# Patient Record
Sex: Female | Born: 1935 | Race: White | Hispanic: No | Smoking: Never smoker
Health system: Southern US, Community
[De-identification: ages and names within clinical notes are randomized; demographics above are authoritative.]

## PROBLEM LIST (undated history)

## (undated) DIAGNOSIS — S22000A Wedge compression fracture of unspecified thoracic vertebra, initial encounter for closed fracture: Secondary | ICD-10-CM

## (undated) DIAGNOSIS — F0390 Unspecified dementia without behavioral disturbance: Secondary | ICD-10-CM

## (undated) DIAGNOSIS — M546 Pain in thoracic spine: Secondary | ICD-10-CM

## (undated) DIAGNOSIS — N390 Urinary tract infection, site not specified: Secondary | ICD-10-CM

## (undated) DIAGNOSIS — S8010XA Contusion of unspecified lower leg, initial encounter: Secondary | ICD-10-CM

## (undated) DIAGNOSIS — E785 Hyperlipidemia, unspecified: Secondary | ICD-10-CM

## (undated) DIAGNOSIS — J189 Pneumonia, unspecified organism: Secondary | ICD-10-CM

## (undated) DIAGNOSIS — T148XXA Other injury of unspecified body region, initial encounter: Secondary | ICD-10-CM

## (undated) DIAGNOSIS — J449 Chronic obstructive pulmonary disease, unspecified: Secondary | ICD-10-CM

## (undated) DIAGNOSIS — I10 Essential (primary) hypertension: Secondary | ICD-10-CM

## (undated) DIAGNOSIS — R0789 Other chest pain: Secondary | ICD-10-CM

## (undated) DIAGNOSIS — J90 Pleural effusion, not elsewhere classified: Secondary | ICD-10-CM

## (undated) DIAGNOSIS — R569 Unspecified convulsions: Secondary | ICD-10-CM

## (undated) DIAGNOSIS — C4490 Unspecified malignant neoplasm of skin, unspecified: Secondary | ICD-10-CM

## (undated) DIAGNOSIS — M1990 Unspecified osteoarthritis, unspecified site: Secondary | ICD-10-CM

## (undated) DIAGNOSIS — D649 Anemia, unspecified: Secondary | ICD-10-CM

## (undated) DIAGNOSIS — M4316 Spondylolisthesis, lumbar region: Secondary | ICD-10-CM

## (undated) DIAGNOSIS — G40909 Epilepsy, unspecified, not intractable, without status epilepticus: Secondary | ICD-10-CM

## (undated) HISTORY — DX: Contusion of unspecified lower leg, initial encounter: S80.10XA

## (undated) HISTORY — DX: Other injury of unspecified body region, initial encounter: T14.8XXA

## (undated) HISTORY — PX: GLAUCOMA SURGERY: SHX656

## (undated) HISTORY — DX: Urinary tract infection, site not specified: N39.0

## (undated) HISTORY — DX: Spondylolisthesis, lumbar region: M43.16

## (undated) HISTORY — DX: Epilepsy, unspecified, not intractable, without status epilepticus: G40.909

## (undated) HISTORY — DX: Pneumonia, unspecified organism: J18.9

## (undated) HISTORY — DX: Essential (primary) hypertension: I10

## (undated) HISTORY — PX: LUMBAR DISC SURGERY: SHX700

## (undated) HISTORY — PX: ABDOMINAL HYSTERECTOMY: SHX81

## (undated) HISTORY — PX: EYE SURGERY: SHX253

## (undated) HISTORY — PX: BREAST LUMPECTOMY: SHX2

## (undated) HISTORY — DX: Anemia, unspecified: D64.9

## (undated) HISTORY — DX: Hyperlipidemia, unspecified: E78.5

## (undated) HISTORY — PX: CATARACT EXTRACTION W/ INTRAOCULAR LENS IMPLANT: SHX1309

## (undated) HISTORY — DX: Unspecified malignant neoplasm of skin, unspecified: C44.90

---

## 2004-12-26 ENCOUNTER — Encounter: Admission: RE | Admit: 2004-12-26 | Discharge: 2004-12-26 | Payer: Self-pay | Admitting: Specialist

## 2004-12-27 ENCOUNTER — Encounter: Admission: RE | Admit: 2004-12-27 | Discharge: 2004-12-27 | Payer: Self-pay | Admitting: Urology

## 2004-12-30 ENCOUNTER — Ambulatory Visit (HOSPITAL_BASED_OUTPATIENT_CLINIC_OR_DEPARTMENT_OTHER): Admission: RE | Admit: 2004-12-30 | Discharge: 2004-12-30 | Payer: Self-pay | Admitting: Urology

## 2004-12-30 ENCOUNTER — Encounter (INDEPENDENT_AMBULATORY_CARE_PROVIDER_SITE_OTHER): Payer: Self-pay | Admitting: Specialist

## 2004-12-30 ENCOUNTER — Ambulatory Visit (HOSPITAL_COMMUNITY): Admission: RE | Admit: 2004-12-30 | Discharge: 2004-12-30 | Payer: Self-pay | Admitting: Urology

## 2006-10-04 ENCOUNTER — Encounter: Admission: RE | Admit: 2006-10-04 | Discharge: 2006-10-04 | Payer: Self-pay | Admitting: Orthopaedic Surgery

## 2007-05-05 ENCOUNTER — Encounter: Admission: RE | Admit: 2007-05-05 | Discharge: 2007-05-05 | Payer: Self-pay | Admitting: Orthopedic Surgery

## 2007-06-09 ENCOUNTER — Emergency Department (HOSPITAL_COMMUNITY): Admission: EM | Admit: 2007-06-09 | Discharge: 2007-06-09 | Payer: Self-pay | Admitting: Family Medicine

## 2008-07-16 ENCOUNTER — Inpatient Hospital Stay (HOSPITAL_COMMUNITY): Admission: EM | Admit: 2008-07-16 | Discharge: 2008-07-21 | Payer: Self-pay | Admitting: Emergency Medicine

## 2008-07-16 ENCOUNTER — Encounter: Payer: Self-pay | Admitting: Internal Medicine

## 2008-07-16 ENCOUNTER — Ambulatory Visit: Payer: Self-pay | Admitting: Internal Medicine

## 2008-07-16 ENCOUNTER — Ambulatory Visit: Payer: Self-pay | Admitting: Cardiovascular Disease

## 2008-07-17 ENCOUNTER — Encounter: Payer: Self-pay | Admitting: Internal Medicine

## 2008-07-17 ENCOUNTER — Ambulatory Visit: Payer: Self-pay | Admitting: Vascular Surgery

## 2008-07-17 ENCOUNTER — Encounter (INDEPENDENT_AMBULATORY_CARE_PROVIDER_SITE_OTHER): Payer: Self-pay | Admitting: Internal Medicine

## 2008-07-20 ENCOUNTER — Encounter: Payer: Self-pay | Admitting: Internal Medicine

## 2008-07-20 ENCOUNTER — Telehealth: Payer: Self-pay | Admitting: Internal Medicine

## 2008-07-21 ENCOUNTER — Encounter: Payer: Self-pay | Admitting: Internal Medicine

## 2008-07-23 ENCOUNTER — Ambulatory Visit: Payer: Self-pay | Admitting: Internal Medicine

## 2008-07-23 DIAGNOSIS — Z862 Personal history of diseases of the blood and blood-forming organs and certain disorders involving the immune mechanism: Secondary | ICD-10-CM

## 2008-07-23 DIAGNOSIS — E785 Hyperlipidemia, unspecified: Secondary | ICD-10-CM

## 2008-07-23 DIAGNOSIS — J13 Pneumonia due to Streptococcus pneumoniae: Secondary | ICD-10-CM | POA: Insufficient documentation

## 2008-07-23 DIAGNOSIS — I10 Essential (primary) hypertension: Secondary | ICD-10-CM | POA: Insufficient documentation

## 2008-07-23 DIAGNOSIS — Z8709 Personal history of other diseases of the respiratory system: Secondary | ICD-10-CM | POA: Insufficient documentation

## 2008-07-23 DIAGNOSIS — R32 Unspecified urinary incontinence: Secondary | ICD-10-CM

## 2008-07-23 DIAGNOSIS — M129 Arthropathy, unspecified: Secondary | ICD-10-CM | POA: Insufficient documentation

## 2008-07-23 DIAGNOSIS — N39498 Other specified urinary incontinence: Secondary | ICD-10-CM | POA: Insufficient documentation

## 2008-07-23 DIAGNOSIS — R569 Unspecified convulsions: Secondary | ICD-10-CM

## 2008-07-23 DIAGNOSIS — Z87448 Personal history of other diseases of urinary system: Secondary | ICD-10-CM | POA: Insufficient documentation

## 2008-07-23 DIAGNOSIS — H409 Unspecified glaucoma: Secondary | ICD-10-CM | POA: Insufficient documentation

## 2008-07-23 DIAGNOSIS — K224 Dyskinesia of esophagus: Secondary | ICD-10-CM | POA: Insufficient documentation

## 2008-07-23 DIAGNOSIS — M810 Age-related osteoporosis without current pathological fracture: Secondary | ICD-10-CM | POA: Insufficient documentation

## 2008-07-23 DIAGNOSIS — D649 Anemia, unspecified: Secondary | ICD-10-CM | POA: Insufficient documentation

## 2008-07-23 DIAGNOSIS — F068 Other specified mental disorders due to known physiological condition: Secondary | ICD-10-CM

## 2008-07-27 LAB — CONVERTED CEMR LAB
BUN: 15 mg/dL (ref 6–23)
Basophils Absolute: 0 10*3/uL (ref 0.0–0.1)
Basophils Relative: 0.1 % (ref 0.0–3.0)
CO2: 30 meq/L (ref 19–32)
Calcium: 9.5 mg/dL (ref 8.4–10.5)
Eosinophils Relative: 1.9 % (ref 0.0–5.0)
GFR calc Af Amer: 150 mL/min
Glucose, Bld: 73 mg/dL (ref 70–99)
Hemoglobin: 11.9 g/dL — ABNORMAL LOW (ref 12.0–15.0)
Lymphocytes Relative: 10.9 % — ABNORMAL LOW (ref 12.0–46.0)
Monocytes Absolute: 0.4 10*3/uL (ref 0.1–1.0)
Neutrophils Relative %: 81 % — ABNORMAL HIGH (ref 43.0–77.0)
Platelets: 262 10*3/uL (ref 150–400)
Potassium: 3.8 meq/L (ref 3.5–5.1)
RDW: 13.8 % (ref 11.5–14.6)
Sodium: 135 meq/L (ref 135–145)

## 2008-08-04 ENCOUNTER — Encounter: Payer: Self-pay | Admitting: Internal Medicine

## 2008-08-05 ENCOUNTER — Encounter: Payer: Self-pay | Admitting: Internal Medicine

## 2008-08-06 ENCOUNTER — Ambulatory Visit: Payer: Self-pay | Admitting: Internal Medicine

## 2008-08-19 ENCOUNTER — Ambulatory Visit: Payer: Self-pay | Admitting: Internal Medicine

## 2008-08-31 ENCOUNTER — Encounter: Payer: Self-pay | Admitting: Internal Medicine

## 2008-09-01 ENCOUNTER — Encounter: Payer: Self-pay | Admitting: Internal Medicine

## 2008-09-01 ENCOUNTER — Telehealth: Payer: Self-pay | Admitting: Internal Medicine

## 2008-09-03 ENCOUNTER — Ambulatory Visit: Payer: Self-pay | Admitting: Internal Medicine

## 2008-09-15 ENCOUNTER — Telehealth: Payer: Self-pay | Admitting: *Deleted

## 2008-09-23 ENCOUNTER — Encounter: Payer: Self-pay | Admitting: Internal Medicine

## 2008-10-01 ENCOUNTER — Ambulatory Visit: Payer: Self-pay | Admitting: Internal Medicine

## 2008-10-21 ENCOUNTER — Ambulatory Visit: Payer: Self-pay | Admitting: Internal Medicine

## 2008-10-21 DIAGNOSIS — L259 Unspecified contact dermatitis, unspecified cause: Secondary | ICD-10-CM

## 2008-10-21 DIAGNOSIS — J310 Chronic rhinitis: Secondary | ICD-10-CM | POA: Insufficient documentation

## 2008-11-03 ENCOUNTER — Ambulatory Visit: Payer: Self-pay | Admitting: Internal Medicine

## 2008-11-03 ENCOUNTER — Telehealth: Payer: Self-pay | Admitting: Internal Medicine

## 2008-11-03 DIAGNOSIS — R05 Cough: Secondary | ICD-10-CM

## 2008-11-03 DIAGNOSIS — R509 Fever, unspecified: Secondary | ICD-10-CM

## 2008-11-27 ENCOUNTER — Telehealth: Payer: Self-pay | Admitting: Internal Medicine

## 2008-11-27 ENCOUNTER — Ambulatory Visit: Payer: Self-pay | Admitting: Internal Medicine

## 2008-11-27 LAB — CONVERTED CEMR LAB
Nitrite: NEGATIVE
Specific Gravity, Urine: 1.01

## 2008-11-28 ENCOUNTER — Encounter: Payer: Self-pay | Admitting: Internal Medicine

## 2008-12-10 ENCOUNTER — Telehealth: Payer: Self-pay | Admitting: Internal Medicine

## 2008-12-10 ENCOUNTER — Ambulatory Visit: Payer: Self-pay | Admitting: Internal Medicine

## 2008-12-10 DIAGNOSIS — N390 Urinary tract infection, site not specified: Secondary | ICD-10-CM

## 2008-12-10 DIAGNOSIS — R300 Dysuria: Secondary | ICD-10-CM | POA: Insufficient documentation

## 2008-12-10 LAB — CONVERTED CEMR LAB
Nitrite: NEGATIVE
Urobilinogen, UA: 0.2

## 2008-12-11 ENCOUNTER — Encounter: Payer: Self-pay | Admitting: Internal Medicine

## 2008-12-17 ENCOUNTER — Telehealth (INDEPENDENT_AMBULATORY_CARE_PROVIDER_SITE_OTHER): Payer: Self-pay | Admitting: *Deleted

## 2009-01-19 ENCOUNTER — Ambulatory Visit: Payer: Self-pay | Admitting: Internal Medicine

## 2009-01-19 DIAGNOSIS — M549 Dorsalgia, unspecified: Secondary | ICD-10-CM | POA: Insufficient documentation

## 2009-01-19 DIAGNOSIS — G479 Sleep disorder, unspecified: Secondary | ICD-10-CM | POA: Insufficient documentation

## 2009-01-19 LAB — CONVERTED CEMR LAB
Bilirubin Urine: NEGATIVE
Protein, U semiquant: NEGATIVE
Urobilinogen, UA: 0.2
WBC Urine, dipstick: NEGATIVE

## 2009-01-20 ENCOUNTER — Encounter: Payer: Self-pay | Admitting: Internal Medicine

## 2009-01-25 ENCOUNTER — Encounter: Admission: RE | Admit: 2009-01-25 | Discharge: 2009-01-25 | Payer: Self-pay | Admitting: Orthopedic Surgery

## 2009-01-25 ENCOUNTER — Encounter: Payer: Self-pay | Admitting: Internal Medicine

## 2009-01-27 ENCOUNTER — Ambulatory Visit: Payer: Self-pay | Admitting: Internal Medicine

## 2009-01-27 LAB — CONVERTED CEMR LAB
BUN: 17 mg/dL (ref 6–23)
CO2: 30 meq/L (ref 19–32)
GFR calc non Af Amer: 100.26 mL/min (ref >60)
Glucose, Bld: 110 mg/dL — ABNORMAL HIGH (ref 70–99)
Potassium: 3.8 meq/L (ref 3.5–5.1)

## 2009-01-28 ENCOUNTER — Ambulatory Visit: Payer: Self-pay | Admitting: Cardiovascular Disease

## 2009-01-28 ENCOUNTER — Encounter: Payer: Self-pay | Admitting: Internal Medicine

## 2009-02-04 ENCOUNTER — Encounter: Admission: RE | Admit: 2009-02-04 | Discharge: 2009-02-04 | Payer: Self-pay | Admitting: Orthopedic Surgery

## 2009-02-22 ENCOUNTER — Telehealth: Payer: Self-pay | Admitting: *Deleted

## 2009-03-09 ENCOUNTER — Telehealth (INDEPENDENT_AMBULATORY_CARE_PROVIDER_SITE_OTHER): Payer: Self-pay | Admitting: *Deleted

## 2009-03-11 ENCOUNTER — Telehealth: Payer: Self-pay | Admitting: Internal Medicine

## 2009-03-15 ENCOUNTER — Telehealth: Payer: Self-pay | Admitting: Internal Medicine

## 2009-03-17 ENCOUNTER — Encounter: Payer: Self-pay | Admitting: Internal Medicine

## 2009-03-25 ENCOUNTER — Encounter: Admission: RE | Admit: 2009-03-25 | Discharge: 2009-03-25 | Payer: Self-pay | Admitting: Orthopedic Surgery

## 2009-03-30 ENCOUNTER — Encounter: Admission: RE | Admit: 2009-03-30 | Discharge: 2009-03-30 | Payer: Self-pay | Admitting: Orthopaedic Surgery

## 2009-03-31 ENCOUNTER — Encounter: Admission: RE | Admit: 2009-03-31 | Discharge: 2009-03-31 | Payer: Self-pay | Admitting: Orthopaedic Surgery

## 2009-04-05 ENCOUNTER — Encounter: Admission: RE | Admit: 2009-04-05 | Discharge: 2009-04-05 | Payer: Self-pay | Admitting: Orthopaedic Surgery

## 2009-04-12 ENCOUNTER — Telehealth: Payer: Self-pay | Admitting: *Deleted

## 2009-06-08 ENCOUNTER — Telehealth: Payer: Self-pay | Admitting: *Deleted

## 2009-06-14 ENCOUNTER — Telehealth: Payer: Self-pay | Admitting: *Deleted

## 2009-06-30 ENCOUNTER — Encounter: Payer: Self-pay | Admitting: Internal Medicine

## 2009-07-01 ENCOUNTER — Ambulatory Visit: Payer: Self-pay | Admitting: Internal Medicine

## 2009-07-23 ENCOUNTER — Telehealth: Payer: Self-pay | Admitting: Internal Medicine

## 2009-08-09 ENCOUNTER — Telehealth: Payer: Self-pay | Admitting: *Deleted

## 2009-09-27 ENCOUNTER — Ambulatory Visit: Payer: Self-pay | Admitting: Internal Medicine

## 2009-09-27 DIAGNOSIS — H6120 Impacted cerumen, unspecified ear: Secondary | ICD-10-CM

## 2009-10-04 ENCOUNTER — Telehealth: Payer: Self-pay | Admitting: *Deleted

## 2009-10-06 ENCOUNTER — Ambulatory Visit: Payer: Self-pay | Admitting: Internal Medicine

## 2009-10-06 LAB — CONVERTED CEMR LAB
Nitrite: NEGATIVE
Specific Gravity, Urine: 1.015
Urobilinogen, UA: 0.2
WBC Urine, dipstick: NEGATIVE

## 2009-10-07 ENCOUNTER — Encounter: Payer: Self-pay | Admitting: Internal Medicine

## 2009-11-05 IMAGING — CR DG CHEST 2V
2 series · 2 of 2 positions shown · non-contrast
Comparison: 07/17/2008.

CLINICAL DATA: Seizure.  Cough.

CHEST - 2 VIEW

[w chest pa]
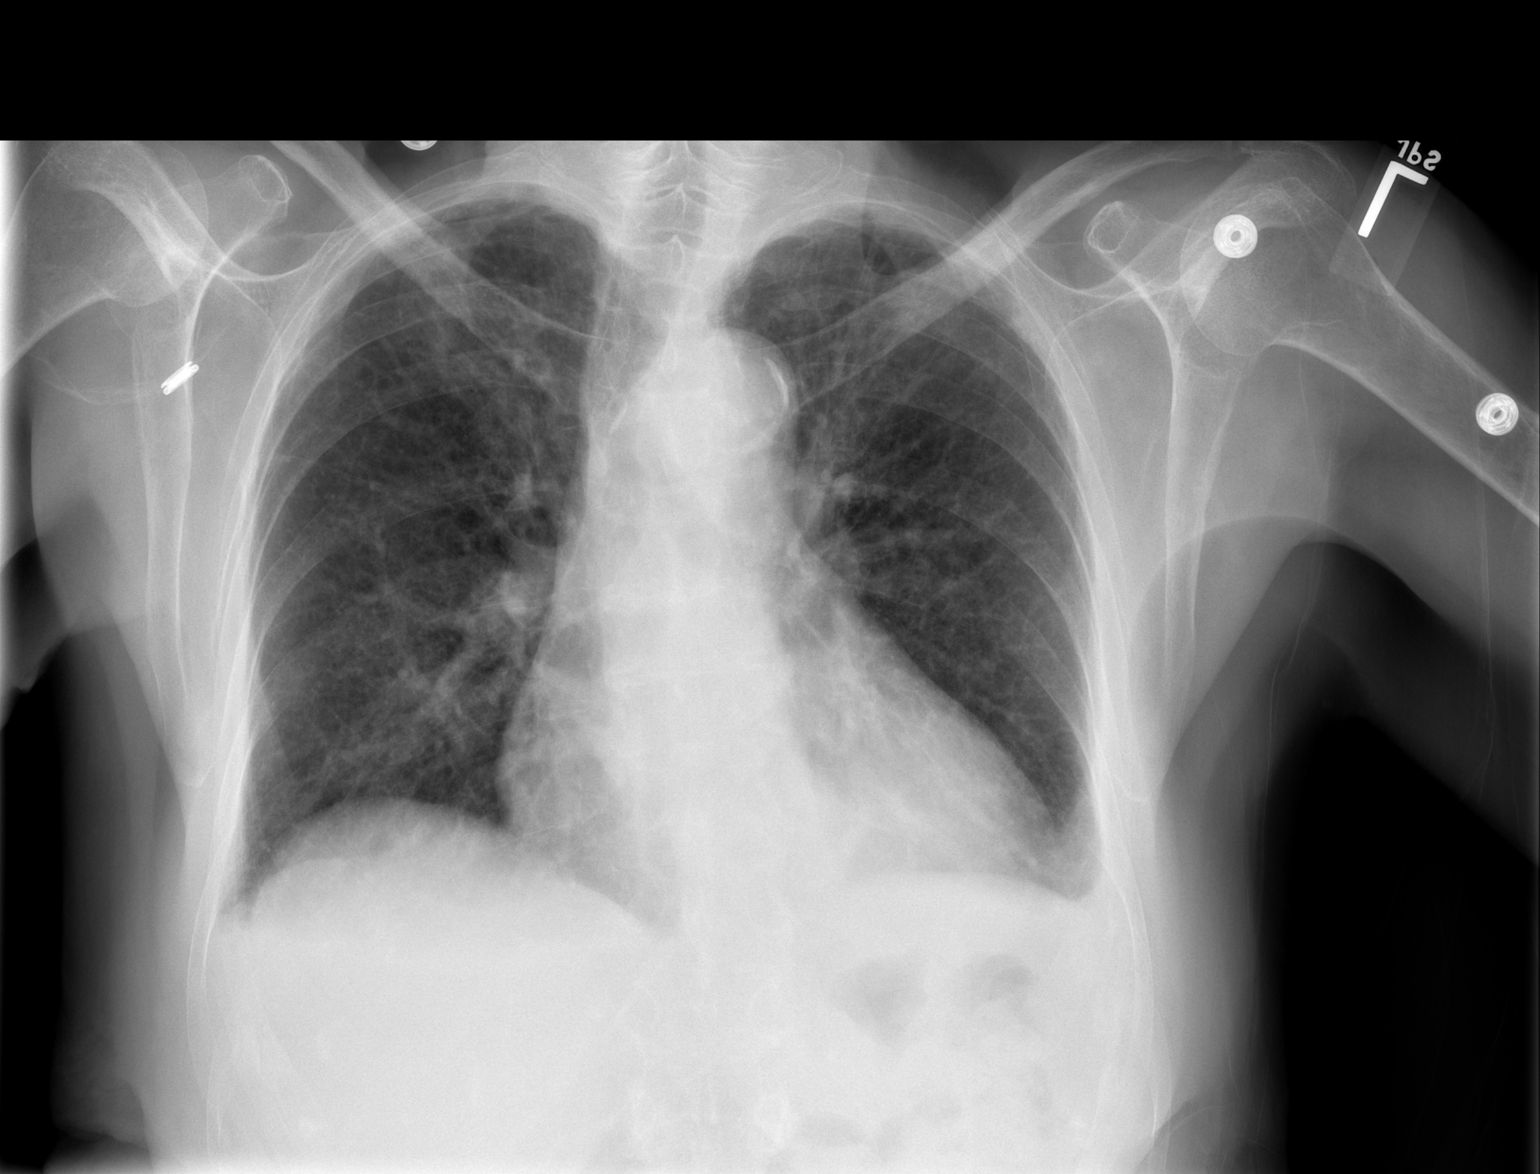

[w chest lat]
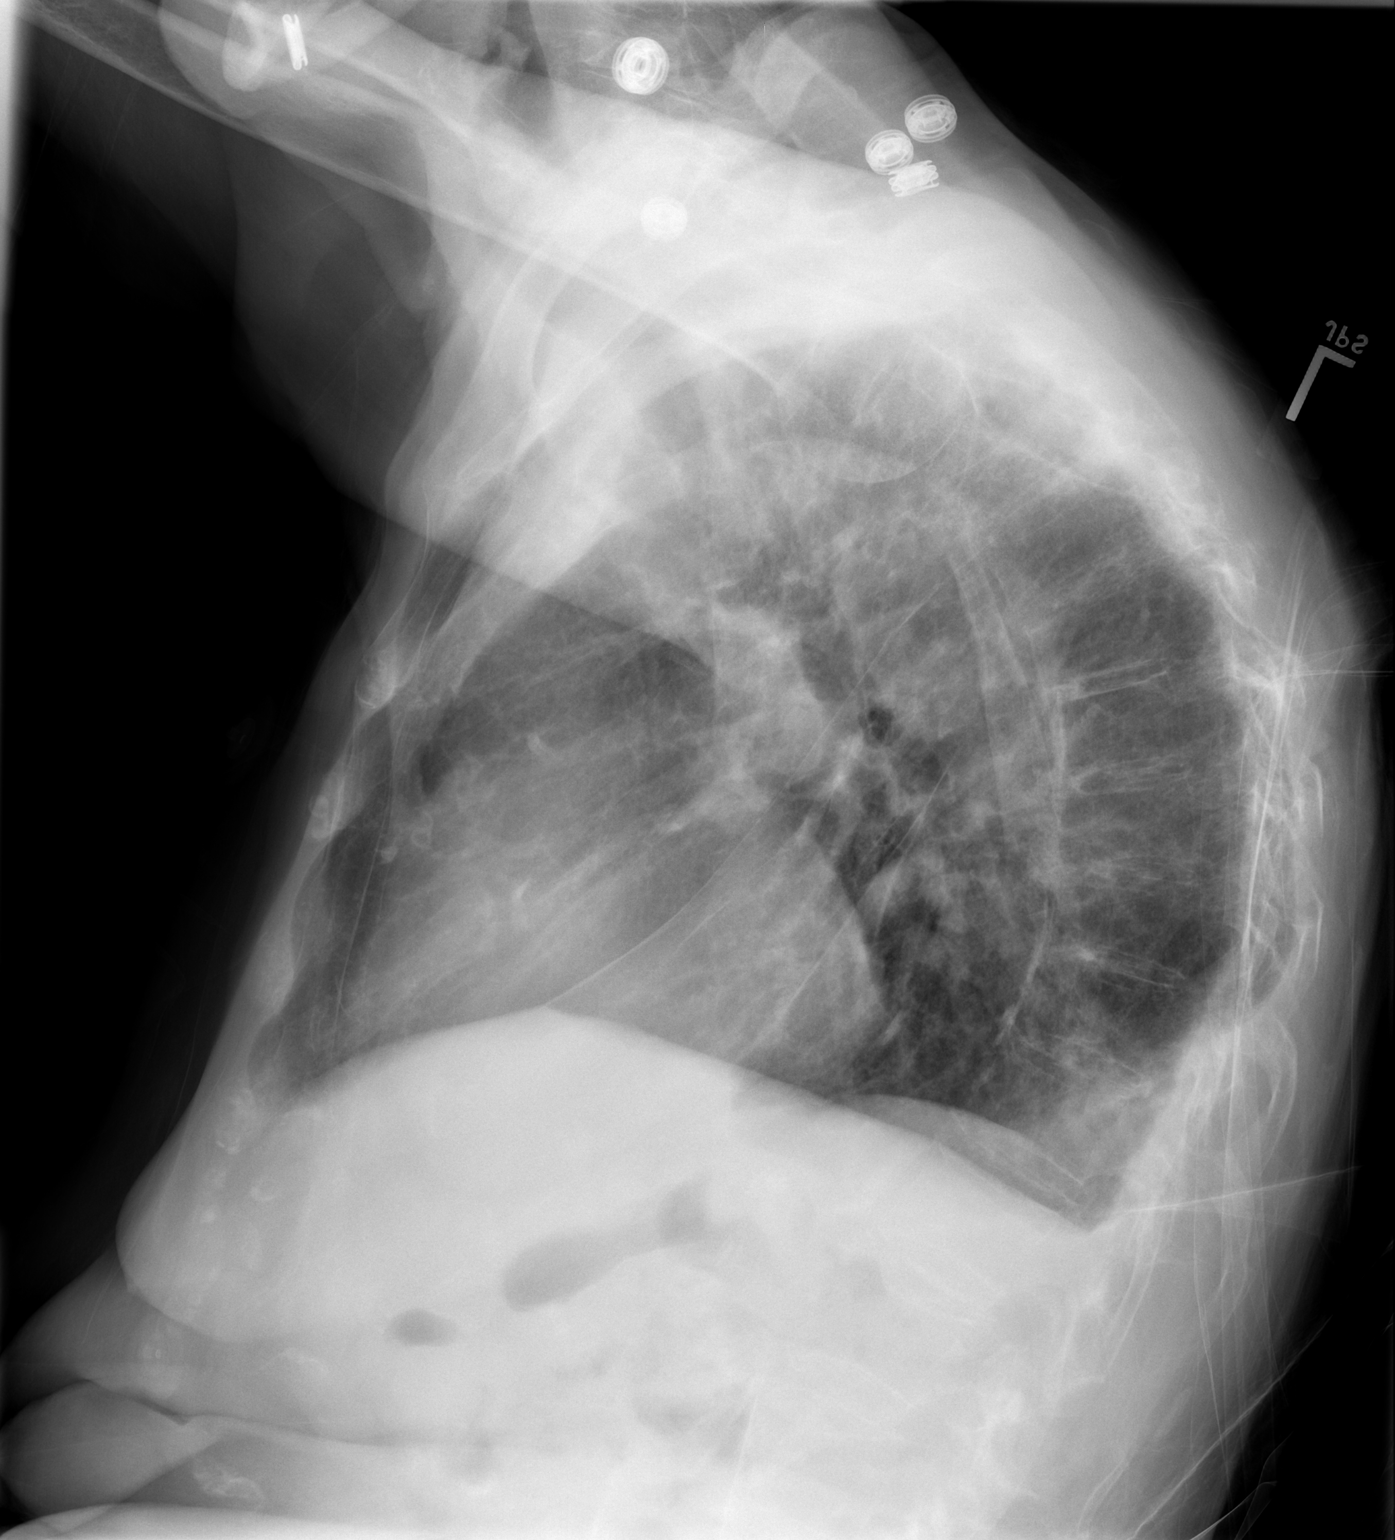

[2 of 2 positions shown; findings below may reference images not displayed]

FINDINGS: The heart is enlarged.  There is no edema.  There are
small bilateral pleural effusions.  There is mild left lower lobe
atelectasis.  Upper lobe scarring is unchanged.
IMPRESSION: Cardiac enlargement with small pleural effusions are unchanged.
There is no edema.

## 2010-09-04 DIAGNOSIS — C4490 Unspecified malignant neoplasm of skin, unspecified: Secondary | ICD-10-CM

## 2010-09-04 HISTORY — DX: Unspecified malignant neoplasm of skin, unspecified: C44.90

## 2010-09-04 HISTORY — PX: SKIN CANCER EXCISION: SHX779

## 2010-09-06 ENCOUNTER — Telehealth: Payer: Self-pay | Admitting: Internal Medicine

## 2010-09-06 ENCOUNTER — Ambulatory Visit
Admission: RE | Admit: 2010-09-06 | Discharge: 2010-09-06 | Payer: Self-pay | Source: Home / Self Care | Attending: Internal Medicine | Admitting: Internal Medicine

## 2010-09-06 DIAGNOSIS — J189 Pneumonia, unspecified organism: Secondary | ICD-10-CM | POA: Insufficient documentation

## 2010-09-07 ENCOUNTER — Ambulatory Visit
Admission: RE | Admit: 2010-09-07 | Discharge: 2010-09-07 | Payer: Self-pay | Source: Home / Self Care | Attending: Internal Medicine | Admitting: Internal Medicine

## 2010-09-19 ENCOUNTER — Telehealth: Payer: Self-pay | Admitting: *Deleted

## 2010-09-25 ENCOUNTER — Encounter: Payer: Self-pay | Admitting: Specialist

## 2010-09-26 ENCOUNTER — Encounter: Payer: Self-pay | Admitting: Orthopaedic Surgery

## 2010-10-02 LAB — CONVERTED CEMR LAB
Albumin: 3.4 g/dL — ABNORMAL LOW (ref 3.5–5.2)
BUN: 19 mg/dL (ref 6–23)
Basophils Absolute: 0 10*3/uL (ref 0.0–0.1)
Bilirubin, Direct: 0.1 mg/dL (ref 0.0–0.3)
CRP, High Sensitivity: 3 (ref 0.00–5.00)
Eosinophils Absolute: 0.1 10*3/uL (ref 0.0–0.7)
Ferritin: 158.1 ng/mL (ref 10.0–291.0)
Glucose, Bld: 105 mg/dL — ABNORMAL HIGH (ref 70–99)
HCT: 35.3 % — ABNORMAL LOW (ref 36.0–46.0)
Hemoglobin: 12 g/dL (ref 12.0–15.0)
MCV: 92.1 fL (ref 78.0–100.0)
Neutrophils Relative %: 74.7 % (ref 43.0–77.0)
Platelets: 270 10*3/uL (ref 150–400)
Potassium: 3.6 meq/L (ref 3.5–5.1)
RDW: 16.5 % — ABNORMAL HIGH (ref 11.5–14.6)
Total Protein: 7.1 g/dL (ref 6.0–8.3)
Vitamin B-12: 799 pg/mL (ref 211–911)

## 2010-10-04 NOTE — Progress Notes (Signed)
Summary: refills   Phone Note Call from Patient Call back at [phone]   Caller: daughter-[name] Summary of Call: Pt needs 2 weeks worth of Lunesta 2mg  and then mail order to  [facility] for alprazolam 0.5mg , crestor 10mg , aricept, boniva and lunesta.  Initial call taken by: Romualdo Bolk, CMA (AAMA),  October 04, 2009 5:08 PM  Follow-up for Phone Call        Rx's done. Follow-up by: Romualdo Bolk, CMA (AAMA),  October 06, 2009 1:32 PM

## 2010-10-04 NOTE — Assessment & Plan Note (Signed)
Summary: ears clogged/ssc   Vital Signs:  Patient profile:   77 year old female Menstrual status:  hysterectomy Weight:      128 pounds Temp:     98.0 degrees F oral Pulse rate:   72 / minute BP sitting:   110 / 70  (right arm) Cuff size:   regular  Vitals Entered By: Romualdo Bolk, CMA (AAMA) (September 27, 2009 1:22 PM) CC: Bilateral Ears Clogged   History of Present Illness: [name] comesin with daughter because of poss decreased hearing and wax in ears. has had this problem before.   No fever or other major changes in health status today.  Preventive Screening-Counseling & Management  Alcohol-Tobacco     Alcohol drinks/day: 0     Smoking Status: never  Caffeine-Diet-Exercise     Caffeine use/day: 0     Does Patient Exercise: no  Current Medications (verified): 1)  Crestor 10 Mg Tabs (Rosuvastatin Calcium) .Marland Kitchen.. 1 By Mouth Once Daily 2)  Adult Aspirin Ec Low Strength 81 Mg Tbec (Aspirin) 3)  Aricept 10 Mg Tabs (Donepezil Hcl) .Marland Kitchen.. 1 By Mouth Once Daily 4)  Alprazolam 0.5 Mg Tbdp (Alprazolam) .... One Daily At [facility] 5)  Boniva 150 Mg Tabs (Ibandronate Sodium) .... One Tab Monthly 6)  Urelle 81 Mg Tabs (Meth-Hyo-M Bl-Na Phos-Ph Sal) 7)  Lunesta 2 Mg Tabs (Eszopiclone) .Marland Kitchen.. 1 By Mouth Hs   For Sleep  Allergies (verified): 1)  ! Codeine 2)  ! Sulfa 3)  ! Diuretic ?  Past History:  Past medical, surgical, family and social histories (including risk factors) reviewed for relevance to current acute and chronic problems.  Past Medical History: Reviewed history from 11/03/2008 and no changes required. Anemia-NOS Hyperlipidemia Hypertension Seizure disorder  poss from hyponatremia when had pneumonia Pneumonia, hx of 1/04 Osteoporosis Recurrent UTI G4P4 pneumovax 2001 Colon ? never  Urinary incontinence mild   CONSULTANTS   Dohmeier SYpher  Past Surgical History: Reviewed history from 07/23/2008 and no changes required. Rt and Lf breast- had fibrofatty  tissue Memory Test conducted in 2002 and 2007 Grade II spondylotistesis at L4-:L5- Testing in early 2008 and confirmed that she has Osteoporosis  Family History: Reviewed history from 11/03/2008 and no changes required. Breast cancer   no seizures OA     Social History: Reviewed history from 12/10/2008 and no changes required. In the last year moved from [place] to Daughters home  HH of 4  Daughter a PT, son in law pulmonary physician NO etoh or tobacco       Physical Exam  General:  alert, well-developed, and well-hydrated.  elderly in nad cooperative  Head:  normocephalic and atraumatic.   Eyes:  vision grossly intact.   Ears:  eac left with wax  Lopiccolo   right  mod amt of wax tm grey irrigation  left eac  and manual removed some wax but some remains    . tolerated well  tm partly seen   Nose:  no external erythema.  clear discharge    Impression & Recommendations:  Problem # 1:  CERUMEN IMPACTION (ICD-380.4) incompete removal   after 15-20 minutes  rec  softening drops at night and then return in a week for reexamine and rx. Orders: Cerumen Impaction Removal ([address]109)  Complete Medication List: 1)  Crestor 10 Mg Tabs (Rosuvastatin calcium) .Marland Kitchen.. 1 by mouth once daily 2)  Adult Aspirin Ec Low Strength 81 Mg Tbec (Aspirin) 3)  Aricept 10 Mg Tabs (Donepezil hcl) .Marland KitchenMarland KitchenMarland Kitchen  1 by mouth once daily 4)  Alprazolam 0.5 Mg Tbdp (Alprazolam) .... One daily at hs 5)  Boniva 150 Mg Tabs (Ibandronate sodium) .... One tab monthly 6)  Urelle 81 Mg Tabs (Meth-hyo-m bl-na phos-ph sal) 7)  Lunesta 2 Mg Tabs (Eszopiclone) .Marland Kitchen.. 1 by mouth hs   for sleep  Patient Instructions: 1)  rov in 1 week or as needed.

## 2010-10-04 NOTE — Assessment & Plan Note (Signed)
Summary: EAR IRRIGATION PT TO COME IN AT 8:15AM/SSC  Nurse Visit  CC: Irrigated Rt ear with some success. Pt is also having a foul smelling urine. Collected ua and will do culture if abnormal.   History of Present Illness: [name] comesin with daughter for ear irrigation but alos has stong odor to urine.  has hx of recurrent uti . doesnt feel well today . some cough .   NO fever .  eating ok.  No sob .    Past History:  Past medical, surgical, family and social histories (including risk factors) reviewed for relevance to current acute and chronic problems.  Past Medical History: Reviewed history from 11/03/2008 and no changes required. Anemia-NOS Hyperlipidemia Hypertension Seizure disorder  poss from hyponatremia when had pneumonia Pneumonia, hx of 1/04 Osteoporosis Recurrent UTI G4P4 pneumovax 2001 Colon ? never  Urinary incontinence mild   CONSULTANTS   Dohmeier SYpher  Past Surgical History: Reviewed history from 07/23/2008 and no changes required. Rt and Lf breast- had fibrofatty tissue Memory Test conducted in 2002 and 2007 Grade II spondylotistesis at L4-:L5- Testing in early 2008 and confirmed that she has Osteoporosis   Physical Exam  General:  well-developed, well-nourished, and well-hydrated. alert   somewhat tired   Head:  normocephalic and atraumatic.   Ears:  left tm visualized some debri in canal  right tm not seen  and afer irrigation minimally seen  Nose:  no external erythema.  chronic clear discharge  Mouth:  pharynx pink and moist.   Neck:  No deformities, masses, or tenderness noted. Lungs:  Normal respiratory effort, chest expands symmetrically. Lungs are clear to auscultation, no crackles or wheezes. Skin:  turgor normal and color normal.   Cervical Nodes:  No lymphadenopathy noted Psych:  good eye contact.     Current Medications (verified): 1)  Crestor 10 Mg Tabs (Rosuvastatin Calcium) .Marland Kitchen.. 1 By Mouth Once Daily 2)  Adult Aspirin Ec  Low Strength 81 Mg Tbec (Aspirin) 3)  Aricept 10 Mg Tabs (Donepezil Hcl) .Marland Kitchen.. 1 By Mouth Once Daily 4)  Alprazolam 0.25 Mg Tabs (Alprazolam) .Marland Kitchen.. 1 By Mouth At Bedtime 5)  Boniva 150 Mg Tabs (Ibandronate Sodium) .... One Tab Monthly 6)  Urelle 81 Mg Tabs (Meth-Hyo-M Bl-Na Phos-Ph Sal) 7)  Lunesta 2 Mg Tabs (Eszopiclone) .Marland Kitchen.. 1 By Mouth Hs   For Sleep  Allergies (verified): 1)  ! Codeine 2)  ! Sulfa 3)  ! Diuretic ?  Impression & Recommendations:  Problem # 1:  CERUMEN IMPACTION (ICD-380.4) unable to get good clearance of canal    rec ent to check  Orders: ENT Referral (ENT)  Problem # 2:  UTI'S, RECURRENT (ICD-599.0) r/o infection   check today  call results  Her updated medication list for this problem includes:    Urelle 81 Mg Tabs (Meth-hyo-m bl-na phos-ph sal)  Orders: UA Dipstick w/o Micro (automated)  (81003) T-Culture, Urine ([id number])  Complete Medication List: 1)  Crestor 10 Mg Tabs (Rosuvastatin calcium) .Marland Kitchen.. 1 by mouth once daily 2)  Adult Aspirin Ec Low Strength 81 Mg Tbec (Aspirin) 3)  Aricept 10 Mg Tabs (Donepezil hcl) .Marland Kitchen.. 1 by mouth once daily 4)  Alprazolam 0.25 Mg Tabs (Alprazolam) .Marland Kitchen.. 1 by mouth at bedtime 5)  Boniva 150 Mg Tabs (Ibandronate sodium) .... One tab monthly 6)  Urelle 81 Mg Tabs (Meth-hyo-m bl-na phos-ph sal) 7)  Lunesta 2 Mg Tabs (Eszopiclone) .Marland Kitchen.. 1 by mouth hs   for sleep  Laboratory Results  Urine Tests    Routine Urinalysis   Color: yellow Appearance: Clear Glucose: negative   (Normal Range: Negative) Bilirubin: negative   (Normal Range: Negative) Ketone: negative   (Normal Range: Negative) Spec. Gravity: 1.015   (Normal Range: 1.003-1.035) Blood: 2+   (Normal Range: Negative) pH: 6.5   (Normal Range: 5.0-8.0) Protein: negative   (Normal Range: Negative) Urobilinogen: 0.2   (Normal Range: 0-1) Nitrite: negative   (Normal Range: Negative) Leukocyte Esterace: negative   (Normal Range: Negative)    Comments: Rita Ohara  October 06, 2009 2:33 PM     Orders Added: 1)  UA Dipstick w/o Micro (automated)  [81003] 2)  T-Culture, Urine [[id number]] 3)  ENT Referral [ENT] 4)  Est. Patient Level II [09811] Prescriptions: LUNESTA 2 MG TABS (ESZOPICLONE) 1 by mouth hs   for sleep  #90 x 1   Entered by:   Romualdo Bolk, CMA (AAMA)   Authorized by:   [name] MD   Signed by:   Romualdo Bolk, CMA (AAMA) on 10/07/2009   Method used:   Reprint   RxID:   [id number] BONIVA 150 MG TABS (IBANDRONATE SODIUM) one tab monthly  #3 x 3   Entered by:   Romualdo Bolk, CMA (AAMA)   Authorized by:   [name] MD   Signed by:   Romualdo Bolk, CMA (AAMA) on 10/07/2009   Method used:   Reprint   RxID:   [id number] ALPRAZOLAM 0.25 MG TABS (ALPRAZOLAM) 1 by mouth at bedtime  #90 x 1   Entered by:   Romualdo Bolk, CMA (AAMA)   Authorized by:   [name] MD   Signed by:   Romualdo Bolk, CMA (AAMA) on 10/07/2009   Method used:   Reprint   RxID:   [id number] ARICEPT 10 MG TABS (DONEPEZIL HCL) 1 by mouth once daily  #90 x 3   Entered by:   Romualdo Bolk, CMA (AAMA)   Authorized by:   [name] MD   Signed by:   Romualdo Bolk, CMA (AAMA) on 10/07/2009   Method used:   Reprint   RxID:   [id number] CRESTOR 10 MG TABS (ROSUVASTATIN CALCIUM) 1 by mouth once daily  #90 x 3   Entered by:   Romualdo Bolk, CMA (AAMA)   Authorized by:   [name] MD   Signed by:   Romualdo Bolk, CMA (AAMA) on 10/07/2009   Method used:   Reprint   RxID:   [id number] LUNESTA 2 MG TABS (ESZOPICLONE) 1 by mouth hs   for sleep  #14 x 0   Entered by:   Romualdo Bolk, CMA (AAMA)   Authorized by:   [name] MD   Signed by:   Romualdo Bolk, CMA (AAMA) on 10/06/2009   Method used:   Telephoned to ...       [facility] [address] [phone]* (retail)       [address]       [place], [place]  [address]        Ph: [phone] or [phone]       Fax: [phone]   RxID:   [id number] LUNESTA 2 MG TABS (ESZOPICLONE) 1 by mouth hs   for sleep  #90 x 1   Entered by:   Romualdo Bolk, CMA (AAMA)   Authorized by:  [name] MD   Signed by:   Romualdo Bolk, CMA (AAMA) on 10/06/2009   Method used:   Printed then faxed to ...       [facility]* (mail-order)             ,          Ph: [phone]       Fax: [phone]   RxID:   [id number] BONIVA 150 MG TABS (IBANDRONATE SODIUM) one tab monthly  #3 x 3   Entered by:   Romualdo Bolk, CMA (AAMA)   Authorized by:   [name] MD   Signed by:   Romualdo Bolk, CMA (AAMA) on 10/06/2009   Method used:   Printed then faxed to ...       [facility]* (mail-order)             ,          Ph: [phone]       Fax: [phone]   RxID:   [id number] ALPRAZOLAM 0.25 MG TABS (ALPRAZOLAM) 1 by mouth at bedtime  #90 x 1   Entered by:   Romualdo Bolk, CMA (AAMA)   Authorized by:   [name] MD   Signed by:   Romualdo Bolk, CMA (AAMA) on 10/06/2009   Method used:   Printed then faxed to ...       [facility]* (mail-order)             ,          Ph: [phone]       Fax: [phone]   RxID:   [id number] ARICEPT 10 MG TABS (DONEPEZIL HCL) 1 by mouth once daily  #90 x 3   Entered by:   Romualdo Bolk, CMA (AAMA)   Authorized by:   [name] MD   Signed by:   Romualdo Bolk, CMA (AAMA) on 10/06/2009   Method used:   Printed then faxed to ...       [facility]* (mail-order)             ,          Ph: [phone]       Fax: [phone]   RxID:   [id number] CRESTOR 10 MG TABS (ROSUVASTATIN CALCIUM) 1 by mouth once daily  #90 x 3   Entered by:   Romualdo Bolk, CMA (AAMA)   Authorized by:   [name] MD   Signed by:   Romualdo Bolk, CMA (AAMA) on 10/06/2009   Method used:   Printed then faxed to ...       [facility]* (mail-order)              ,          Ph: [phone]       Fax: [phone]   RxID:   [id number]  Had to reprint rx's because [facility] didn't accept the original ones that we faxed. I left a message for pt's daughter to come by and pick up the rx's to mail to [facility]. CII can't be faxed to [facility] anyway. I had forgotten about that and I had left a message for daughter about this as well. Romualdo Bolk, CMA (AAMA)  October 07, 2009 9:33 AM

## 2010-10-06 NOTE — Assessment & Plan Note (Signed)
Summary: follow up from hospital/ssc   Vital Signs:  Patient profile:   77 year old female Menstrual status:  hysterectomy Weight:      125 pounds O2 Sat:      97 % on Room air Temp:     98.2 degrees F oral Pulse rate:   93 / minute BP sitting:   140 / 80  (right arm) Cuff size:   regular  Vitals Entered By: Romualdo Bolk, CMA (AAMA) (September 07, 2010 4:08 PM)  O2 Flow:  Room air CC: Follow-up visit up from hospital   History of Present Illness: [name] comes in today  with daughter for above .  [facility]vd  and cramos felt to be GE  had wbc of 12 k .   was there for a day . then went back dec 23 -24 and nted to have  wbc of 14.k and lll pneumonia ? and uti with ecoli .   was given keflex  and came home with daughter from [place].    she had a cough and som sob.  given  z pack but son in law in the meantime.     some getting better .nausea today but goo appettie but still coughing and rhinorrhea ( chronic)  .   aughter brings dc instructions with her today.   Preventive Screening-Counseling & Management  Alcohol-Tobacco     Alcohol drinks/day: 0     Smoking Status: never  Caffeine-Diet-Exercise     Caffeine use/day: 0     Does Patient Exercise: no  Current Medications (verified): 1)  Crestor 10 Mg Tabs (Rosuvastatin Calcium) .Marland Kitchen.. 1 By Mouth Once Daily 2)  Adult Aspirin Ec Low Strength 81 Mg Tbec (Aspirin) 3)  Aricept 10 Mg Tabs (Donepezil Hcl) .Marland Kitchen.. 1 By Mouth Once Daily 4)  Alprazolam 0.25 Mg Tabs (Alprazolam) .Marland Kitchen.. 1 By Mouth At Bedtime 5)  Boniva 150 Mg Tabs (Ibandronate Sodium) .... One Tab Monthly 6)  Lunesta 2 Mg Tabs (Eszopiclone) .Marland Kitchen.. 1 By Mouth Hs   For Sleep 7)  Restasis 0.05 % Emul (Cyclosporine)  Allergies (verified): 1)  ! Codeine 2)  ! Sulfa 3)  ! Diuretic ? 4)  ! Neurontin (Gabapentin)  Past History:  Past Medical History: Anemia-NOS Hyperlipidemia Hypertension Seizure disorder  poss from hyponatremia when  had pneumonia Pneumonia, hx of 1/04 Osteoporosis Recurrent UTI G4P4 pneumovax 2001 Colon ? never  Urinary incontinence mild   [facility] and ? pneumonia [facility]   Dohmeier SYpher  Past History:  Care Management: Dr. Providence Lanius  Dr. Teressa Senter Dr. Davis-urology Neurosurgery: [facility]  Social History: In the last year moved from [place] to Daughters home  HH of 4  Daughter a PT, son in law pulmonary physician NO etoh or tobacco      [facility] [facility]   back here fo the holidays post hospitalization  Review of Systems  The patient denies anorexia, fever, weight loss, vision loss, hoarseness, syncope, melena, hematochezia, severe indigestion/heartburn, transient blindness, abnormal bleeding, enlarged lymph nodes, angioedema, and hematuria.    Physical Exam  General:  alert, well-developed, well-nourished, and well-hydrated.  intermittent cough and rhinorrhea  alert interactive  Head:  normocephalic and atraumatic.   Eyes:  no dc  Ears:  R ear normal and L ear normal.   Nose:  clear runny  Neck:  No deformities, masses, or tenderness noted. Chest  Wall:  kyphosis Lungs:  no accessory muscle use and no fremitus.  good air movement ? right base with tubular sounds.  Heart:  normal rate, regular rhythm, and no gallop.   Pulses:  nl cap refill   Neurologic:  alert   interactive   independent with walker  Skin:  turgor normal and color normal.   Cervical Nodes:  No lymphadenopathy noted Psych:  good eye contact.     Impression & Recommendations:  Problem # 1:  PNEUMONIA (ICD-486) hosp records refer to lll  are  and given iv rocephin and azithro while in hosp  and taking last day  of same of new z pack .  x ray yesterday showed small r pleural effusion.   Problem # 2:  UTI'S, RECURRENT (ICD-599.0) last  cx ecoli sensitive to keflex and has had 7 day of  this  The following medications were removed from the medication list:    Urelle 81 Mg  Tabs (Meth-hyo-m bl-na phos-ph sal)  Problem # 3:  DEMENTIA, MILD (ICD-294.8) continuing  no falls  Problem # 4:  HYPONATREMIA, HX OF (ICD-V12.3) see hosp records  133 range  related to illness  Complete Medication List: 1)  Crestor 10 Mg Tabs (Rosuvastatin calcium) .Marland Kitchen.. 1 by mouth once daily 2)  Adult Aspirin Ec Low Strength 81 Mg Tbec (Aspirin) 3)  Aricept 10 Mg Tabs (Donepezil hcl) .Marland Kitchen.. 1 by mouth once daily 4)  Alprazolam 0.25 Mg Tabs (Alprazolam) .Marland Kitchen.. 1 by mouth at bedtime 5)  Boniva 150 Mg Tabs (Ibandronate sodium) .... One tab monthly 6)  Lunesta 2 Mg Tabs (Eszopiclone) .Marland Kitchen.. 1 by mouth hs   for sleep 7)  Restasis 0.05 % Emul (Cyclosporine)  Patient Instructions: 1)  since doing better will observe and consider repeat x ray in 2 weeks  if  worse considerr repeating bmp  ua etc.  2)  onlast  day of zthro   Orders Added: 1)  Est. Patient Level IV [16109]

## 2010-10-06 NOTE — Progress Notes (Signed)
Summary: Request to be Worked [employer] F/U  Phone Note Call from Patient Call back at [phone]   Caller: Olena Leatherwood -Daughter Summary of Call: Pt recently released from hospital with pnuemonia, would like to be worked in some time this week before Dr. Fabian Sharp goes out. Initial call taken by: Trixie Dredge,  September 06, 2010 9:59 AM  Follow-up for Phone Call        Nothing in e-chart- Pt's daughter states that she was hospitalized in [place]. DX when UTI and low potassium. Dr. Maple Hudson said that she had pnuemonia and put her on a z-pac on 12/31. Daughter has records from both hospital 12/18 and 12/21. Pt is doing well. Follow-up by: Romualdo Bolk, CMA (AAMA),  September 06, 2010 1:56 PM  Additional Follow-up for Phone Call Additional follow up Details #1::        Order placed for CXR. Additional Follow-up by: Romualdo Bolk, CMA (AAMA),  September 06, 2010 2:22 PM  New Problems: PNEUMONIA (ICD-486)   Additional Follow-up for Phone Call Additional follow up Details #2::    wil see her tomorrow at 4 Follow-up by: [name] MD,  September 06, 2010 5:45 PM  New Problems: PNEUMONIA (ICD-486)

## 2010-10-06 NOTE — Progress Notes (Signed)
Summary: Pt has changed from [facility] to [facility] need refills  Phone Note Refill Request Call back at [phone] [facility] cell Message from:  daughter - Dia Crawford   Refills Requested: Medication #1:  ALPRAZOLAM 0.25 MG TABS 1 by mouth at bedtime   Dosage confirmed as above?Dosage Confirmed   Supply Requested: 3 months  Medication #2:  LUNESTA 2 MG TABS 1 by mouth hs   for sleep   Dosage confirmed as above?Dosage Confirmed   Supply Requested: 3 months  Medication #3:  CRESTOR 10 MG TABS 1 by mouth once daily   Dosage confirmed as above?Dosage Confirmed   Supply Requested: 3 months Pt has changed from [facility] to [facility] mail order. Pls fax to [phone].      Method Requested: [facility] to [facility]  Fax [phone] Initial call taken by: Lucy Antigua,  September 19, 2010 11:52 AM  Follow-up for Phone Call        ok to change meds  to above pharmacy   90 days for lunesta and alprazolam  6 months for the crestor. Follow-up by: [name] MD,  September 19, 2010 12:48 PM  Additional Follow-up for Phone Call Additional follow up Details #1::        I had to re-shread the rx's already printed out because they were the wrong ones. Rx's re-printed again for correct qty and refills then faxed to pharmacy. Additional Follow-up by: Romualdo Bolk, CMA (AAMA),  September 19, 2010 3:18 PM    Prescriptions: LUNESTA 2 MG TABS (ESZOPICLONE) 1 by mouth hs   for sleep  #90 x 0   Entered by:   Romualdo Bolk, CMA (AAMA)   Authorized by:   [name] MD   Signed by:   Romualdo Bolk, CMA (AAMA) on 09/19/2010   Method used:   Printed then faxed to ...       [facility]order[facility]       [address] [place], [place]  [address]       Ph: [phone]       Fax: [phone]   RxID:   [id number] ALPRAZOLAM 0.25 MG TABS (ALPRAZOLAM) 1 by mouth at bedtime  #90 x 0   Entered by:   Romualdo Bolk, CMA (AAMA)   Authorized by:   [name] MD   Signed by:    Romualdo Bolk, CMA (AAMA) on 09/19/2010   Method used:   Printed then faxed to ...       [facility] (mail[facility]order)       [address] [place], [place]  [address]       Ph: [phone]       Fax: [phone]   RxID:   [id number] CRESTOR 10 MG TABS (ROSUVASTATIN CALCIUM) 1 by mouth once daily  #90 x 1   Entered by:   Romualdo Bolk, CMA (AAMA)   Authorized by:   [name] MD   Signed by:   Romualdo Bolk, CMA (AAMA) on 09/19/2010   Method used:   Printed then faxed to ...       [facility] (mail-order)       [address] [place], [place]  [address]       Ph: [phone]       Fax: [phone]   RxID:   [id number] LUNESTA 2 MG  TABS (ESZOPICLONE) 1 by mouth hs   for sleep  #90 x 1   Entered by:   Romualdo Bolk, CMA (AAMA)   Authorized by:   [name] MD   Signed by:   Romualdo Bolk, CMA (AAMA) on 09/19/2010   Method used:   Printed then faxed to ...       [facility] (mail-order)       [address] [place], [place]  [address]       Ph: [phone]       Fax: [phone]   RxID:   [id number] ALPRAZOLAM 0.25 MG TABS (ALPRAZOLAM) 1 by mouth at bedtime  #90 x 1   Entered by:   Romualdo Bolk, CMA (AAMA)   Authorized by:   [name] MD   Signed by:   Romualdo Bolk, CMA (AAMA) on 09/19/2010   Method used:   Printed then faxed to ...       [facility] (mail-order)       [address] [place], [place]  [address]       Ph: [phone]       Fax: [phone]   RxID:   [id number] CRESTOR 10 MG TABS (ROSUVASTATIN CALCIUM) 1 by mouth once daily  #90 x 3   Entered by:   Romualdo Bolk, CMA (AAMA)   Authorized by:   [name] MD   Signed by:   Romualdo Bolk, CMA (AAMA) on 09/19/2010   Method used:   Printed then faxed to ...       [facility] (mail-order)       [address] [place], [place]  [address]       Ph: [phone]       Fax: [phone]   RxID:   [id number]

## 2010-10-18 ENCOUNTER — Telehealth: Payer: Self-pay | Admitting: *Deleted

## 2010-10-18 NOTE — Telephone Encounter (Signed)
Would like to speak to [facility] about her Mom's DNR forms, and some forms to be filled out for Day Care.

## 2010-10-18 NOTE — Telephone Encounter (Signed)
Left message on machine that she can drop off the forms and I would give them to Dr. Fabian Sharp

## 2010-11-05 ENCOUNTER — Encounter: Payer: Self-pay | Admitting: Internal Medicine

## 2010-11-08 ENCOUNTER — Encounter: Payer: Self-pay | Admitting: Internal Medicine

## 2010-11-08 ENCOUNTER — Ambulatory Visit (INDEPENDENT_AMBULATORY_CARE_PROVIDER_SITE_OTHER): Payer: Medicare Other | Admitting: Internal Medicine

## 2010-11-08 VITALS — BP 120/80 | HR 66 | Wt 126.0 lb

## 2010-11-08 DIAGNOSIS — M4316 Spondylolisthesis, lumbar region: Secondary | ICD-10-CM | POA: Insufficient documentation

## 2010-11-08 DIAGNOSIS — H04123 Dry eye syndrome of bilateral lacrimal glands: Secondary | ICD-10-CM | POA: Insufficient documentation

## 2010-11-08 DIAGNOSIS — R2681 Unsteadiness on feet: Secondary | ICD-10-CM

## 2010-11-08 DIAGNOSIS — R269 Unspecified abnormalities of gait and mobility: Secondary | ICD-10-CM

## 2010-11-08 DIAGNOSIS — N390 Urinary tract infection, site not specified: Secondary | ICD-10-CM

## 2010-11-08 DIAGNOSIS — M129 Arthropathy, unspecified: Secondary | ICD-10-CM

## 2010-11-08 DIAGNOSIS — F068 Other specified mental disorders due to known physiological condition: Secondary | ICD-10-CM

## 2010-11-08 LAB — POCT URINALYSIS DIPSTICK
Glucose, UA: NEGATIVE
Nitrite, UA: NEGATIVE
Protein, UA: NEGATIVE
Urobilinogen, UA: 0.2

## 2010-11-08 NOTE — Progress Notes (Signed)
  Subjective:    Patient ID: [name], female    DOB: [birth year], 77 y.o.   MRN: [id number]  HPI  patient comes in today with daughter for the above concern. She states that since her hospitalization for pneumonia and dehydration gastroenteritis at Christmas time her gait has not been optimal. She has not fallen but is more unsteady seems to be her to the right at times. Her scoliosis and back spondylolisthesis does cause some back pain at times. Otherwise there is no specific weakness. She was told she has some distal peripheral neuropathy that is stable.   She had begun physical therapy in [place] but is now down here. May need another referral.   Review of Systems  no new chest pain shortness of breath urinary tract infection symptoms she has history of UTIs cough unusual rashes. She is on medication for memory loss and mild dementia which may be progressing some.    She is ambulatory unloaded but better with her walker. Dry eyes and to have autologous drops make at [facility] to help. Eating well no weigh loss or fever.     Objective:   Physical Exam  well-developed elderly alert pleasant white female in no acute distress with some obvious kyphosis. She has I discharge and runny nose. She is alert in speech is normal. There is no tremor or cogwheeling. Her gait is a bit shuffling but nonfocal. She is able to get up on the exam table without assistance. Chest:  Clear to A&P without wheezes rales or rhonchi  Kyphosis  CV:  S1-S2 no gallops or murmurs peripheral perfusion is normal Abdomen:  Sof,t normal bowel sounds without hepatosplenomegaly, no guarding rebound or masses no CVA tenderness Ln no cerical adenopathy No clubbing cyanosis or slight ped edema MS  OA changes  Uses all 4 extremities.  Skin no bleeding or unusual rashes.  Reviewed lab data GE record.       Assessment & Plan:  Gait imbalance  prob combination  Look for reversible causes and  Implement PT  Training.  Check  lab and ua today . And do referral.   Dmentia  OA  Kyphosis osteoporosis.

## 2010-11-08 NOTE — Patient Instructions (Signed)
Will notify you  of labs when available. Then will do PT referral.

## 2010-11-09 LAB — CBC WITH DIFFERENTIAL/PLATELET
Basophils Absolute: 0 10*3/uL (ref 0.0–0.1)
Eosinophils Absolute: 0.1 10*3/uL (ref 0.0–0.7)
Lymphocytes Relative: 19.3 % (ref 12.0–46.0)
MCHC: 33.7 g/dL (ref 30.0–36.0)
Neutrophils Relative %: 72.8 % (ref 43.0–77.0)
RBC: 3.72 Mil/uL — ABNORMAL LOW (ref 3.87–5.11)
RDW: 13.8 % (ref 11.5–14.6)

## 2010-11-09 LAB — VITAMIN B12: Vitamin B-12: 691 pg/mL (ref 211–911)

## 2010-11-09 LAB — TSH: TSH: 0.97 u[IU]/mL (ref 0.35–5.50)

## 2010-11-10 NOTE — Progress Notes (Signed)
Pts daughter aware of this

## 2010-11-30 ENCOUNTER — Ambulatory Visit: Payer: Medicare Other | Attending: Internal Medicine | Admitting: Physical Therapy

## 2010-11-30 DIAGNOSIS — IMO0001 Reserved for inherently not codable concepts without codable children: Secondary | ICD-10-CM | POA: Insufficient documentation

## 2010-11-30 DIAGNOSIS — R293 Abnormal posture: Secondary | ICD-10-CM | POA: Insufficient documentation

## 2010-11-30 DIAGNOSIS — R269 Unspecified abnormalities of gait and mobility: Secondary | ICD-10-CM | POA: Insufficient documentation

## 2010-12-06 ENCOUNTER — Ambulatory Visit: Payer: Medicare Other | Attending: Internal Medicine | Admitting: Physical Therapy

## 2010-12-06 DIAGNOSIS — IMO0001 Reserved for inherently not codable concepts without codable children: Secondary | ICD-10-CM | POA: Insufficient documentation

## 2010-12-06 DIAGNOSIS — R293 Abnormal posture: Secondary | ICD-10-CM | POA: Insufficient documentation

## 2010-12-06 DIAGNOSIS — R269 Unspecified abnormalities of gait and mobility: Secondary | ICD-10-CM | POA: Insufficient documentation

## 2010-12-13 ENCOUNTER — Encounter: Payer: Federal, State, Local not specified - PPO | Admitting: Physical Therapy

## 2010-12-20 ENCOUNTER — Ambulatory Visit: Payer: Medicare Other | Admitting: Physical Therapy

## 2010-12-22 ENCOUNTER — Ambulatory Visit: Payer: Medicare Other | Admitting: Physical Therapy

## 2010-12-26 ENCOUNTER — Encounter: Payer: Self-pay | Admitting: Internal Medicine

## 2010-12-26 ENCOUNTER — Ambulatory Visit (INDEPENDENT_AMBULATORY_CARE_PROVIDER_SITE_OTHER): Payer: Medicare Other | Admitting: Internal Medicine

## 2010-12-26 VITALS — BP 120/80 | HR 60 | Temp 98.5°F | Wt 126.0 lb

## 2010-12-26 DIAGNOSIS — L989 Disorder of the skin and subcutaneous tissue, unspecified: Secondary | ICD-10-CM

## 2010-12-26 DIAGNOSIS — L089 Local infection of the skin and subcutaneous tissue, unspecified: Secondary | ICD-10-CM

## 2010-12-26 DIAGNOSIS — H04129 Dry eye syndrome of unspecified lacrimal gland: Secondary | ICD-10-CM

## 2010-12-26 MED ORDER — DOXYCYCLINE HYCLATE 100 MG PO TABS: 100.0000 mg | ORAL_TABLET | Freq: Two times a day (BID) | ORAL | Status: AC

## 2010-12-26 NOTE — Patient Instructions (Signed)
Warm compresses  And polysporin and  Add antibiotic  If not getting better  Recheck or see derm.

## 2010-12-26 NOTE — Progress Notes (Signed)
  Subjective:    Patient ID: [name], female    DOB: [birth year], 77 y.o.   MRN: [id number]  HPI Patient comes in with daughter for an acute visit work in. They noticed a small gauge flaky area on her left lower extremity about 2-3 weeks ago. However over the last number of days it is gotten increased in size and redness but no drainage there is some tenderness and no fever. She called the dermatologist office who recommended she see [place] first and try hydrocortisone. However she had used some Neosporin a couple days. Last antibiotic use was a few months ago when she had pneumonia. No history of MRSA. Past history family history social history reviewed in the electronic medical record.   Review of Systems Negative for chest pain recent fall fever still has eye draining no other skin redness.    Objective:   Physical Exam Well-developed well-nourished elderly alert lady in no acute distress with weeping on a clear. Examination of her left lower extremity reveals no acute joint changes or edema there is a 1.5 cm by 2 cm reddened raised area somewhat indurated not ballotable her fluctuant was 2 patient scales over it. No streaking modestly tender.  No other acute skin lesions.       Assessment & Plan:  Possible skin infection Surface almost looks like an SK or an age scale with symmetrical swelling and redness underneath possible infection.  We'll treat with doxycycline 1 compress as topicals consider term to see if not improving or other alarm features.  GI precautions was using doxycycline.

## 2010-12-27 ENCOUNTER — Ambulatory Visit: Payer: Medicare Other | Admitting: Physical Therapy

## 2010-12-29 ENCOUNTER — Ambulatory Visit: Payer: Medicare Other | Admitting: Physical Therapy

## 2011-01-04 ENCOUNTER — Ambulatory Visit: Payer: Medicare Other | Attending: Internal Medicine | Admitting: Physical Therapy

## 2011-01-04 DIAGNOSIS — R293 Abnormal posture: Secondary | ICD-10-CM | POA: Insufficient documentation

## 2011-01-04 DIAGNOSIS — IMO0001 Reserved for inherently not codable concepts without codable children: Secondary | ICD-10-CM | POA: Insufficient documentation

## 2011-01-04 DIAGNOSIS — R269 Unspecified abnormalities of gait and mobility: Secondary | ICD-10-CM | POA: Insufficient documentation

## 2011-01-10 ENCOUNTER — Ambulatory Visit: Payer: Medicare Other | Admitting: Physical Therapy

## 2011-01-11 ENCOUNTER — Ambulatory Visit: Payer: Medicare Other | Admitting: Physical Therapy

## 2011-01-13 ENCOUNTER — Encounter: Payer: Self-pay | Admitting: Internal Medicine

## 2011-01-13 ENCOUNTER — Telehealth: Payer: Self-pay | Admitting: *Deleted

## 2011-01-13 NOTE — Telephone Encounter (Signed)
May need derm to see.

## 2011-01-13 NOTE — Telephone Encounter (Signed)
Not as bright red around the edges. It is sore. It hasn't gotten any bigger. Nothing coming out of it. Still Crusted over. Daughter to email [place] a picture of it. But we haven't received it yet. I offered daughter an appt today or sat but she can't bring her in. I schedule an appt for Monday afternoon for this pt.

## 2011-01-16 ENCOUNTER — Encounter: Payer: Self-pay | Admitting: Internal Medicine

## 2011-01-16 ENCOUNTER — Ambulatory Visit (INDEPENDENT_AMBULATORY_CARE_PROVIDER_SITE_OTHER): Payer: Medicare Other | Admitting: Internal Medicine

## 2011-01-16 DIAGNOSIS — L989 Disorder of the skin and subcutaneous tissue, unspecified: Secondary | ICD-10-CM

## 2011-01-16 DIAGNOSIS — D485 Neoplasm of uncertain behavior of skin: Secondary | ICD-10-CM | POA: Insufficient documentation

## 2011-01-16 NOTE — Patient Instructions (Signed)
Will contact you about  Dermatology appt   .

## 2011-01-16 NOTE — Progress Notes (Signed)
  Subjective:    Patient ID: [name], female    DOB: 1919/05/30, 77 y.o.   MRN: [id number]  HPI Patient comes in today with attendant for followup of skin lesion on her left lower extremity. Also talked with her daughter [name]. She finished the antibiotic and the redness is down some but it still hurts and she complains of a couple times a day. There is no bleeding or spreading but it is more raised than before. There was a change in medications and no falls. Past history family history social history reviewed in the electronic medical record.   Review of Systems Neg cp sob fever other skin changes     Objective:   Physical Exam Well-developed well-nourished in no acute distress walks independently with walker. Left lower extremity Skin:  There is a line of being size hyperkeratotic heaped up scale he yellowish lesion with some surrounding minimal redness. Induration and redness is down from the last visit. However scaling is more. No satellite lesions. No significant edema.       Assessment & Plan:  Skin lesion persistent although less red concerned about  skin cancer or other  type lesion.  Disc with daughter and patient Will do a referral for eval and app rx .

## 2011-01-17 ENCOUNTER — Ambulatory Visit: Payer: Medicare Other | Admitting: Physical Therapy

## 2011-01-17 NOTE — Consult Note (Signed)
[name], [name]                 ACCOUNT NO.:  [id number]   MEDICAL RECORD NO.:  [id number]          PATIENT TYPE:  INP   LOCATION:  5504                         FACILITY:  [facility]   PHYSICIAN:  Melvyn Novas, M.D.  DATE OF BIRTH:  [birth year]   DATE OF CONSULTATION:  DATE OF DISCHARGE:                                 CONSULTATION   Neurologic consultation for new-onset seizure.   The patient currently is alert and oriented x2.  Her daughter is present  at the bedside.  The patient was brought here to [facility] in  a private car in transfer from a hospital in [facility].  The patient suffered a seizure yesterday on the way to this hospital.    She is an 77 year old Caucasian right-handed female with a history of  mild dementia and diabetes mellitus who was just today discharged from  the [facility] in [place].  She had been admitted in  July 15, 2008, for a day of observation with history of nausea,  vomiting, and diarrhea when she was found to be severely sodium  depleted.  Sodium level was 105 mmol .  The patient was initially placed in  intensive care  and was fluid restricted , her Lasix was helt  in an  attempt to conservatively raise her sodium level.  This went up 122  during  the first day of hospitalisation and 128 at the time of  discharge from the hospital.  The patient was apparently discharged to  home but  her daughter then felt unsure about the quick discharge having  noticed that her mother remained very somnolent and had not returned to  a baseline level of cognitive and physical functioning.  She traveled 4-1/2 hours from [place] to [place] by car, and in the  car noticed the patient developing uncontrollable twitching of the right  hand, apparently not truly a tremor but rather a clonic movement.  She  noticed also that the right leg seemed to have at one time being  involved, and then finally, a secondary  generalization took place and  the patient suffered approximately 1 minute in duration generalized  seizure.    The daughter, Mrs Maple Hudson,  brought her mother here directly to the  emergency department where the patient's son-in-law is working as a  Development worker, community with the [facility] [employer] [facility].  The patient had another seizure in the ER that seemed to be tonic-clonic  and she was loaded with Dilantin.     Dr. Adela Glimpse was asked to admit the patient, so the family will request  [facility] for the followup.  The patient still is treated now for  hyponatremia.  She remains somnolent but without focal neurologic  deficits.  At this point, she has actually slight hyperreflexia on the right and  seemed to have some drift on the left.     The patient's recent hospitalization record shows that she had some  decline in her overall functioning over the last month.  She was  admitted with a pneumonia, she developed bacteremia.  She  was admitted  later again with a pleural effusion.  Her  creatinine was rising to 1.3.  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  So  diuretics medications  were discontinued.   MS: The family still has to fill in some of the information that the  patient cannot answer herself.  Also, she is much more able to carry a  conversation than yesterday her daughter confirmed.  She has recognized  the people around her.  She knows that she has not seen me before.  She  is fairly assured about location, town she is currently, and she is one  day off with the date.  She shows fluent speech, had some word-finding  difficulties.  She could repeat for me fluently but shows mild  dysarthria, probably due to lack of dentures.  CN:She shows peripheral visual field impairment and has a history of  glaucoma. She has no facial droop, no tongue bite , midline uvula and  tongue, can swallow fine. bilateral  symmetric shoulder shrug, can move  all 4 extremeities and denies sensory loss.   PAST MEDICAL HISTORY:  1. Mild dementia.  2. Recurrent UTIs.  3. Chronic anemia.  4. Osteoporosis.  5. Dyslipidemia.  6. Glaucoma.  7. Recent pneumonia with bacteremia.  8. Recent pleural effusion.  9. Recent hyponatremia.   SOCIAL HISTORY:  The patient is a nonsmoker, nondrinker, lives  independently at home but is now moving to reside with her daughter that  is living in [place].   Noncontributory for family history.   ALLERGIES:  CODEINE.   Discharge medications from her previous hospital were:  1. Levaquin 500 mg p.o. daily for UTI.  2. Boniva.  3. Crestor 10 mg daily.  4. Lorazepam 0.5 mg p.r.n.  5. Travatan eye drops.  6. GenTeal eye drops.  7. Aricept 10 mg daily.  8. Aspirin 81 mg daily.  9. Centrum Multivitamin Silver.   Physical examination shows a temperature of 97.2, a blood pressure of  154/60, pulse rate was 99, respirations were 18.  The patient was no  longer on oxygen and seems to have at room air saturation documented at  97%.  She is in no acute distress, pleasant, cooperative.  She opens her  eyes to command.  She is communicative.  Her lungs show no rales, no  rhonchi.  The patient's head is atraumatic.  No recent bruising.  No  clubbing, no cyanosis.  No evidence of decreased skin turgor at this  time.  Tongue is in midline.  No goiter.  No temporal artery induration.  No paraspinal tenderness.  No edema.  Neurologic examination shows the  patient to be oriented x2, alert, pleasant, cooperative in no acute  distress but eagerly drifting off to sleep.  Her baseline is drowsier  than normal,  she is easily arousable and then pays attention for a  minutes before closing her eyes again.  She, however, fell asleep during this  exam 2 or 3 times.  She seems  to like to rest with her eyes closed.  The patient is status  post cataract surgery.  She has a symmetric smile.   Tongue and uvula  deviation was not noticed.  Full extraocular movements with endpoint  nystagmus likely attributed to the Dilantin level.  Full extraocular  movements up and downward.  No ptosis.  No sensory level.  No perioral  dysesthesias.  No sign  of tongue bite or nuchal tissue injury from her  seizure.  No neck rigidity.  In general, normal muscle tone.  Symmetric reflexes  except for the patella, which seems to be more brisk on the right than  left.  The patient was also better able to move her right ankle  dorsiflexion and plantar flexion and provided left strength with the  left, which was surprising given her recent focal onset seizure history.   Finger-nose test was performed without dysmetria, ataxia, very mild  baseline tremor that is not impairing.  Heel-to-shin shows dysmetria.  The patient can extend all 4 extremities antigravity but shows a drift  over the left arm.   Laboratory data showed still white blood cell count elevation yesterday  which could have been demargination after seizure 15.6 thousand.  The  patient also had potassium of 3.6 borderline low, creatinine of 0.6,  sodium of 124.  No evidence of abnormalities in her EKG except for a T-  wave inversion in leads V.  The patient is expected to undergo an MRI  later today plus  EEG later today.   ASSESSMENT:  At this time, new onset seizure attributed  to:  1. Seizure threshold lowered due to hyponatremia. The rapis correction      can in addition cause somnolence and places the patient at risk for      myelinolysis. This is dependent upon the rapidity of the initial      hyponatraemia development as well as the speedy correction.  This will also cause confusion.  She is still somewhat postictal - being  drowsy and having difficulty to remain alert during testing.  This can  partially be attributed to Dilantin dose and partially to this postictum  itself.  1. Question cerebrovascular accident.  I do not see  a lot of right-      sided weakness at this time.  I believe she had todds paralysis and      has now resolved as the partial onset seizure has resolved.  2. Hyponatremia is likely multifactorial.  Again the SIADH diagnosis      was maintained.  We also need to look at all her medications to see      if there is a hyponatremic side effect to be considered from those.      The patient is at baseline, mildly demented according to the      family.  I think she needs to fully recover from her seizure and      from the possible Dilantin side effects before we can perform a      valid mini-mental test to score her impairment.  I would recommend very conservative managment of electrolytes, no  further push towards normonatraemia.  I agree with Dr. Adela Glimpse ordering Rocephin as a blood brain barrier  penetrant antibiotic until we have ruled out any kind of continued  bacteremia. The patient appeared not septic and will be able to get off  antibiotics if her WBC and VS remain normal.   1. I would like to switch the patient from Dilantin to Keppra IV      finding this less likely to cause ataxia and mental status changes.      I would for this patient urge caution  with Ativan and other      antianxiety drugs.   I explained to Ms. Young, the patient's daughter that I will review the  MRI and bring the results later back to her.  I would like to add that  her sodium at 6:45 this morning was called back to me at 130 mEq/L , a  bit closer to the normal levels- and that she was hyperglycemic at 111 .  Her calcium was still somewhat low at 8.1 mg/dL.  We will maintain the patient at normal saline with 20 mEq of K and allow  oral intake of food once alert enough to swallow safely.      Melvyn Novas, M.D.  Electronically Signed     CD/MEDQ  D:  07/17/2008  T:  07/18/2008  Job:  [id number]   cc:   Vikki Ports A. Felicity Coyer, MD

## 2011-01-17 NOTE — H&P (Signed)
NAME:  [name], [name] NO.:  [id number]   MEDICAL RECORD NO.:  [id number]          PATIENT TYPE:  INP   LOCATION:  1823                         FACILITY:  [facility]   PHYSICIAN:  Michiel Cowboy, MDDATE OF BIRTH:  1918/11/25   DATE OF ADMISSION:  07/16/2008  DATE OF DISCHARGE:                              HISTORY & PHYSICAL   PRIMARY CARE PHYSICIAN:  The patient currently does not have a primary  care [name], but per her family would prefer to have [facility].   Reportedly has a scheduled follow-up appointment with an internal  medicine physician at [facility].   CHIEF COMPLAINT:  Seizure.   HISTORY OF PRESENT ILLNESS:  The patient is an 77 year old female with  history of dementia and diabetes who was just today discharged from the  [facility] in [place], where she was admitted July 15, 2008, with history of nausea, vomiting and diarrhea with a sodium of  105.  She was initially put in ICU and apparently given hot salts.  Per  reading the records, sodium went up from 105 to 118 in 9 hours, and then  up to 122 the next 12 hours, and then up to 128 by this a.m. at 5:00  a.m. this morning with the patient overall correction in the first 24  hours of 17.   The patient was shortly discharged from ICU then to step-down and then  discharged to home next day.  Her daughter who reportedly felt somewhat  unsure about such a quick discharge, wanted to have transferred the  patient to [place] where she felt she would get some better care.  So they traveled for 4 hours from [place] to [place].  In the middle  of her drive, the patient started to complain of inability to move her  right hand, and reportedly this happened a couple of times, and then she  had what seemed like a seizure that started from her right hand and leg  jerking in her hand, jerking to the right, and then spread to  generalized seizure.  Daughter brought  her into the emergency department  where the patient had another seizure that seemed more generalized tonic-  clonic seizure at which point she was loaded with Dilantin.  [facility]  hospitalist was called to admit for [place].   Of note, the cause for her hyponatremia per the [place] physicians were  thought to be multifactorial.  Prior to this, patient was started on  diuretics and salt restriction as well as fluid restriction secondary to  bilateral pleural effusions which were thought to be secondary to fluid  overload, and they were also concerned whether her hypernatremia was  contributed by over diuresis, and also she might have underlying SIADH.  Of note, her baseline sodium per labs that were sent with her, were 119  on November 5 when she was apparently dehydrated with creatinine 1.3 as  well.   REVIEW OF SYSTEMS:  Family has to provide review of systems as the  patient is unable to.  Per them, she walks with  a walker, has  significant dementia but is able to carry on a conversation.  Knows  everybody, but does have some memory problems.  Has not had any recent  chest pain, shortness of breath, but had a low grade fever up to 100  with nausea and vomiting.  Otherwise, unremarkable.   PAST MEDICAL HISTORY:  1. Dementia.  2. Recent urinary tract infection.  3. Chronic anemia.  4. Osteoporosis.  5. Dyslipidemia.  6. Glaucoma.   SOCIAL HISTORY:  The patient does not smoke or drink.  Lives at home,  but currently is going to be residing with her daughter.  Her son-in-law  works at [employer] and is a Wellsite geologist.   FAMILY HISTORY:  Noncontributory.   ALLERGIES:  CODEINE.   DISCHARGE MEDICATIONS:  1. Levaquin 500 mg p.o. daily for a UTI.  2. Boniva 150 months.  3. Crestor 10 mg daily.  4. Lorazepam 0.5 q.p.m. as needed.  5. Travatan eye drops 0.004, one drop in the left eye q.p.m.  6. Centrum Silver.  7. GenTeal  hypromellose eye drops to both eyes at  bedtime.  8. Aricept 10.  9. Aspirin 81.   PHYSICAL EXAMINATION:  VITAL SIGNS:  Temperature 97.2, blood pressure  154/60, pulse 99, respirations 18, sating 100% on 2 liters.  GENERAL:  The patient currently appears to be in no acute distress but  somewhat sleepy, postictal.  The patient receiving IV Dilantin.  Still  complaining of right hand not being able to function.  HEENT:  Appears to be nontraumatic.  Somewhat dry mucous membranes and  decreased skin turgor.  LUNGS:  Clear to auscultation anteriorly.  The patient not cooperate  sitting up.  ABDOMEN:  Soft, nontender, nondistended.  LOWER EXTREMITIES: Without clubbing, cyanosis or edema.  NEUROLOGICAL:  Strength 4/5 on the right upper and lower extremity and  5/5 on the left.  Cranial nerves II-XII.  Smile appears to be symmetric,  but the patient is edentulous, this makes exam somewhat difficult.   LABORATORY DATA:  White blood cell count 15.6, hemoglobin 9.9, sodium  124, creatinine 0.6, potassium 3.6.  CT scan of the head showed atrophy,  right cerebellar old stroke, nothing new.  Chest x-ray shows poor  inspiration.  EKG showing normal sinus rhythm.  No evidence of ST  elevation or depression with some criteria for LVH.  There is a T-wave  inversion in lead III.   ASSESSMENT AND PLAN:  This is an 77 year old female with new onset  seizure and now right sided weakness with recent hyponatremia which was  corrected by hot salts.   ASSESSMENT AND PLAN:  1. New onset seizure.  Discussed with Dr. Vickey Huger who agreed to see      her in a.m.  Recommended instead of dilantin use Keppra as Dilaudid      can lower sodium level.  Will order MRI of the brain given right-      sided weakness.  Order EEG.  Seizure precautions.  2. Questioned CVA, given and right-sided weakness and partial      seizures.  Will do MRI, MRA, carotid Dopplers, 2-D echo.  Further      risk stratify with fasting lipid panels, hemoglobin A1C, TSH and       homocystine level.  Admit to tele.  3. Hyponatremia.  Improved from the one a couple of days ago, but      still hyponatremic.  It is hard to determine if new seizure  is      related to her current hypernatremia versus secondary to some      possible CVA versus secondary to rapid correction of her      hyponatremia in the past.  Would be careful with attempting to      correct this.  I am not sure what the patient's baseline is at this      point.  I am not sure if she actually has a true SIADH diagnosis or      not versus a reset of Nystat.  Currently, she actually appears to      be somewhat dry clinically with decreased skin turgor and dry      mucous membranes.  Given that she is dry at this point, clinically      will give her very gentle normal saline at 50 ml an hour and follow      BMP.  If hyponatremia worsens with IV fluids, then it is more      likely that there is a SIADH process underlying this particular      hyponatremic episode, in which case, however, her IV fluid should      be stopped and she should be placed on fluid restriction.  Will get      urine sodium, urine creatinine.  We will get a.m. cortisol and TSH      level and may need renal consult in order to see how to address her      hyponatremia more appropriately in this situation.  4. History of dementia.  Continue Aricept.  5. History UTI, currently UA appears to be within normal limits, but      the patient probably needs to continue her course of antibiotics.      Will avoid Levaquin for seizure threshold.  Will instead do      Rocephin.  6. Elevated white blood cell count.  Wonder if this is secondary to      recent seizure stress, but chest x-ray appeared to be unremarkable,      but will repeat in a.m.  Encephalitis or meningitis likely given      that the patient is not endorsing headaches or stiff neck.      Currently afebrile, nontoxic if MRI is worrisome for her herpes      related encephalitis, then  treatment will be indicated.  7. History of anxiety/insomnia.  Continue Ativan p.r.n.  The family is      unsure if the patient has not been taking this is appropriately.      This could cause potential withdrawal, but the dose of Ativan was      fairly low at 0.5 q.p.m. as needed.  Doubt that this would cause      enough withdrawal to cause seizures.  8. Prophylaxis.  Protonix plus SCDs.   CODE STATUS:  The patient is DNR/DNI.   Dr. Felicity Coyer will assume care in the morning.      Michiel Cowboy, MD  Electronically Signed     AVD/MEDQ  D:  07/16/2008  T:  07/17/2008  Job:  [id number]

## 2011-01-17 NOTE — Discharge Summary (Signed)
NAMEKEYERA, HATTABAUGH                 ACCOUNT NO.:  [id number]   MEDICAL RECORD NO.:  [id number]          PATIENT TYPE:  INP   LOCATION:  5504                         FACILITY:  [facility]   PHYSICIAN:  Raenette Rover. Felicity Coyer, MDDATE OF BIRTH:  [birth year]   DATE OF ADMISSION:  07/16/2008  DATE OF DISCHARGE:  07/21/2008                               DISCHARGE SUMMARY   PRIMARY CARE PHYSICIAN:  Neta Mends. Panosh, MD, newly established.   DISCHARGE DIAGNOSES:  1. New-onset seizure activity in the setting of marked hyponatremia.  2. Hyponatremia.  3. Left lower lobe pneumonia.  4. Right hand cramping.  5. Anemia.  6. Primary esophageal dysphagia.   HISTORY OF PRESENT ILLNESS:  Ms. Milo is a very pleasant 77 year old  white female with past medical history of chronic anemia, mild dementia,  dyslipidemia, and glaucoma who presented to [facility] Emergency  Department on day of admission with seizure-like activity.  Of note, the  patient was living in [place] prior to this admission and was recently  discharged from [facility] on July 15, 2008, with  severe hyponatremia.  Daughter reports patient with sodium of 105, at  which time the patient was placed in intensive care unit and treated and  released in approximately  2 days.  Daughter was driving the patient to  [place] to stay with her after discharge from the hospital.  Upon  road trip to [place], the patient began complaining of inability to  move her right hand and daughter noticed right hand and leg jerking  movements.  Upon arrival to [facility], daughter brought the patient  into [facility] Room, at which time the patient did suffer a  witnessed tonic-clonic seizure. Also of note prior to this admission,  the patient was started on sodium restriction and diuretic therapy upon  discharge from previous hospitalization, as she was thought to be  experiencing some volume overload.  Again upon  evaluation in the ER, the  patient noted to have generalized tonic-clonic seizure.  Lab data  revealed white cell count of 15.6, hemoglobin of 9.9, sodium of 124.  The patient was admitted at that time for further evaluation and  treatment.  Neurology was asked to see the patient in consultation.   PAST MEDICAL HISTORY:  1. Dementia.  2. Recurrent urinary tract infections.  3. Chronic anemia.  4. Osteoporosis.  5. Dyslipidemia.  6. Glaucoma.   CONSULTATIONS DURING THIS ADMISSION:  [facility].   COURSE OF HOSPITALIZATION:  1. New-onset seizure activity in setting of marked hyponatremia.      Again, the patient is seen in consultation by Neurology during this      hospitalization.  Please see complete consultation dictation for      details.  Certainly felt the patient's seizure threshold lowered by      hyponatremia, which was also likely leading to the patient's      confusion.  The patient was loaded with Dilantin in the emergency      room and has since been placed on Keppra per Neurology's  recommendations.  An MRI/MRA of the brain was obtained to rule out      acute CVA.  MRI findings revealed no acute abnormality, however,      did reveal remote lacunar infarcts in the right cerebellum as well      as atherosclerotic changes.  The patient has not experienced any      recurrent seizure activity since time of admission in correction of      hyponatremia.  2. Hyponatremia.  Again, the patient with recent admission to [facility] in [place] for same and discharged just prior to      this admission.  At time of admission to [facility], the patient's      sodium level 124.  The patient was hydrated gently with IV fluids.      Sodium has corrected nicely with a discharge sodium of 134.  The      patient not to continue any diuretic therapy at time of discharge      with no sodium restrictions on her diet.  Also of note, there is       question of whether or not the patient may have underlying SIADH.      She will need to be further assessed by primary care physician when      the patient recovered from this hospitalization.  3. Anemia.  The patient did require transfusion of 2 units of packed      red blood cells during this admission for hemoglobin dropping to      7.9, likely element of hemodilution with IV fluids were correcting.      The patient without any clear signs of acute blood loss on exam.      Anemia panel obtainable was within normal limits.  Hemoccult stools      were negative.  I recommend close outpatient followup per primary      care physician.  4. Left lower lobe pneumonia.  A chest x-ray obtained on July 17, 2008, revealed left lower lobe airspace disease with a left-sided      pleural effusion.  The patient was treated with azithromycin and      Rocephin.  At this time, she has been transitioned to oral      medications to complete 10 days total.  There was a question of      whether or not the patient experiencing aspiration leading to      pneumonia on chest x-ray.  The patient did undergo evaluation by      speech therapy during this evaluation and underwent modified barium      swallow revealing primary esophageal dysphagia.  At time of      discharge, the recommendation is for 5-6 small meals per day for      better esophageal clearance.  In addition, the patient is to      continue regular thin liquid diet.  5. Right hand cramping.  Daughter reports of cramping to the patient's      right hand and has become significantly worse since episode en      route to [place].  The patient with difficulty using right hand      due to cramping and contracting, question of whether or not the      patient's symptom is due to severe osteoarthritis versus      neurological etiology.  At the time of discharge, we will start  the      patient on low-dose Neurontin p.o. nightly per Neurology's       recommendations.   MEDICATIONS AT TIME OF DISCHARGE:  1. Crestor 10 mg p.o. daily.  2. Aspirin 81 mg p.o. every other day.  3. Aricept 10 mg p.o. daily.  4. Travatan 0.004% eye drops daily.  5. Keppra 250 mg p.o. b.i.d.  Note, this is a new medication.  6. Ceftin 250 mg p.o. b.i.d. until gone.  7. Lorazepam 0.5 mg p.o. daily p.r.n. anxiety.  8. Neurontin 100 mg p.o. nightly for hand cramping.  Note, this is a      new medication.   PERTINENT LABORATORY WORK AT TIME OF DISCHARGE:  White cell count 8.0,  platelet count 272, hemoglobin 12.0, hematocrit 35.2.  Sodium 134,  potassium 3.9, BUN 10, creatinine 0.58.  Anemia panel within normal  limits.  TSH within normal limits.  Again, fecal occult blood negative  x1.  Magnesium and phosphorus within normal limits.  Total cholesterol  101, HDL 47, LDL 44.   DISPOSITION:  The patient felt medically stable for discharge home at  this time.  The patient will be discharged with her daughter where she  will be living after this hospitalization.  The patient will be  discharged home with home  health, physical and occupational therapy, 3-in-1 rolling walker as well  as hospital bed and home health aide.  The patient and daughter  instructed to follow up with their primary care physician, Dr. Berniece Andreas on Thursday, July 23, 2008, at 8:15 a.m. to establish primary  care.      Cordelia Pen, NP      Raenette Rover. Felicity Coyer, MD  Electronically Signed    LE/MEDQ  D:  07/21/2008  T:  07/21/2008  Job:  [id number]   cc:   Neta Mends. Fabian Sharp, MD  Melvyn Novas, M.D.

## 2011-01-17 NOTE — Procedures (Signed)
EEG NUMBER:  [id number].   CLINICAL HISTORY:  The patient is an 77 year old who had onset of  seizure activity and complained of right hand drawing.  The patient has  a history of dementia.  Study is being done to look for the presence of  seizures. (780.39)   PROCEDURE:  The tracing is carried out on a 32-channel, digital Cadwell  recorder reformatted into 16-channel montages with one devoted to EKG.  The patient was awake, drowsy, and asleep during the recording.  The  International 10/20 System lead placement was used.  Medications include  Keppra, Dilantin, Crestor, Protonix, Aricept, Travatan, aspirin,  Rocephin, and Ativan.   DESCRIPTION OF FINDINGS:  Dominant frequency is an 8-9 Hz, 15- to 20-  microvolt activity that was prominent in the posterior regions and  centrally.  Superimposed upon this is mixed frequency of theta and delta  range activity and frontally predominant beta range components.  The  patient  begins the record in natural sleep with pervasive delta range  activity with vertex sharp waves and symmetric sleep spindles.   There was no focal slowing.  There was no interictal epileptiform  activity in the form of spikes or sharp waves.   EKG showed regular sinus rhythm with ventricular response of 78-84 beats  per minute.   IMPRESSION:  Abnormal EEG on the basis of diffuse background slowing.  Nonspecific indicator of neuronal dysfunction maybe on a primary  degenerative basis in this case related to dementia or related to  variety of toxic or metabolic etiologies.      Deanna Artis. Sharene Skeans, M.D.  Electronically Signed     WVP:XTGG  D:  07/18/2008 15:54:36  T:  07/18/2008 23:34:44  Job #:  [id number]   cc:   Melvyn Novas, M.D.  Fax: [phone]

## 2011-01-18 ENCOUNTER — Other Ambulatory Visit: Payer: Self-pay | Admitting: Dermatology

## 2011-01-19 ENCOUNTER — Ambulatory Visit: Payer: Medicare Other | Admitting: Physical Therapy

## 2011-01-20 NOTE — Op Note (Signed)
NAMEEDWARDINE, DESCHEPPER                 ACCOUNT NO.:  [id number]   MEDICAL RECORD NO.:  [id number]          PATIENT TYPE:  AMB   LOCATION:  [facility]                         FACILITY:  [facility]   PHYSICIAN:  Ronald L. Earlene Plater, M.D.  DATE OF BIRTH:  1919/05/24   DATE OF PROCEDURE:  12/30/2004  DATE OF DISCHARGE:                                 OPERATIVE REPORT   DIAGNOSIS:  Possible interstitial cystitis.   OPERATION:  Cystourethroscopy, hydraulic bladder distention, bladder biopsy.   SURGEON:  Lucrezia Starch. Earlene Plater, M.D.   ANESTHESIA:  LMA.   ESTIMATED BLOOD LOSS:  Negligible.   TUBES:  None.   COMPLICATIONS:  None.   INDICATIONS FOR PROCEDURE:  Ms. Barman is a lovely 77 year old white female  who presents with a many-year history of frequency, burning, suprapubic pain  relieved by voiding, urgency and occasional leakage, mainly at night.  She  has been worked up thoroughly by Dr. Noreene Filbert in [place], [place]  and has tried multiple therapies, including Flomax, Enablex, esterase cream,  with limited results.  She has had an office cystoscopy.  She tried Urocit,  and she has had an MMP22 that was negative, and urine cultures have been  negative.  She has also had an IVP in the past, which was negative, and  absolutely nothing seems to help.  After understanding the risks, benefits  and alternatives, she has elected to proceed with the above workup.   PROCEDURE IN DETAIL:  The patient was placed in the supine position.  After  the proper LMA anesthesia, she was placed in the dorsal lithotomy position  and prepped and draped with Betadine in a sterile fashion.  Cystourethroscopy was performed with a 22.5 French Olympus pan endoscope,  utilizing the 12 and 70 degree lenses.  The bladder was carefully inspected.  It was smooth-walled.  Efflux with clear urine was noted from the normally  placed ureteral orifices bilaterally.  The urethra appeared to be normal.  Hydraulic bladder distention was  then performed to 80 cm of water pressure,  and she was noted to have a capacity of 650 cc.  The bladder was drained,  and really, no submucosal glomerulations or cracks were noted.  No Hunner's  ulcer were noted.  Biopsies were then obtained of the posterior midline and  submitted to pathology.  The base was cauterized with Bugbee coagulation  cautery.  The bladder was drained.  The pan endoscope was removed.  The  patient was taken to the recovery room stable.    RLD/MEDQ  D:  12/30/2004  T:  12/30/2004  Job:  [id number]

## 2011-01-23 ENCOUNTER — Ambulatory Visit: Payer: Medicare Other | Admitting: Physical Therapy

## 2011-02-06 ENCOUNTER — Other Ambulatory Visit: Payer: Self-pay | Admitting: Internal Medicine

## 2011-02-06 MED ORDER — DONEPEZIL HCL 10 MG PO TABS: 10.0000 mg | ORAL_TABLET | Freq: Every day | ORAL | Status: DC

## 2011-02-06 NOTE — Telephone Encounter (Signed)
Pt needs new rx 10 day supply of generic aricept 10mg  call into [facility] [phone].

## 2011-02-06 NOTE — Telephone Encounter (Signed)
Rx sent to pharmacy   

## 2011-02-14 ENCOUNTER — Other Ambulatory Visit: Payer: Self-pay | Admitting: Internal Medicine

## 2011-02-15 ENCOUNTER — Other Ambulatory Visit (INDEPENDENT_AMBULATORY_CARE_PROVIDER_SITE_OTHER): Payer: Medicare Other

## 2011-02-15 ENCOUNTER — Telehealth: Payer: Self-pay | Admitting: *Deleted

## 2011-02-15 DIAGNOSIS — R300 Dysuria: Secondary | ICD-10-CM

## 2011-02-15 LAB — POCT URINALYSIS DIPSTICK
Glucose, UA: NEGATIVE
Spec Grav, UA: 1.015
Urobilinogen, UA: 0.2

## 2011-02-15 NOTE — Telephone Encounter (Signed)
Pt is having burning upon urination and urinary frequency. Per Dr. Scotty Court this would be fine to bring in a ua to see what is going on.

## 2011-02-16 ENCOUNTER — Telehealth: Payer: Self-pay | Admitting: *Deleted

## 2011-02-16 MED ORDER — CIPROFLOXACIN HCL 500 MG PO TABS: 500.0000 mg | ORAL_TABLET | Freq: Two times a day (BID) | ORAL | Status: AC

## 2011-02-16 NOTE — Telephone Encounter (Signed)
Pt's daughter is wanting her urine results.

## 2011-02-16 NOTE — Telephone Encounter (Signed)
Per Dr. Scotty Court- not much going on in urine. But that we are culture. Can call in Cipro 500mg  1 bid x 10 days  Pt's daughter aware.

## 2011-02-17 NOTE — Telephone Encounter (Signed)
To tx cipro 500 mg bid x 10 days

## 2011-02-18 LAB — URINE CULTURE: Colony Count: >=100000

## 2011-03-06 ENCOUNTER — Other Ambulatory Visit: Payer: Self-pay | Admitting: Internal Medicine

## 2011-03-13 ENCOUNTER — Telehealth: Payer: Self-pay | Admitting: *Deleted

## 2011-03-13 ENCOUNTER — Other Ambulatory Visit: Payer: Federal, State, Local not specified - PPO

## 2011-03-13 NOTE — Telephone Encounter (Signed)
Spoke with daughter- frequent urination, no burning upon urination. No fever. Pt has went 4 times in 2 hours. This has been going on for days. I told her that it was okay with Dr. Fabian Sharp to drop off a urine specimen.

## 2011-03-13 NOTE — Telephone Encounter (Signed)
Pt's daughter is calling and stating her Mom is having frequent urination, and they would like to bring a urine specimen to the office to be checked.

## 2011-03-14 ENCOUNTER — Other Ambulatory Visit: Payer: Medicare Other

## 2011-03-14 DIAGNOSIS — R350 Frequency of micturition: Secondary | ICD-10-CM

## 2011-03-14 DIAGNOSIS — N390 Urinary tract infection, site not specified: Secondary | ICD-10-CM

## 2011-03-14 LAB — POCT URINALYSIS DIPSTICK
Ketones, UA: NEGATIVE
Protein, UA: NEGATIVE
Spec Grav, UA: 1.015
pH, UA: 7

## 2011-03-16 ENCOUNTER — Telehealth: Payer: Self-pay | Admitting: Internal Medicine

## 2011-03-16 NOTE — Telephone Encounter (Signed)
Pt's daughter called 7/12. Thought she was to receive a call about her mother's urine test results on 7/10. Has not heard anything. Please call her.

## 2011-03-17 LAB — URINE CULTURE: Colony Count: 55000

## 2011-03-17 NOTE — Progress Notes (Signed)
Pt's sister aware of this. 

## 2011-03-17 NOTE — Telephone Encounter (Signed)
Pt's daughter aware of ua dip and that we are waiting on the culture results.

## 2011-04-05 ENCOUNTER — Telehealth: Payer: Self-pay | Admitting: Internal Medicine

## 2011-04-05 NOTE — Telephone Encounter (Signed)
Message copied by [name] on Wed Apr 05, 2011  1:41 PM ------      Message from: [place], Altura D      Created: Mon Feb 06, 2011  1:40 PM       [facility] about 2 weeks out from excision of squamous cell skin cancer left lower leg by Yvone Neu. She has a surgical defect size of a nickle. About 5-6 days ago she developed a cellulitis there. I spoke with Amy [place] on call for Derm and she called in cipro 500 mg bid. Yesterday 6/3 it was looking worse, extending erythema and temp 100 oral. [name] had mentioned to [employer], so I called him last night and he recommended change to Zyvox, 600 mg bid. We were to check with him this AM about possible admission. Today she looked considerably better.- afebrile, less pain and less intense red after 3 doses of Zyvox. Plan to continue present Rx. I wanted to bring you up to date FYI. Thanks- Clint Young

## 2011-04-11 ENCOUNTER — Telehealth: Payer: Self-pay

## 2011-04-11 NOTE — Telephone Encounter (Signed)
Pt's daughter called. Pt has urinary frequency but c/o no other sxs. Pt also has a rash around her navel.

## 2011-04-11 NOTE — Telephone Encounter (Signed)
Pt's daughter states that she doesn't feel like it's urgent and will wait until Dr. Fabian Sharp returns to the office to schedule an OV

## 2011-04-20 ENCOUNTER — Ambulatory Visit (INDEPENDENT_AMBULATORY_CARE_PROVIDER_SITE_OTHER): Payer: Medicare Other | Admitting: Internal Medicine

## 2011-04-20 ENCOUNTER — Encounter: Payer: Self-pay | Admitting: Internal Medicine

## 2011-04-20 VITALS — BP 130/80 | HR 78 | Temp 98.8°F

## 2011-04-20 DIAGNOSIS — H04129 Dry eye syndrome of unspecified lacrimal gland: Secondary | ICD-10-CM

## 2011-04-20 DIAGNOSIS — R059 Cough, unspecified: Secondary | ICD-10-CM

## 2011-04-20 DIAGNOSIS — F068 Other specified mental disorders due to known physiological condition: Secondary | ICD-10-CM

## 2011-04-20 DIAGNOSIS — N390 Urinary tract infection, site not specified: Secondary | ICD-10-CM

## 2011-04-20 DIAGNOSIS — R32 Unspecified urinary incontinence: Secondary | ICD-10-CM

## 2011-04-20 DIAGNOSIS — R05 Cough: Secondary | ICD-10-CM

## 2011-04-20 DIAGNOSIS — R300 Dysuria: Secondary | ICD-10-CM

## 2011-04-20 DIAGNOSIS — Z87448 Personal history of other diseases of urinary system: Secondary | ICD-10-CM

## 2011-04-20 LAB — POCT URINALYSIS DIPSTICK
Bilirubin, UA: NEGATIVE
Ketones, UA: NEGATIVE
Spec Grav, UA: 1.005
pH, UA: 6.5

## 2011-04-20 MED ORDER — CIPROFLOXACIN HCL 500 MG PO TABS: 500.0000 mg | ORAL_TABLET | Freq: Two times a day (BID) | ORAL | Status: AC

## 2011-04-20 NOTE — Patient Instructions (Signed)
Antibiotic   For probably UTI  Will notify  of culture results when available

## 2011-04-20 NOTE — Progress Notes (Signed)
  Subjective:    Patient ID: [name], female    DOB: 07/20/1919, 77 y.o.   MRN: [id number]  HPI Comes  If for a couple of issues.Here with daughter .   Eyes: some better with  Autologous drops UTI sx increasing in frequency and some urgency and burning this am.  No fever  CVA pain.Some malaise and fatigue recently . Cough : for about 3 weeks without sob dyspnea? Reflux. Dry usually am.   For weeks.  No dysphagia with this.  Cough more in am and better as day goes on. Dementia about the same  [facility] with walker  . Review of Systems Neg cp sob fever  Has dry eyes . No bleeding  Falling recently injury. appetite is very good and not altered by above.  Area on leg was skin cancer and removed and had secondary infection now resolved  Past history family history social history reviewed in the electronic medical record. Day program adult day care     Objective:   Physical Exam WD WN kyphosis alert  In nad  Drippy nose   Minor cough  Dry  Alert non toxic appearing   Uses walker well.  HEENT:  eac clear nares slight runny op clear upper dentrures loose Neck no masses or bruits. Chest:  Clear to A&P without wheezes rales or rhonchi  kyphosid CV:  S1-S2 no gallops or murmurs peripheral perfusion is normal Abdomen:  Sof,t normal bowel sounds without hepatosplenomegaly, no guarding rebound or masses no CVA tenderness well healed lower abd scar Extr no atrophy local  Pulse intact  [place] 3+ blood 1+ leukocytes    Assessment & Plan:  Dysuria and inc frequency in setting of IU anyway  And hx of recurrent uti Cough  Nl lung exam  ? Viral  ? gerd  o other alarm features   Will follow and reeval if  persistent or progressive  Dementia   HT stable Hx of UI

## 2011-04-23 LAB — URINE CULTURE

## 2011-04-24 ENCOUNTER — Other Ambulatory Visit (HOSPITAL_COMMUNITY): Payer: Self-pay | Admitting: Orthopedic Surgery

## 2011-04-24 DIAGNOSIS — M75101 Unspecified rotator cuff tear or rupture of right shoulder, not specified as traumatic: Secondary | ICD-10-CM

## 2011-04-25 NOTE — Progress Notes (Signed)
Left message on machine to call back  

## 2011-05-02 ENCOUNTER — Ambulatory Visit (HOSPITAL_COMMUNITY)
Admission: RE | Admit: 2011-05-02 | Discharge: 2011-05-02 | Disposition: A | Discharge status: Home / Self Care | Payer: Medicare Other | Source: Ambulatory Visit | Attending: Orthopedic Surgery | Admitting: Orthopedic Surgery

## 2011-05-02 DIAGNOSIS — S46819A Strain of other muscles, fascia and tendons at shoulder and upper arm level, unspecified arm, initial encounter: Secondary | ICD-10-CM | POA: Insufficient documentation

## 2011-05-02 DIAGNOSIS — X58XXXA Exposure to other specified factors, initial encounter: Secondary | ICD-10-CM | POA: Insufficient documentation

## 2011-05-02 DIAGNOSIS — M75101 Unspecified rotator cuff tear or rupture of right shoulder, not specified as traumatic: Secondary | ICD-10-CM

## 2011-05-02 DIAGNOSIS — M25519 Pain in unspecified shoulder: Secondary | ICD-10-CM | POA: Insufficient documentation

## 2011-05-09 ENCOUNTER — Telehealth: Payer: Self-pay | Admitting: *Deleted

## 2011-05-09 NOTE — Telephone Encounter (Signed)
Per Dr. Fabian Sharp- okay to send rx to the pharmacy.

## 2011-05-09 NOTE — Telephone Encounter (Signed)
Pt is having the same cough as daughter. No fever. Daughter has been giving her, her hydrocodone syrup. They are out and need rx for this.

## 2011-05-10 MED ORDER — HYDROCODONE-HOMATROPINE 5-1.5 MG/5ML PO SYRP
ORAL_SOLUTION | ORAL | Status: DC
Start: 2011-05-10 — End: 2011-08-01

## 2011-05-10 NOTE — Telephone Encounter (Signed)
Rx called in and daughter aware.

## 2011-05-15 ENCOUNTER — Telehealth: Payer: Self-pay | Admitting: *Deleted

## 2011-05-15 MED ORDER — ROSUVASTATIN CALCIUM 10 MG PO TABS: 10.0000 mg | ORAL_TABLET | Freq: Every day | ORAL | Status: DC

## 2011-05-15 NOTE — Telephone Encounter (Signed)
rx sent to pharmac

## 2011-06-06 LAB — PROTIME-INR
INR: 1.2
Prothrombin Time: 15.4 — ABNORMAL HIGH

## 2011-06-06 LAB — CARDIAC PANEL(CRET KIN+CKTOT+MB+TROPI)
CK, MB: 1.1
Relative Index: INVALID
Relative Index: INVALID
Relative Index: INVALID
Total CK: 34
Total CK: 35
Total CK: 41
Total CK: 62

## 2011-06-06 LAB — COMPREHENSIVE METABOLIC PANEL
ALT: 14
AST: 16
AST: 17
Albumin: 2.5 — ABNORMAL LOW
CO2: 22
Calcium: 8.2 — ABNORMAL LOW
Chloride: 97
Creatinine, Ser: 0.55
Creatinine, Ser: 0.58
GFR calc Af Amer: >60
GFR calc Af Amer: >60
GFR calc non Af Amer: >60
GFR calc non Af Amer: >60
Sodium: 125 — ABNORMAL LOW
Total Bilirubin: 0.2 — ABNORMAL LOW

## 2011-06-06 LAB — CULTURE, BLOOD (ROUTINE X 2)

## 2011-06-06 LAB — CBC
HCT: 23.1 — ABNORMAL LOW
HCT: 28 — ABNORMAL LOW
HCT: 33.3 — ABNORMAL LOW
HCT: 34.3 — ABNORMAL LOW
HCT: 35.2 — ABNORMAL LOW
Hemoglobin: 11 — ABNORMAL LOW
Hemoglobin: 7.9 — CL
Hemoglobin: 9.4 — ABNORMAL LOW
MCHC: 32.9
MCHC: 33.5
MCHC: 34.1
MCV: 88.1
MCV: 89.3
MCV: 90.1
Platelets: 269
Platelets: 272
Platelets: 275
Platelets: 346
RBC: 3.1 — ABNORMAL LOW
RDW: 13.2
RDW: 14.5
RDW: 14.7
WBC: 15.6 — ABNORMAL HIGH
WBC: 9.6
WBC: 9.6

## 2011-06-06 LAB — URINE CULTURE: Culture: NO GROWTH

## 2011-06-06 LAB — TYPE AND SCREEN

## 2011-06-06 LAB — BASIC METABOLIC PANEL
BUN: 10
BUN: 6
BUN: 8
CO2: 27
CO2: 28
CO2: 30
Chloride: 95 — ABNORMAL LOW
Creatinine, Ser: 0.58
GFR calc Af Amer: >60
GFR calc non Af Amer: >60
GFR calc non Af Amer: >60
GFR calc non Af Amer: >60
Glucose, Bld: 111 — ABNORMAL HIGH
Glucose, Bld: 83
Glucose, Bld: 86
Potassium: 3.7
Potassium: 4.1
Potassium: 4.5
Sodium: 131 — ABNORMAL LOW

## 2011-06-06 LAB — URINALYSIS, ROUTINE W REFLEX MICROSCOPIC
Glucose, UA: 100 — AB
Leukocytes, UA: NEGATIVE
Protein, ur: NEGATIVE
Specific Gravity, Urine: 1.017

## 2011-06-06 LAB — APTT: aPTT: 29

## 2011-06-06 LAB — LIPID PANEL
Cholesterol: 101
LDL Cholesterol: 44
Total CHOL/HDL Ratio: 2.1

## 2011-06-06 LAB — URINE MICROSCOPIC-ADD ON

## 2011-06-06 LAB — POCT I-STAT, CHEM 8
BUN: 10
Chloride: 95 — ABNORMAL LOW
Potassium: 3.6
Sodium: 124 — ABNORMAL LOW
TCO2: 17

## 2011-06-06 LAB — DIFFERENTIAL
Basophils Absolute: 0
Lymphocytes Relative: 5 — ABNORMAL LOW
Lymphs Abs: 0.8
Monocytes Absolute: 0.8
Monocytes Relative: 5
Neutro Abs: 13.9 — ABNORMAL HIGH

## 2011-06-06 LAB — SODIUM, URINE, RANDOM: Sodium, Ur: 67

## 2011-06-06 LAB — FOLATE: Folate: 13.5

## 2011-06-06 LAB — ABO/RH: ABO/RH(D): A POS

## 2011-06-06 LAB — VITAMIN B12: Vitamin B-12: 1065 — ABNORMAL HIGH (ref 211–911)

## 2011-06-06 LAB — TSH: TSH: 0.616

## 2011-06-06 LAB — PHOSPHORUS: Phosphorus: 3.2

## 2011-06-06 LAB — OSMOLALITY, URINE: Osmolality, Ur: 347 — ABNORMAL LOW

## 2011-06-15 LAB — POCT URINALYSIS DIP (DEVICE)
Operator id: 270961
Protein, ur: NEGATIVE
Urobilinogen, UA: 1
pH: 7

## 2011-06-30 ENCOUNTER — Ambulatory Visit (INDEPENDENT_AMBULATORY_CARE_PROVIDER_SITE_OTHER): Payer: Medicare Other

## 2011-06-30 DIAGNOSIS — Z23 Encounter for immunization: Secondary | ICD-10-CM

## 2011-07-02 ENCOUNTER — Other Ambulatory Visit: Payer: Self-pay | Admitting: Internal Medicine

## 2011-08-01 ENCOUNTER — Ambulatory Visit (INDEPENDENT_AMBULATORY_CARE_PROVIDER_SITE_OTHER): Payer: Medicare Other | Admitting: Internal Medicine

## 2011-08-01 ENCOUNTER — Encounter: Payer: Self-pay | Admitting: Internal Medicine

## 2011-08-01 VITALS — BP 120/80 | HR 60 | Wt 135.0 lb

## 2011-08-01 DIAGNOSIS — Z66 Do not resuscitate: Secondary | ICD-10-CM

## 2011-08-01 DIAGNOSIS — T148XXA Other injury of unspecified body region, initial encounter: Secondary | ICD-10-CM

## 2011-08-01 DIAGNOSIS — S8010XA Contusion of unspecified lower leg, initial encounter: Secondary | ICD-10-CM

## 2011-08-06 ENCOUNTER — Encounter: Payer: Self-pay | Admitting: Internal Medicine

## 2011-08-06 DIAGNOSIS — T148XXA Other injury of unspecified body region, initial encounter: Secondary | ICD-10-CM

## 2011-08-06 DIAGNOSIS — S8010XA Contusion of unspecified lower leg, initial encounter: Secondary | ICD-10-CM | POA: Insufficient documentation

## 2011-08-06 DIAGNOSIS — Z66 Do not resuscitate: Secondary | ICD-10-CM | POA: Insufficient documentation

## 2011-08-06 HISTORY — DX: Contusion of unspecified lower leg, initial encounter: S80.10XA

## 2011-08-06 HISTORY — DX: Other injury of unspecified body region, initial encounter: T14.8XXA

## 2011-08-06 NOTE — Patient Instructions (Signed)
Local care as discussed  Call if signs of infection etc.

## 2011-08-06 NOTE — Progress Notes (Signed)
  Subjective:    Patient ID: Magdalene Molly, female    DOB: 09-23-1918, 77 y.o.   MRN: [id number]  HPI Patient comes in today for SDA  For acute problem evaluation. With daughter  Ma Rings in to object furniture and hit shin area of right leg about a month ago . Had lots of bruising and swelling le and has improved however has residual bump that is soft over tibia and lateral. Some tenderness  But no change in walking  Or other co . No redness or .   Also asks for DNR forms for at home and other.   Review of Systems NO fever new cough other bleeding   Past history family history social history reviewed in the electronic medical record.        Objective:   Physical Exam WDWN in nad alert has walker Right leg  3 cm raised soft  Hematoma with surrounding skin scaling  No erythema or streaking and no sig tenderness otherwise   LE no bony tenderness leg ankle  Pulses present.       Assessment & Plan:  LE hematoma   Not over a joint and doubt bony injury by context of injury thus x ray may not be helpful but could be done. Expect this take a while to resolve and would not do any aspiration at this area . locl skin care   To continue.  DNR   Forms completed and signs.

## 2011-09-13 ENCOUNTER — Other Ambulatory Visit (INDEPENDENT_AMBULATORY_CARE_PROVIDER_SITE_OTHER): Payer: Medicare Other

## 2011-09-13 ENCOUNTER — Telehealth: Payer: Self-pay | Admitting: *Deleted

## 2011-09-13 DIAGNOSIS — R300 Dysuria: Secondary | ICD-10-CM

## 2011-09-13 LAB — POCT URINALYSIS DIPSTICK
Bilirubin, UA: NEGATIVE
Glucose, UA: NEGATIVE
Ketones, UA: NEGATIVE
Leukocytes, UA: NEGATIVE
Nitrite, UA: NEGATIVE
pH, UA: 7.5

## 2011-09-13 MED ORDER — CIPROFLOXACIN HCL 500 MG PO TABS: 500.0000 mg | ORAL_TABLET | Freq: Two times a day (BID) | ORAL | Status: AC

## 2011-09-13 NOTE — Telephone Encounter (Signed)
Per Dr. Fabian Sharp- this is fine to do. Do ua and culture. Pt has had cipro in the past.

## 2011-09-13 NOTE — Telephone Encounter (Signed)
Daughter [name]) is calling stating her Mom is running a fever of 99.2, and is having urinary incontinence x 1-2 days.  She has dementia so she does not complain of any symptoms.  There is NO way to get her to the office today, but the daughter has collected an Urine specimen, and refrigerated it.  She would like to bring it in to be tested, and treated if necessary.

## 2011-09-13 NOTE — Telephone Encounter (Signed)
Per Dr. Fabian Sharp- can do cipro 500mg  1 bid #14  Rx sent to pharmacy Pt's daughter aware of this.

## 2011-09-18 NOTE — Progress Notes (Signed)
Quick Note:  Per Dr. Clent Ridges- Pt is sensitive to cipro. So she should be okay. Pt's daughter aware of this. She is to call us back if she has anymore problems. ______

## 2011-10-16 ENCOUNTER — Encounter: Payer: Self-pay | Admitting: Internal Medicine

## 2011-10-16 ENCOUNTER — Ambulatory Visit (INDEPENDENT_AMBULATORY_CARE_PROVIDER_SITE_OTHER): Payer: Medicare Other | Admitting: Internal Medicine

## 2011-10-16 VITALS — BP 120/80 | HR 79 | Temp 98.5°F | Wt 138.0 lb

## 2011-10-16 DIAGNOSIS — M129 Arthropathy, unspecified: Secondary | ICD-10-CM

## 2011-10-16 DIAGNOSIS — R05 Cough: Secondary | ICD-10-CM

## 2011-10-16 DIAGNOSIS — N390 Urinary tract infection, site not specified: Secondary | ICD-10-CM

## 2011-10-16 DIAGNOSIS — E785 Hyperlipidemia, unspecified: Secondary | ICD-10-CM

## 2011-10-16 DIAGNOSIS — D649 Anemia, unspecified: Secondary | ICD-10-CM

## 2011-10-16 DIAGNOSIS — I10 Essential (primary) hypertension: Secondary | ICD-10-CM

## 2011-10-16 DIAGNOSIS — M810 Age-related osteoporosis without current pathological fracture: Secondary | ICD-10-CM

## 2011-10-16 DIAGNOSIS — R059 Cough, unspecified: Secondary | ICD-10-CM

## 2011-10-16 MED ORDER — IBANDRONATE SODIUM 150 MG PO TABS: 150.0000 mg | ORAL_TABLET | ORAL | Status: DC

## 2011-10-16 MED ORDER — IBANDRONATE SODIUM 150 MG PO TABS: ORAL_TABLET | ORAL | Status: DC

## 2011-10-16 MED ORDER — BENZONATATE 100 MG PO CAPS: 100.0000 mg | ORAL_CAPSULE | Freq: Two times a day (BID) | ORAL | Status: AC | PRN

## 2011-10-16 MED ORDER — DOXYCYCLINE HYCLATE 100 MG PO CAPS: 100.0000 mg | ORAL_CAPSULE | Freq: Two times a day (BID) | ORAL | Status: AC

## 2011-10-16 MED ORDER — ROSUVASTATIN CALCIUM 10 MG PO TABS: 10.0000 mg | ORAL_TABLET | Freq: Every day | ORAL | Status: DC

## 2011-10-16 NOTE — Patient Instructions (Signed)
Go ahead and add  Doxycyline  For poss bacterial infection.  Care with tessalon  perles  And cns side effects sometimes.

## 2011-10-16 NOTE — Progress Notes (Signed)
  Subjective:    Patient ID: [name], female    DOB: 1918-12-19, 78 y.o.   MRN: [id number]  HPI  Patient comes in today for SDA  For acute problem evaluation. With daughter. Onset with cough about 8 days ago onset with fever and weakness  .   Not getting better  Bad coughing in spasms  .Out of tessalo perles.    Which helped some  No fever known.  Dry cough.  No falling.  Still a bit sick.  Daughter also had resp infecion coughb ut getting better .    No vomiting stonmach ache uti.  Has recent hx of utis   . Some increase frequncy had cipro with help .   Review of Systems Chronic  rhinorrhea  No change no fever  No rashes new  No sob  No edema falling has rollater will need new one as brakes are unfixable. Having increase UI again . No sob chills  But decrease appetite malaise.  Past history family history social history reviewed in the electronic medical record.     Objective:   Physical Exam WD frail alert  wf in nad with rhinorrhea and cough color is good  . Quiet resp. WDWN in NAD  quiet respirations; mildly congested  somewhat hoarse. Non toxic . HEENT: Normocephalic ;atraumatic , Eyes;  PERRL, EOMs  Full, lids and conjunctiva clear,,Ears: no deformities, canals nl, TM landmarks normal, Nose: no deformityclear  Dc  but congested;face minimally tender Mouth : OP clear without lesion or edema . Neck: Supple without adenopathy or masses or bruits Chest:  Clear to A&P without wheezes rales or rhonchi  Coarse bs at both bases  Rare exp wheeze thoncho CV:  S1-S2 no gallops or murmurs peripheral perfusion is normal Skin :nl perfusion and no acute rashes  No clubbing cyanosis or edema  uaing walker well    Assessment & Plan:  Cough persistent coarse bs  / atypicals   Empiric rx with doxy  Caution with cough meds; refilled.  Recurrent utis  Problematic   Consider rx as prn at this pont would not use  Daily prophylaxis cause of poss se.   ? When lab due for monitoring   Will put  order in for lipid bmp tsh  Cbc when due and convenient .    Due fr monitoring labs  Make appt for same and will let her know results  Refill chronic meds as appropriate.

## 2011-11-12 ENCOUNTER — Other Ambulatory Visit: Payer: Self-pay | Admitting: Internal Medicine

## 2012-02-14 ENCOUNTER — Ambulatory Visit (INDEPENDENT_AMBULATORY_CARE_PROVIDER_SITE_OTHER): Payer: Medicare Other | Admitting: Internal Medicine

## 2012-02-14 ENCOUNTER — Encounter: Payer: Self-pay | Admitting: Internal Medicine

## 2012-02-14 VITALS — BP 120/78 | HR 60 | Temp 98.5°F | Wt 137.0 lb

## 2012-02-14 DIAGNOSIS — R0602 Shortness of breath: Secondary | ICD-10-CM

## 2012-02-14 DIAGNOSIS — R5383 Other fatigue: Secondary | ICD-10-CM

## 2012-02-14 DIAGNOSIS — Z79899 Other long term (current) drug therapy: Secondary | ICD-10-CM

## 2012-02-14 DIAGNOSIS — Z87448 Personal history of other diseases of urinary system: Secondary | ICD-10-CM

## 2012-02-14 DIAGNOSIS — R5381 Other malaise: Secondary | ICD-10-CM

## 2012-02-14 DIAGNOSIS — N390 Urinary tract infection, site not specified: Secondary | ICD-10-CM

## 2012-02-14 DIAGNOSIS — E538 Deficiency of other specified B group vitamins: Secondary | ICD-10-CM

## 2012-02-14 LAB — CBC WITH DIFFERENTIAL/PLATELET
Basophils Relative: 0.4 % (ref 0.0–3.0)
Eosinophils Relative: 1.1 % (ref 0.0–5.0)
HCT: 35.3 % — ABNORMAL LOW (ref 36.0–46.0)
Hemoglobin: 11.5 g/dL — ABNORMAL LOW (ref 12.0–15.0)
Lymphs Abs: 1.2 10*3/uL (ref 0.7–4.0)
MCV: 87.6 fl (ref 78.0–100.0)
Monocytes Absolute: 0.6 10*3/uL (ref 0.1–1.0)
Neutro Abs: 6.1 10*3/uL (ref 1.4–7.7)
Neutrophils Relative %: 76.6 % (ref 43.0–77.0)
RBC: 4.03 Mil/uL (ref 3.87–5.11)
WBC: 8 10*3/uL (ref 4.5–10.5)

## 2012-02-14 LAB — POCT URINALYSIS DIPSTICK
Bilirubin, UA: NEGATIVE
Nitrite, UA: NEGATIVE
Protein, UA: NEGATIVE
Urobilinogen, UA: 0.2
pH, UA: 6.5

## 2012-02-14 LAB — HEPATIC FUNCTION PANEL
AST: 17 U/L (ref 0–37)
Albumin: 3.4 g/dL — ABNORMAL LOW (ref 3.5–5.2)
Alkaline Phosphatase: 68 U/L (ref 39–117)

## 2012-02-14 LAB — BASIC METABOLIC PANEL
CO2: 28 mEq/L (ref 19–32)
Chloride: 96 mEq/L (ref 96–112)
Creatinine, Ser: 0.6 mg/dL (ref 0.4–1.2)

## 2012-02-14 LAB — T4, FREE: Free T4: 1.01 ng/dL (ref 0.60–1.60)

## 2012-02-14 LAB — BRAIN NATRIURETIC PEPTIDE: Pro B Natriuretic peptide (BNP): 78 pg/mL (ref 0.0–100.0)

## 2012-02-14 NOTE — Progress Notes (Signed)
  Subjective:    Patient ID: [name], female    DOB: 1918-12-01, 78 y.o.   MRN: [id number]  HPI Patient comes in today  for  new problem evaluation. WIth daughter who  is primary caretaker. She's been noticing to be complaining of more fatigue over the last couple weeks decrease exercise tolerance after walking without increasing cough no fever syncope. Wants to make sure she is okay before she encourage her to her to do more exercise. Appetite ok  Am cough  Continues  And then ok . No wheezing bleeding focal pain she states she feels tired all over. No new UTI symptoms. She was due for laboratory studies in February but never got them done Review of Systems Neg fever falling syncope chest pain  Chills  New utis x no change in nocturia sx  Sleeps 10 - 8  Goes to adult day care. All day..  no chest pain or of dense or change in neurologic status  Past history family history social history reviewed in the electronic medical record.     Objective:   Physical Exam BP 120/78  Pulse 60  Temp 98.5 F (36.9 C)  Wt 137 lb (62.143 kg) WD elderly alert WF in nad  With kyphosis  Ambulatory with own roller walker.   Co of feeling tired all over  When  questioned drippping nose  ocass dry superfical cough HEENT: [place] right eac min wax tm clear left wax in eac  Nares clear drip Neck: without adenopathy or masses or bruits Chest:  Clear to A&P without wheezes rales or rhonchi mod kyphosis CV:  S1-S2 no gallops or murmurs peripheral perfusion is normal ext tr 1+ edema  Ext OA changes Skin non icteric and no bleeding. Neuro alert no tremor or focal weakness can geg on table  On her own  UA:   mod blood and trc leuk     EKG shows sinus rhythm ;voltage criteria for LVH but no acute changes last echo 2009 showed normal LV function. No EKG to compare Assessment & Plan:  Change in exercise tolerance . C/o of fatigue but not spcific  Over the last months.  has chronic  Rhinitis drip  And dry eyes.  Kyphosis /osteoporosis can contribute to respiratory capacity. Non focal exam  Has chronic am cough  R/o anemia/ metabol due for labs anyway Chest x ray  Suggest stop the crestor in case any contributor to her symptoms and low benefit in this situation. Labs today .   Consider repeat echo if appropriate.

## 2012-02-14 NOTE — Patient Instructions (Signed)
Will notify you  of labs when available. Stop  The crestor   Get  Chest x ray  If unrevealing we may repeat another echo test.

## 2012-02-17 ENCOUNTER — Other Ambulatory Visit: Payer: Self-pay | Admitting: Internal Medicine

## 2012-02-17 LAB — URINE CULTURE

## 2012-02-19 ENCOUNTER — Other Ambulatory Visit: Payer: Self-pay | Admitting: Family Medicine

## 2012-02-19 MED ORDER — DONEPEZIL HCL 10 MG PO TABS: 10.0000 mg | ORAL_TABLET | Freq: Every day | ORAL | Status: DC

## 2012-02-19 NOTE — Telephone Encounter (Signed)
This medication was sent to the pharmacy #[id number] with 0 refills.

## 2012-02-20 ENCOUNTER — Other Ambulatory Visit: Payer: Self-pay | Admitting: Family Medicine

## 2012-02-20 DIAGNOSIS — R899 Unspecified abnormal finding in specimens from other organs, systems and tissues: Secondary | ICD-10-CM

## 2012-02-20 MED ORDER — CEPHALEXIN 500 MG PO CAPS: 500.0000 mg | ORAL_CAPSULE | Freq: Three times a day (TID) | ORAL | Status: AC

## 2012-02-20 NOTE — Telephone Encounter (Signed)
Pt's labs came back and she has low iron.  The daughter would like to know if/could she start back on the iron supplements.  Please advise.

## 2012-03-24 ENCOUNTER — Emergency Department (HOSPITAL_COMMUNITY): Payer: Medicare Other

## 2012-03-24 ENCOUNTER — Emergency Department (HOSPITAL_COMMUNITY)
Admission: EM | Admit: 2012-03-24 | Discharge: 2012-03-25 | Disposition: A | Discharge status: Home / Self Care | Payer: Medicare Other | Attending: Emergency Medicine | Admitting: Emergency Medicine

## 2012-03-24 ENCOUNTER — Encounter (HOSPITAL_COMMUNITY): Payer: Self-pay

## 2012-03-24 DIAGNOSIS — Z79899 Other long term (current) drug therapy: Secondary | ICD-10-CM | POA: Insufficient documentation

## 2012-03-24 DIAGNOSIS — M810 Age-related osteoporosis without current pathological fracture: Secondary | ICD-10-CM | POA: Insufficient documentation

## 2012-03-24 DIAGNOSIS — R5381 Other malaise: Secondary | ICD-10-CM | POA: Insufficient documentation

## 2012-03-24 DIAGNOSIS — I10 Essential (primary) hypertension: Secondary | ICD-10-CM | POA: Insufficient documentation

## 2012-03-24 DIAGNOSIS — R51 Headache: Secondary | ICD-10-CM | POA: Insufficient documentation

## 2012-03-24 DIAGNOSIS — R531 Weakness: Secondary | ICD-10-CM

## 2012-03-24 DIAGNOSIS — Z85828 Personal history of other malignant neoplasm of skin: Secondary | ICD-10-CM | POA: Insufficient documentation

## 2012-03-24 DIAGNOSIS — Z7982 Long term (current) use of aspirin: Secondary | ICD-10-CM | POA: Insufficient documentation

## 2012-03-24 LAB — URINALYSIS, ROUTINE W REFLEX MICROSCOPIC
Glucose, UA: NEGATIVE mg/dL
Ketones, ur: NEGATIVE mg/dL
Leukocytes, UA: NEGATIVE
pH: 7 (ref 5.0–8.0)

## 2012-03-24 LAB — BASIC METABOLIC PANEL
CO2: 28 mEq/L (ref 19–32)
Chloride: 92 mEq/L — ABNORMAL LOW (ref 96–112)
Creatinine, Ser: 0.73 mg/dL (ref 0.50–1.10)
Glucose, Bld: 158 mg/dL — ABNORMAL HIGH (ref 70–99)

## 2012-03-24 LAB — CBC WITH DIFFERENTIAL/PLATELET
Basophils Absolute: 0 10*3/uL (ref 0.0–0.1)
HCT: 36.9 % (ref 36.0–46.0)
Hemoglobin: 12.4 g/dL (ref 12.0–15.0)
Lymphocytes Relative: 11 % — ABNORMAL LOW (ref 12–46)
Lymphs Abs: 1.1 10*3/uL (ref 0.7–4.0)
Monocytes Absolute: 0.6 10*3/uL (ref 0.1–1.0)
Neutro Abs: 7.8 10*3/uL — ABNORMAL HIGH (ref 1.7–7.7)
RBC: 4.34 MIL/uL (ref 3.87–5.11)
RDW: 15.2 % (ref 11.5–15.5)
WBC: 9.5 10*3/uL (ref 4.0–10.5)

## 2012-03-24 LAB — POCT I-STAT TROPONIN I: Troponin i, poc: 0 ng/mL (ref 0.00–0.08)

## 2012-03-24 LAB — CARDIAC PANEL(CRET KIN+CKTOT+MB+TROPI)
CK, MB: 2.6 ng/mL (ref 0.3–4.0)
Relative Index: INVALID (ref 0.0–2.5)
Troponin I: <0.3 ng/mL (ref <0.30)

## 2012-03-24 NOTE — ED Notes (Signed)
Pt with mild headache most of the day, back pain, mild nausea, weakness, had difficulty trying to get to the bathroom, normally uses a walker

## 2012-03-24 NOTE — ED Provider Notes (Signed)
Medical screening examination/treatment/procedure(s) were conducted as a shared visit with non-physician practitioner(s) and myself.  I personally evaluated the patient during the encounter  Few days mild cough, no fever/SOBr, has baseline dementia poor short term memory, today few hours general weakness gradual onset as well as gradual onset headache both of which have resolved now, no sudden onset headache, no severe headache at onset, no CP/SOBr, no change speech/vision/swallow/understanding, no focal or lateralizing weak/numbness, resps unlabored and L:CTA, Pt feels back to baseline in ED.  Hurman Horn, MD 03/25/12 2053

## 2012-03-24 NOTE — ED Notes (Signed)
Patient transported to X-ray 

## 2012-03-24 NOTE — ED Provider Notes (Signed)
History     CSN: [id number]  Arrival date & time 03/24/12  1339   First MD Initiated Contact with Patient 03/24/12 1346      Chief Complaint  Patient presents with  . Weakness    (Consider location/radiation/quality/duration/timing/severity/associated sxs/prior treatment) HPI HM brought by EMS to ER and presents with daughter. Should come to the emergency department with complaints of headache, generalized weakness, increased urinary incontinence, coughing for 2 days and chronic back pain. The patient's mom states that she has been out of town and left her mother with a very reliable caregiver. Today the patient was walking to the bathroom with her walker and was unable to make it because she became too weak she sat down the couch kind of slumped over. The patient's talar. The patient's son-in-law the pulmonologist he listened to her lungs and said they sounded clear. The patient is been diagnosed with a UTI one month ago she finished treatment and had been feeling better. During lab work during a physical exam room one month ago due to generalized weakness that had been increasing the patient was noted to have an elevated sed rate for which she was referred to a rheumatologist and diagnosed with PMR. She was started on prednisone and since then has been doing much better. Patient t is awake and alert not appear toxic.She is answering questions appropriately.    Past Medical History  Diagnosis Date  . Anemia   . Hyperlipidemia   . Hypertension   . Seizure disorder     poss from hponatremia when had pneumonia  . Pneumonia 09/2002    2012  . Recurrent UTI   . Urinary incontinence     mild  . Osteoporosis     Grade II spondylotistesis at l4-l5 testing in [place] 2008 and confirmed that she has osteoporosis  . Spondylolisthesis of lumbar region     Grade II spondylotistesis at l4-l5 testing in [place] 2008   . Skin cancer 2012    leg     Past Surgical History  Procedure Date  . Breast  surgery     rt and lf, had fibrofatty tissue    History reviewed. No pertinent family history.  History  Substance Use Topics  . Smoking status: Never Smoker   . Smokeless tobacco: Not on file  . Alcohol Use: No    OB History    Grav Para Term Preterm Abortions TAB SAB Ect Mult Living   4 4              Review of Systems  CONSTITUTIONAL: + increase weakness HEENT: denies blurry vision or change in hearing PULMONARY: Denies difficulty breathing and SOB, + cough for 2 days CARDIAC: denies chest pain or heart palpitations MUSCULOSKELETAL:  denies being unable to ambulate ABDOMEN AL: denies abdominal pain GU: denies loss of bowel, + increased urinary incontience NEURO: denies numbness and tingling in extremities, + headache SKIN: no new rashes PSYCH: patient denies anxiety or depression, + dementia at baseline NECK: Pt denies having neck pain      Allergies  Betimol; Codeine; Gabapentin; and Sulfonamide derivatives  Home Medications   Current Outpatient Rx  Name Route Sig Dispense Refill  . ASPIRIN 81 MG PO TABS Oral Take 81 mg by mouth daily. Every other day    . CALCIUM CARBONATE-VITAMIN D 250-125 MG-UNIT PO TABS Oral Take 1 tablet by mouth daily.      . CYCLOSPORINE 0.05 % OP EMUL  1 drop 2 (two) times  daily.      . DONEPEZIL HCL 10 MG PO TABS Oral Take 1 tablet (10 mg total) by mouth at bedtime. 90 tablet 0  . FERROUS SULFATE 325 (65 FE) MG PO TABS Oral Take 325 mg by mouth daily with breakfast.    . IBANDRONATE SODIUM 150 MG PO TABS Oral Take 150 mg by mouth every 30 (thirty) days. Take in the morning with a full glass of water, on an empty stomach, and do not take anything else by mouth or lie down for the next 30 min. - on the 1st Sunday of each month    . ADULT MULTIVITAMIN W/MINERALS CH Oral Take 1 tablet by mouth daily.    [name] OP Both Eyes Place 1 drop into both eyes as needed. For dry eyes    . PREDNISONE 10 MG PO TABS Oral Take 10 mg by mouth daily.  10 mg daily for 6 weeks, 9 mg for 30 days (starting 7/21?) then decrease by 1 mg every 30 days thereafter    . EYE DROPS OP Both Eyes Place 1 drop into both eyes 4 (four) times daily.      BP 134/39  Pulse 72  Temp 97.8 F (36.6 C)  Resp 22  SpO2 100%  Physical Exam  Nursing note and vitals reviewed. Constitutional: She appears well-developed and well-nourished. No distress.  HENT:  Head: Normocephalic and atraumatic.  Eyes: Pupils are equal, round, and reactive to light.  Neck: Normal range of motion. Neck supple.  Cardiovascular: Normal rate and regular rhythm.   Pulmonary/Chest: Effort normal. She has no wheezes. She has no rales.       + coughing during exam  Abdominal: Soft. There is no tenderness. There is no rebound and no guarding.  Neurological: She is alert.  Skin: Skin is warm and dry.    ED Course  Procedures (including critical care time)  Labs Reviewed  CBC WITH DIFFERENTIAL - Abnormal; Notable for the following:    Neutrophils Relative 82 (*)     Neutro Abs 7.8 (*)     Lymphocytes Relative 11 (*)     All other components within normal limits  BASIC METABOLIC PANEL - Abnormal; Notable for the following:    Sodium 129 (*)     Chloride 92 (*)     Glucose, Bld 158 (*)     GFR calc non Af Amer 72 (*)     GFR calc Af Amer 84 (*)     All other components within normal limits  URINALYSIS, ROUTINE W REFLEX MICROSCOPIC  CARDIAC PANEL(CRET KIN+CKTOT+MB+TROPI)  LACTIC ACID, PLASMA  POCT I-STAT TROPONIN I  POCT I-STAT TROPONIN I  LAB REPORT - SCANNED   Dg Chest 2 View  03/24/2012  *RADIOLOGY REPORT*  Clinical Data: Cough, weakness  CHEST - 2 VIEW  Comparison: 09/06/2010  Findings: Low lung volumes.  Mild cardiomegaly with vascular and interstitial prominence.  No definite CHF or edema.  Minimal basilar atelectasis.  No effusion or pneumothorax.  Atherosclerosis noted of the aorta.  Vertebral augmentation changes in the lower thoracic region.  Stable exam.   IMPRESSION: Stable chest exam.  No superimposed acute process  Original Report Authenticated By: [name], M.D.   Ct Head Wo Contrast  03/24/2012  *RADIOLOGY REPORT*  Clinical Data: 78 year old female with headache, nausea, weakness.  CT HEAD WITHOUT CONTRAST  Technique:  Contiguous axial images were obtained from the base of the skull through the vertex without contrast.  Comparison:  07/16/2008.  Findings: Minor ethmoid sinus mucosal thickening.  Other Visualized paranasal sinuses and mastoids are clear.  No acute osseous abnormality identified.  Calcified atherosclerosis at the skull base.  No acute orbit or scalp soft tissue findings.  Stable cerebral volume.  No ventriculomegaly. No midline shift, mass effect, or evidence of mass lesion.  No acute intracranial hemorrhage identified.  Chronic inferior right cerebellar infarct is stable.  Chronic cerebral white matter hypodensity is stable, maximal the left centrum semiovale as before.  Small hypodense focus in the left thalamus is new since 2009 but appears chronic. No suspicious intracranial vascular hyperdensity. No evidence of cortically based acute infarction identified.  IMPRESSION: Chronic small and medium-sized vessel ischemia with some progression since 2009 suspected. No acute intracranial abnormality.  Original Report Authenticated By: Harley Hallmark, M.D.     No diagnosis found.    MDM   Date: 03/24/2012  Rate: 73  Rhythm: normal sinus rhythm  QRS Axis: normal  Intervals: normal  ST/T Wave abnormalities: normal  Conduction Disutrbances:none  Narrative Interpretation:  LVH  Old EKG Reviewed: unchanged no significant change from Nov 2009   First Troponin negative. Delta Troponin ordred for 7pm  Pt hand-off to [employer], PA-C to discharge and delta Troponin results    Dorthula Matas, PA 03/25/12 1603

## 2012-03-25 NOTE — ED Notes (Signed)
See downtime charting. 

## 2012-04-01 NOTE — ED Provider Notes (Signed)
78 year old female presenting with chief complaint of weakness. Patient is signed out Ellin Saba [facility] and attending Dr. Fonnie Jarvis. Plan is to DC patient once six-hour troponin is drawn and resulted as negative. Power outage prevents review of earlier drawn labs. Dr. Fonnie Jarvis reports that all of the labs are normal.  Dr. troponin is negative patient vital signs stable  Safe  to d/c to home. Patient will be staying with son who is M.D. Return precautions given  Pt verbalized understanding and agrees with care plan. Outpatient follow-up and return precautions given.     Wynetta Emery, PA-C 04/01/12 [phone]

## 2012-04-04 NOTE — ED Provider Notes (Signed)
Medical screening examination/treatment/procedure(s) were performed by non-physician practitioner and as supervising physician I was immediately available for consultation/collaboration.   Detrell Umscheid, MD 04/04/12 2314 

## 2012-05-17 ENCOUNTER — Ambulatory Visit: Payer: Medicare Other | Attending: Rheumatology | Admitting: Rehabilitative and Restorative Service Providers"

## 2012-05-17 DIAGNOSIS — R269 Unspecified abnormalities of gait and mobility: Secondary | ICD-10-CM | POA: Insufficient documentation

## 2012-05-17 DIAGNOSIS — IMO0001 Reserved for inherently not codable concepts without codable children: Secondary | ICD-10-CM | POA: Insufficient documentation

## 2012-05-19 ENCOUNTER — Other Ambulatory Visit: Payer: Self-pay | Admitting: Internal Medicine

## 2012-05-24 ENCOUNTER — Ambulatory Visit: Payer: Medicare Other | Admitting: Physical Therapy

## 2012-05-29 ENCOUNTER — Ambulatory Visit: Payer: Medicare Other | Admitting: *Deleted

## 2012-05-31 ENCOUNTER — Ambulatory Visit: Payer: Medicare Other | Admitting: Rehabilitative and Restorative Service Providers"

## 2012-06-03 ENCOUNTER — Ambulatory Visit: Payer: Medicare Other | Admitting: Rehabilitative and Restorative Service Providers"

## 2012-06-07 ENCOUNTER — Ambulatory Visit: Payer: Medicare Other | Attending: Rheumatology | Admitting: Rehabilitative and Restorative Service Providers"

## 2012-06-07 DIAGNOSIS — IMO0001 Reserved for inherently not codable concepts without codable children: Secondary | ICD-10-CM | POA: Insufficient documentation

## 2012-06-07 DIAGNOSIS — R269 Unspecified abnormalities of gait and mobility: Secondary | ICD-10-CM | POA: Insufficient documentation

## 2012-06-10 ENCOUNTER — Ambulatory Visit: Payer: Medicare Other | Admitting: Rehabilitative and Restorative Service Providers"

## 2012-06-12 ENCOUNTER — Ambulatory Visit: Payer: Medicare Other | Admitting: Rehabilitative and Restorative Service Providers"

## 2012-06-17 ENCOUNTER — Ambulatory Visit: Payer: Medicare Other | Admitting: Rehabilitative and Restorative Service Providers"

## 2012-06-19 ENCOUNTER — Ambulatory Visit: Payer: Medicare Other | Admitting: Rehabilitative and Restorative Service Providers"

## 2012-06-21 ENCOUNTER — Ambulatory Visit (INDEPENDENT_AMBULATORY_CARE_PROVIDER_SITE_OTHER): Payer: Medicare Other | Admitting: Internal Medicine

## 2012-06-21 ENCOUNTER — Encounter: Payer: Self-pay | Admitting: Internal Medicine

## 2012-06-21 VITALS — BP 158/70 | HR 97 | Temp 98.5°F | Wt 143.0 lb

## 2012-06-21 DIAGNOSIS — Z862 Personal history of diseases of the blood and blood-forming organs and certain disorders involving the immune mechanism: Secondary | ICD-10-CM

## 2012-06-21 DIAGNOSIS — I10 Essential (primary) hypertension: Secondary | ICD-10-CM

## 2012-06-21 DIAGNOSIS — R030 Elevated blood-pressure reading, without diagnosis of hypertension: Secondary | ICD-10-CM | POA: Insufficient documentation

## 2012-06-21 DIAGNOSIS — F068 Other specified mental disorders due to known physiological condition: Secondary | ICD-10-CM

## 2012-06-21 DIAGNOSIS — M353 Polymyalgia rheumatica: Secondary | ICD-10-CM | POA: Insufficient documentation

## 2012-06-21 DIAGNOSIS — N390 Urinary tract infection, site not specified: Secondary | ICD-10-CM

## 2012-06-21 DIAGNOSIS — Z23 Encounter for immunization: Secondary | ICD-10-CM

## 2012-06-21 LAB — POCT URINALYSIS DIP (MANUAL ENTRY)
Bilirubin, UA: NEGATIVE
Nitrite, UA: NEGATIVE
Spec Grav, UA: 1.015
Urobilinogen, UA: 0.2
pH, UA: 8

## 2012-06-21 LAB — BASIC METABOLIC PANEL
BUN: 16 mg/dL (ref 6–23)
CO2: 30 mEq/L (ref 19–32)
Calcium: 9.6 mg/dL (ref 8.4–10.5)
GFR: 83.28 mL/min (ref >60.00)
Glucose, Bld: 89 mg/dL (ref 70–99)

## 2012-06-21 MED ORDER — AMLODIPINE BESYLATE 2.5 MG PO TABS: 2.5000 mg | ORAL_TABLET | Freq: Every day | ORAL | Status: DC

## 2012-06-21 NOTE — Patient Instructions (Signed)
If bp continues above 145 150   Been  2.5 mg   Amlodipine per day.  For bp .

## 2012-06-21 NOTE — Progress Notes (Signed)
  Subjective:    Patient ID: [name], female    DOB: 1918/12/11, 78 y.o.   MRN: [id number]  HPI Patient comes in today for follow up of  multiple medical problems. Daughter is here [facility] ROA But most concern about elevated bp reading at the  Dr Dareen Piano an other  Not checking  Hx of bp med that caused se  When in hosp in past ? Which . No new meds otherwise or rapid change in health. PMR on 6 mg pred to dec  To 6 next month  Never got fu bmp ordered.  Needs flu vaccine Not coughin or wheezing at this time.  Review of Systems Neg cp sob cough and wheeze has gained some weight eating more. No recent fall. Hs chronic rhinorrhea Has increase urinary frequency and ui  Cognition about the same eyes bother her.  As per hpi  Past history family history social history reviewed in the electronic medical record.    Objective:   Physical Exam BP 158/70  Pulse 97  Temp 98.5 F (36.9 C) (Oral)  Wt 143 lb (64.864 kg)  SpO2 98% wdwn in nad ambulatory with walker  Here with daughter  Non toxic.  Drippy nose  pleasant Neck: Supple without adenopathy or masses or bruits Chest:  Clear to A&P without wheezes rales or rhonchi CV:  S1-S2 no gallops or murmurs peripheral perfusion is normal RR Repeat bp left arm 158/60 ? [place] but not when taking pulse  No clubbing cyanosis  Nl perfusion     Assessment & Plan:   Elevated Bp  Hx of hypertension off med when went low?  Se of past med  . Inc today poss from weight gain  And pred  Disc options  Recheck and if 150 and above range can begin low dose amlodipineConcern about ace and diuretic at this time     And se profile for her.caution PMR  On a wean doing better  Still on pred 7 mg and weaning Urinary sx ongoing hx of uti check ua today Dementia  Hx of hyponatremia  Recheck  lab bmp esr and ua  R/o uti Ok for flu vaccine

## 2012-06-25 ENCOUNTER — Ambulatory Visit: Payer: Medicare Other | Admitting: Rehabilitative and Restorative Service Providers"

## 2012-06-26 ENCOUNTER — Telehealth: Payer: Self-pay | Admitting: Internal Medicine

## 2012-06-26 NOTE — Telephone Encounter (Signed)
Patient's daughter called stating that they would like to start getting her mom's benazipril through [employer]. Please assist. And refill

## 2012-06-28 ENCOUNTER — Telehealth: Payer: Self-pay | Admitting: Internal Medicine

## 2012-06-28 ENCOUNTER — Other Ambulatory Visit: Payer: Self-pay | Admitting: Internal Medicine

## 2012-06-28 ENCOUNTER — Ambulatory Visit: Payer: Medicare Other | Admitting: Rehabilitative and Restorative Service Providers"

## 2012-06-28 MED ORDER — DONEPEZIL HCL 10 MG PO TABS: 10.0000 mg | ORAL_TABLET | Freq: Every day | ORAL | Status: DC

## 2012-06-28 NOTE — Telephone Encounter (Signed)
Pts daughter called and said that pt needs refill of donepezil (ARICEPT) 10 MG tablet 90 day supply sent in to [facility] mail order pharmacy.

## 2012-06-28 NOTE — Telephone Encounter (Signed)
Aricept sent to the pharmacy

## 2012-06-28 NOTE — Telephone Encounter (Signed)
Sent to [facility] by e-scribe. 

## 2012-07-02 ENCOUNTER — Ambulatory Visit: Payer: Medicare Other | Admitting: Rehabilitative and Restorative Service Providers"

## 2012-07-05 ENCOUNTER — Ambulatory Visit: Payer: Medicare Other | Attending: Rheumatology | Admitting: Rehabilitative and Restorative Service Providers"

## 2012-07-05 DIAGNOSIS — IMO0001 Reserved for inherently not codable concepts without codable children: Secondary | ICD-10-CM | POA: Insufficient documentation

## 2012-07-05 DIAGNOSIS — R269 Unspecified abnormalities of gait and mobility: Secondary | ICD-10-CM | POA: Insufficient documentation

## 2012-07-09 ENCOUNTER — Ambulatory Visit: Payer: Medicare Other | Admitting: Rehabilitative and Restorative Service Providers"

## 2012-07-12 ENCOUNTER — Encounter: Payer: Medicare Other | Admitting: Rehabilitative and Restorative Service Providers"

## 2012-07-16 ENCOUNTER — Ambulatory Visit: Payer: Medicare Other | Admitting: Rehabilitative and Restorative Service Providers"

## 2012-07-19 ENCOUNTER — Ambulatory Visit: Payer: Medicare Other | Admitting: Rehabilitative and Restorative Service Providers"

## 2012-07-23 ENCOUNTER — Ambulatory Visit: Payer: Medicare Other | Admitting: Rehabilitative and Restorative Service Providers"

## 2012-07-26 ENCOUNTER — Ambulatory Visit: Payer: Medicare Other | Admitting: Rehabilitative and Restorative Service Providers"

## 2012-07-31 ENCOUNTER — Encounter: Payer: Medicare Other | Admitting: *Deleted

## 2012-08-05 ENCOUNTER — Ambulatory Visit: Payer: Medicare Other | Attending: Rheumatology | Admitting: Rehabilitative and Restorative Service Providers"

## 2012-08-05 DIAGNOSIS — R269 Unspecified abnormalities of gait and mobility: Secondary | ICD-10-CM | POA: Insufficient documentation

## 2012-08-05 DIAGNOSIS — IMO0001 Reserved for inherently not codable concepts without codable children: Secondary | ICD-10-CM | POA: Insufficient documentation

## 2012-08-09 ENCOUNTER — Ambulatory Visit: Payer: Medicare Other | Admitting: Rehabilitative and Restorative Service Providers"

## 2012-08-19 ENCOUNTER — Telehealth: Payer: Self-pay | Admitting: Internal Medicine

## 2012-08-19 NOTE — Telephone Encounter (Signed)
Patient Information:  Caller Name: [name]  Phone: [phone]  Patient: [name]  Gender: Female  DOB: 02/26/1919  Age: 78 Years  PCP: Berniece Andreas (Family Practice)  Office Follow Up:  Does the office need to follow up with this patient?: No  Instructions For The Office: N/A   Symptoms  Reason For Call & Symptoms: Power of Attorney calling and states patient has been more confused since "last week". Is wanting to know if could bring urine specimen to office as could not get if came in office. Patient has dementia. Is not voiding more frequently. Is unable to tell family if has pain.  Reviewed Health History In EMR: Yes  Reviewed Medications In EMR: Yes  Reviewed Allergies In EMR: Yes  Reviewed Surgeries / Procedures: Yes  Date of Onset of Symptoms: 08/12/2012  Guideline(s) Used:  Confusion - Delirium  Disposition Per Guideline:   See Today or Tomorrow in Office  Reason For Disposition Reached:   Longstanding confusion (e.g., dementia, stroke) and worsening  Advice Given:  N/A  Appointment Scheduled:  08/20/2012 04:00:00 Appointment Scheduled Provider:  Adline Mango [facility])

## 2012-08-20 ENCOUNTER — Ambulatory Visit: Payer: Self-pay | Admitting: Family

## 2012-08-26 ENCOUNTER — Telehealth: Payer: Self-pay | Admitting: Internal Medicine

## 2012-08-26 NOTE — Telephone Encounter (Signed)
Pt may have UTI.  Daughter is concerned mom will not get a "clean catch" here in the office. She says first thing in the AM when pt takes her shower would be the best , then she would like to bring speciman in.  Daughter, Olena Leatherwood [phone] would like Dr Fabian Sharp to call her and discuss.

## 2012-08-27 NOTE — Telephone Encounter (Signed)
Left message on machine for daughter returning our call

## 2012-08-27 NOTE — Telephone Encounter (Signed)
Spoke with daughter.  Appointment made and a urine sample will be brought from home.

## 2012-08-30 ENCOUNTER — Encounter: Payer: Self-pay | Admitting: Internal Medicine

## 2012-08-30 ENCOUNTER — Ambulatory Visit (INDEPENDENT_AMBULATORY_CARE_PROVIDER_SITE_OTHER): Payer: Medicare Other | Admitting: Internal Medicine

## 2012-08-30 VITALS — BP 136/70 | HR 80 | Temp 98.6°F | Wt 144.0 lb

## 2012-08-30 DIAGNOSIS — R2681 Unsteadiness on feet: Secondary | ICD-10-CM

## 2012-08-30 DIAGNOSIS — M353 Polymyalgia rheumatica: Secondary | ICD-10-CM

## 2012-08-30 DIAGNOSIS — R269 Unspecified abnormalities of gait and mobility: Secondary | ICD-10-CM

## 2012-08-30 DIAGNOSIS — R21 Rash and other nonspecific skin eruption: Secondary | ICD-10-CM

## 2012-08-30 DIAGNOSIS — R32 Unspecified urinary incontinence: Secondary | ICD-10-CM

## 2012-08-30 DIAGNOSIS — R300 Dysuria: Secondary | ICD-10-CM

## 2012-08-30 DIAGNOSIS — F068 Other specified mental disorders due to known physiological condition: Secondary | ICD-10-CM

## 2012-08-30 LAB — CBC WITH DIFFERENTIAL/PLATELET
Basophils Absolute: 0 10*3/uL (ref 0.0–0.1)
Basophils Relative: 0.1 % (ref 0.0–3.0)
Eosinophils Relative: 0.2 % (ref 0.0–5.0)
HCT: 39.2 % (ref 36.0–46.0)
Hemoglobin: 13.2 g/dL (ref 12.0–15.0)
Lymphocytes Relative: 7.5 % — ABNORMAL LOW (ref 12.0–46.0)
Lymphs Abs: 0.9 10*3/uL (ref 0.7–4.0)
Monocytes Relative: 4.1 % (ref 3.0–12.0)
Neutro Abs: 10.3 10*3/uL — ABNORMAL HIGH (ref 1.4–7.7)
RBC: 4.28 Mil/uL (ref 3.87–5.11)
RDW: 13.5 % (ref 11.5–14.6)
WBC: 11.7 10*3/uL — ABNORMAL HIGH (ref 4.5–10.5)

## 2012-08-30 LAB — POCT URINALYSIS DIPSTICK
Bilirubin, UA: NEGATIVE
Ketones, UA: NEGATIVE
Leukocytes, UA: NEGATIVE
Protein, UA: NEGATIVE
Spec Grav, UA: 1.015

## 2012-08-30 LAB — BASIC METABOLIC PANEL
Calcium: 9.6 mg/dL (ref 8.4–10.5)
GFR: 70.34 mL/min (ref >60.00)
Glucose, Bld: 100 mg/dL — ABNORMAL HIGH (ref 70–99)
Potassium: 4.1 mEq/L (ref 3.5–5.1)
Sodium: 134 mEq/L — ABNORMAL LOW (ref 135–145)

## 2012-08-30 NOTE — Progress Notes (Signed)
Chief Complaint  Patient presents with  . Memory Loss  . Dysuria    HPI: Patient comes in today  for  new problem evaluation. Most recently not as perky and cognitive decline  Without fever but some incontinence  . No sig cough  Has chronic runny nose .  Is on a wean for her pmr to 5 mg and then 4 mg pred . Has had some moaning since then  uncertain if infection could be causing any of these sx .   ROS: See pertinent positives and negatives per HPI. No bleeding no new fall  No edema  Past Medical History  Diagnosis Date  . Anemia   . Hyperlipidemia   . Hypertension   . Seizure disorder     poss from hponatremia when had pneumonia  . Pneumonia 09/2002    2012  . Recurrent UTI   . Urinary incontinence     mild  . Osteoporosis     Grade II spondylotistesis at l4-l5 testing in [place] 2008 and confirmed that she has osteoporosis  . Spondylolisthesis of lumbar region     Grade II spondylotistesis at l4-l5 testing in [place] 2008   . Skin cancer 2012    leg   . Contusion of leg 08/06/2011  . Hematoma 08/06/2011    leg near shin no bov underlying bony abnormality and no infection. should take a while to resolve observe for infection     No family history on file.  History   Social History  . Marital Status: Widowed    Spouse Name: N/A    Number of Children: N/A  . Years of Education: N/A   Social History Main Topics  . Smoking status: Never Smoker   . Smokeless tobacco: None  . Alcohol Use: No  . Drug Use: No  . Sexually Active: None   Other Topics Concern  . None   Social History Narrative   Moved from [place] to daughter's [facility] of test conducted in 2002 and [place] to adult day care in day    Outpatient Encounter Prescriptions as of 08/30/2012  Medication Sig Dispense Refill  . amLODipine (NORVASC) 2.5 MG tablet Take 1 tablet (2.5 mg total) by mouth daily.  30 tablet  5  . Artificial Tear Solution (TEARS NATURALE OP) Apply to eye.      Marland Kitchen aspirin 81 MG tablet  Take 81 mg by mouth daily. Every other day      . calcium-vitamin D (OSCAL WITH D 250-125) 250-125 MG-UNIT per tablet Take 1 tablet by mouth daily.        . cycloSPORINE (RESTASIS) 0.05 % ophthalmic emulsion 1 drop 2 (two) times daily.        Marland Kitchen donepezil (ARICEPT) 10 MG tablet Take 1 tablet (10 mg total) by mouth at bedtime.  90 tablet  3  . ibandronate (BONIVA) 150 MG tablet Take 150 mg by mouth every 30 (thirty) days. Take in the morning with a full glass of water, on an empty stomach, and do not take anything else by mouth or lie down for the next 30 min. - on the 1st Sunday of each month      . Multiple Vitamin (MULTIVITAMIN WITH MINERALS) TABS Take 1 tablet by mouth daily.      . predniSONE (DELTASONE) 10 MG tablet Take 4 mg by mouth daily. 10 mg daily for 6 weeks, 9 mg for 30 days (starting 7/21?) then decrease by 1 mg every 30 days thereafter  EXAM:  BP 136/70  Pulse 80  Temp 98.6 F (37 C) (Oral)  Wt 144 lb (65.318 kg)  SpO2 96%  There is no height on file to calculate BMI.  GENERAL: vitals reviewed and listed above, alert, frail but independent ambulatory with walker  appears well hydrated and in no acute distress  HEENT: atraumatic, conjunctiva  clear, no obvious abnormalities on inspection of external nose and ears runny nose  tms intact no redness  OP : no lesion edema or exudate   NECK: no obvious masses on inspection palpation   LUNGS: clear to auscultation bilaterally, no wheezes, rales or rhonchi, good air movement  CV: HRRR, no clubbing cyanosis or  peripheral edema nl cap refill   MS: moves all extremities without noticeable focal  Abnormality has obv OA changes   PSYCH: pleasant and cooperative, not oriented   ua clear today collected cc at home  ASSESSMENT AND PLAN:  Discussed the following assessment and plan:  1. Dysuria  POC Urinalysis Dipstick, Urine culture, Urine culture, Basic metabolic panel, CBC with Differential, C-reactive protein  2.  Urinary incontinence  POC Urinalysis Dipstick, Urine culture, Urine culture, Basic metabolic panel, CBC with Differential, C-reactive protein  3. DEMENTIA, MILD  POC Urinalysis Dipstick, Urine culture, Urine culture, Basic metabolic panel, CBC with Differential, C-reactive protein  4. Unsteadiness  POC Urinalysis Dipstick, Urine culture, Urine culture, Basic metabolic panel, CBC with Differential, C-reactive protein  5. PMR (polymyalgia rheumatica)  POC Urinalysis Dipstick, Urine culture, Urine culture, Basic metabolic panel, CBC with Differential, C-reactive protein   R/o infection or reversible causes   Had slight low Na in past will recheck bmp and esr urine cx could be decline related to reduced dosing of prednisone  -Patient advised to return or notify health care team  immediately if symptoms worsen or persist or new concerns arise.  Patient Instructions  No obvious infection today but will do urine culture.   An contact you about results. It is possible that the decrease in prednisone has caused some physical  Well being changes so contact dr Dareen Piano a call about this.  Vs are good today.         Neta Mends. Panosh M.D.

## 2012-08-30 NOTE — Patient Instructions (Signed)
No obvious infection today but will do urine culture.   An contact you about results. It is possible that the decrease in prednisone has caused some physical  Well being changes so contact dr Dareen Piano a call about this.  Vs are good today.

## 2012-08-31 DIAGNOSIS — R32 Unspecified urinary incontinence: Secondary | ICD-10-CM | POA: Insufficient documentation

## 2012-08-31 DIAGNOSIS — R2681 Unsteadiness on feet: Secondary | ICD-10-CM | POA: Insufficient documentation

## 2012-09-02 LAB — URINE CULTURE

## 2012-09-03 ENCOUNTER — Telehealth: Payer: Self-pay | Admitting: Internal Medicine

## 2012-09-03 ENCOUNTER — Other Ambulatory Visit: Payer: Self-pay | Admitting: Internal Medicine

## 2012-09-03 MED ORDER — CEPHALEXIN 500 MG PO CAPS: 500.0000 mg | ORAL_CAPSULE | Freq: Four times a day (QID) | ORAL | Status: DC

## 2012-09-03 NOTE — Telephone Encounter (Signed)
Daughter/[name] is calling to say she say the urine cx was positive on my space and MD was going to call in antibiotics but she has been to the pharmacy and there are no orders there. RN saw order in EPIC [facility] [facility] Keflex 500mg  po BID x 5 days. #10. No refills.

## 2012-09-18 ENCOUNTER — Telehealth: Payer: Self-pay | Admitting: Internal Medicine

## 2012-09-18 NOTE — Telephone Encounter (Signed)
[facility] Triage Call Report Triage Record Num: [id number] Operator: Peri Jefferson Patient Name: [name] Call Date & Time: 09/18/2012 10:32:54AM Patient Phone: PCP: Neta Mends. Panosh Patient Gender: Female PCP Fax : [phone] Patient DOB: November 17, 1918 [facility] Name: [facility]  Reason for Call: Caller: Liam Graham; PCP: Berniece Andreas (Family Practice); CB#: [phone];Afebrile Call regarding Injury/Trauma; Patient fell on 09/13/12 due to loss of balance and hit front of leg a couple of inches above left front ankle with wound size 3 inch x 3 inch. Big toe is swollen and bruising to shin. All emergent symptoms per Abrasions, Lacerations, Puncture Wounds protocol ruled out with exception of "Friction and/or shear injury to skin, especially in an older adult or a debilitated patient". Home care advice given. Above triage completed by Levora Angel, RN on 09/17/12.  Protocol(s) Used: Office Note Recommended Outcome per Protocol: Information Noted and Sent to Office Reason for Outcome: Caller information to office

## 2012-09-19 ENCOUNTER — Telehealth: Payer: Self-pay | Admitting: Internal Medicine

## 2012-09-19 NOTE — Telephone Encounter (Signed)
[facility] Triage Call Report Triage Record Num: [id number] Operator: Peri Jefferson Patient Name: [name] Call Date & Time: 09/18/2012 10:32:54AM Patient Phone: PCP: Neta Mends. Panosh Patient Gender: Female PCP Fax : [phone] Patient DOB: Dec 22, 1918 [facility] Name: [facility] Reason for Call: Caller: Liam Graham; PCP: Berniece Andreas (Family Practice); CB#: [phone];Afebrile Call regarding Injury/Trauma; Patient fell on 09/13/12 due to loss of balance and hit front of leg a couple of inches above left front ankle with wound size 3 inch x 3 inch. Big toe is swollen and bruising to shin. All emergent symptoms per Abrasions, Lacerations, Puncture Wounds protocol ruled out with exception of "Friction and/or shear injury to skin, especially in an older adult or a debilitated patient". Home care advice given. Above triage completed by Levora Angel, RN on 09/17/12. Protocol(s) Used: Office Note Recommended Outcome per Protocol: Information Noted and Sent to Office Reason for Outcome: Caller information to office Care Advice: ~ 01/15/

## 2012-09-27 ENCOUNTER — Encounter: Payer: Self-pay | Admitting: Internal Medicine

## 2012-09-27 ENCOUNTER — Ambulatory Visit (INDEPENDENT_AMBULATORY_CARE_PROVIDER_SITE_OTHER): Payer: Medicare Other | Admitting: Internal Medicine

## 2012-09-27 VITALS — BP 124/72 | HR 76 | Temp 98.3°F | Wt 142.0 lb

## 2012-09-27 DIAGNOSIS — T07XXXA Unspecified multiple injuries, initial encounter: Secondary | ICD-10-CM

## 2012-09-27 DIAGNOSIS — L089 Local infection of the skin and subcutaneous tissue, unspecified: Secondary | ICD-10-CM

## 2012-09-27 DIAGNOSIS — T1490XA Injury, unspecified, initial encounter: Secondary | ICD-10-CM

## 2012-09-27 MED ORDER — DOXYCYCLINE HYCLATE 100 MG PO CAPS: 100.0000 mg | ORAL_CAPSULE | Freq: Two times a day (BID) | ORAL | Status: DC

## 2012-09-27 MED ORDER — CEPHALEXIN 500 MG PO CAPS: 500.0000 mg | ORAL_CAPSULE | Freq: Three times a day (TID) | ORAL | Status: DC

## 2012-09-27 NOTE — Progress Notes (Signed)
Chief Complaint  Patient presents with  . Leg wound    Happened 2-3 weeks ago per daughter.    HPI:   Patient comes in today for SDA for  new problem evaluation. About 10-14 days ago patient hit his shin against furniture area and had a large avulsion laceration superficial on the left shin. Local care was given keeping clean antibiotic ointment and covered. However there is a good bit of dead skin around it and it is getting smaller looking better but there is redness in the lower leg. No fever or falling currently. No active bleeding.  Comes in for evaluation about other care for wound   ROS: See pertinent positives and negatives per HPI. No fever  New uti sx  Chronic runny nose  No bleeding   Past Medical History  Diagnosis Date  . Anemia   . Hyperlipidemia   . Hypertension   . Seizure disorder     poss from hponatremia when had pneumonia  . Pneumonia 09/2002    2012  . Recurrent UTI   . Urinary incontinence     mild  . Osteoporosis     Grade II spondylotistesis at l4-l5 testing in [place] 2008 and confirmed that she has osteoporosis  . Spondylolisthesis of lumbar region     Grade II spondylotistesis at l4-l5 testing in [place] 2008   . Skin cancer 2012    leg   . Contusion of leg 08/06/2011  . Hematoma 08/06/2011    leg near shin no bov underlying bony abnormality and no infection. should take a while to resolve observe for infection     No family history on file.  History   Social History  . Marital Status: Widowed    Spouse Name: N/A    Number of Children: N/A  . Years of Education: N/A   Social History Main Topics  . Smoking status: Never Smoker   . Smokeless tobacco: None  . Alcohol Use: No  . Drug Use: No  . Sexually Active: None   Other Topics Concern  . None   Social History Narrative   Moved from [place] to daughter's [facility] of test conducted in 2002 and [place] to adult day care in day    Outpatient Encounter Prescriptions as of 09/27/2012    Medication Sig Dispense Refill  . amLODipine (NORVASC) 2.5 MG tablet Take 1 tablet (2.5 mg total) by mouth daily.  30 tablet  5  . Artificial Tear Solution (TEARS NATURALE OP) Apply to eye.      Marland Kitchen aspirin 81 MG tablet Take 81 mg by mouth daily. Every other day      . calcium-vitamin D (OSCAL WITH D 250-125) 250-125 MG-UNIT per tablet Take 1 tablet by mouth daily.        . cycloSPORINE (RESTASIS) 0.05 % ophthalmic emulsion 1 drop 2 (two) times daily.        Marland Kitchen donepezil (ARICEPT) 10 MG tablet Take 1 tablet (10 mg total) by mouth at bedtime.  90 tablet  3  . ibandronate (BONIVA) 150 MG tablet Take 150 mg by mouth every 30 (thirty) days. Take in the morning with a full glass of water, on an empty stomach, and do not take anything else by mouth or lie down for the next 30 min. - on the 1st Sunday of each month      . Multiple Vitamin (MULTIVITAMIN WITH MINERALS) TABS Take 1 tablet by mouth daily.      . predniSONE (DELTASONE)  10 MG tablet Take 4 mg by mouth daily. 10 mg daily for 6 weeks, 9 mg for 30 days (starting 7/21?) then decrease by 1 mg every 30 days thereafter      . cephALEXin (KEFLEX) 500 MG capsule Take 1 capsule (500 mg total) by mouth 3 (three) times daily.  21 capsule  0  . doxycycline (VIBRAMYCIN) 100 MG capsule Take 1 capsule (100 mg total) by mouth 2 (two) times daily.  14 capsule  0  . [DISCONTINUED] cephALEXin (KEFLEX) 500 MG capsule Take 1 capsule (500 mg total) by mouth 4 (four) times daily.  10 capsule  0    EXAM:  BP 124/72  Pulse 76  Temp 98.3 F (36.8 C) (Oral)  Wt 142 lb (64.411 kg)  There is no height on file to calculate BMI.  GENERAL: vitals reviewed and listed above, alert, oriented, appears well hydrated and in no acute distress drippy nose alert complains of some pain with manipulation walking with walker  Left lower extremity shows a 3-4 inch superficial abraded avulsion laceration with 2  Edges  with dark necrotic tissue superior and lateral.  There is  erythema down to the ankle but no swelling. No streaking no abscess.  After soaking with sterile saline age of superior skin removed as patient tolerated with sterile scissors. The other area was adhered to the wound and not removed. ASSESSMENT AND PLAN:  Discussed the following assessment and plan:  1. Infected laceration of skin  Ambulatory referral to General Surgery   May need further debridement we'll plan surgery to check when next week add antibiotics at this time  2. Injury  Ambulatory referral to General Surgery   can decrease the Keflex to twice a day if GI side effects.  Non stick covered and wrapped today.  -Patient advised to return or notify health care team  immediately if symptoms worsen or persist or new concerns arise.  Patient Instructions  Continue wound care add antibiotics as we discussed.  Plan see surgery .orto pedicsnext week to see if further debridement would be helpful.  steril saline to gently clean in the meantime as you are doing .   Neta Mends. Panosh M.D.  Total visit > 50% spent counseling and coordinating care  And above process

## 2012-09-27 NOTE — Patient Instructions (Addendum)
Continue wound care add antibiotics as we discussed.  Plan see surgery .orto pedicsnext week to see if further debridement would be helpful.  steril saline to gently clean in the meantime as you are doing .

## 2012-09-30 ENCOUNTER — Ambulatory Visit (INDEPENDENT_AMBULATORY_CARE_PROVIDER_SITE_OTHER): Payer: Medicare Other | Admitting: General Surgery

## 2012-09-30 ENCOUNTER — Encounter (INDEPENDENT_AMBULATORY_CARE_PROVIDER_SITE_OTHER): Payer: Self-pay | Admitting: General Surgery

## 2012-09-30 VITALS — BP 120/72 | HR 76 | Temp 98.6°F | Resp 20 | Ht <= 58 in | Wt 140.0 lb

## 2012-09-30 DIAGNOSIS — IMO0002 Reserved for concepts with insufficient information to code with codable children: Secondary | ICD-10-CM

## 2012-09-30 DIAGNOSIS — T148XXA Other injury of unspecified body region, initial encounter: Secondary | ICD-10-CM

## 2012-09-30 NOTE — Progress Notes (Signed)
Subjective:     Patient ID: [name], female   DOB: Aug 23, 1919, 79 y.o.   MRN: [id number]  HPI The patient is a 79 year old female who suffered a left lower extremity trauma after falling in the tub. Patient had an abrasion this area. Patient saw Dr. Fabian Sharp for this is secondary to concern for infection patient was placed on antibiotics. Her daughter was currently concerned about an area of nonviable tissue. The patient otherwise had no fevers or chills while at home. Continue with dressing changes while at home and washing the area with baths.  Review of Systems  Constitutional: Negative.   Eyes: Negative.   Respiratory: Negative.   Cardiovascular: Negative.   Gastrointestinal: Negative.   Neurological: Negative.        Objective:   Physical Exam  Constitutional: She is oriented to person, place, and time. She appears well-developed and well-nourished.  HENT:  Head: Normocephalic and atraumatic.  Eyes: Conjunctivae normal and EOM are normal. Pupils are equal, round, and reactive to light.  Neck: Normal range of motion. Neck supple.  Cardiovascular: Normal rate and normal heart sounds.   Pulmonary/Chest: Effort normal and breath sounds normal.  Abdominal: Soft. Bowel sounds are normal.  Musculoskeletal: Normal range of motion.  Neurological: She is alert and oriented to person, place, and time.  Skin:          Assessment:     79 year old female with a superficial left lower extremity abrasion.    Plan:     1. I agree with continued antibiotics at this time. 2. Not see any tissue to debris at this time. The area of tissue that is nonviable will eventually slough off over time. 3.  This can follow up with me when necessary should she have any issues with wound problems.

## 2012-10-08 ENCOUNTER — Ambulatory Visit (INDEPENDENT_AMBULATORY_CARE_PROVIDER_SITE_OTHER): Payer: Medicare Other | Admitting: General Surgery

## 2012-12-03 ENCOUNTER — Telehealth: Payer: Self-pay | Admitting: Family Medicine

## 2012-12-03 ENCOUNTER — Other Ambulatory Visit: Payer: Self-pay | Admitting: Family Medicine

## 2012-12-03 MED ORDER — AMLODIPINE BESYLATE 2.5 MG PO TABS: 2.5000 mg | ORAL_TABLET | Freq: Every day | ORAL | Status: DC

## 2012-12-03 NOTE — Telephone Encounter (Signed)
lmom for pt to sch appt °

## 2012-12-03 NOTE — Telephone Encounter (Signed)
I have not spoke to the pt.  Please schedule her to see [facility] for a bp check.  I sent in 90 days [facility]  She will need to be seen for further refills.  Thanks!!

## 2012-12-06 NOTE — Telephone Encounter (Signed)
Pt is sch for 4-17

## 2012-12-16 ENCOUNTER — Other Ambulatory Visit: Payer: Self-pay | Admitting: Family Medicine

## 2012-12-16 MED ORDER — IBANDRONATE SODIUM 150 MG PO TABS: 150.0000 mg | ORAL_TABLET | ORAL | Status: DC

## 2012-12-19 ENCOUNTER — Encounter: Payer: Self-pay | Admitting: Internal Medicine

## 2012-12-19 ENCOUNTER — Ambulatory Visit (INDEPENDENT_AMBULATORY_CARE_PROVIDER_SITE_OTHER): Payer: Medicare Other | Admitting: Internal Medicine

## 2012-12-19 VITALS — BP 144/84 | HR 80 | Temp 98.3°F | Wt 143.0 lb

## 2012-12-19 DIAGNOSIS — R32 Unspecified urinary incontinence: Secondary | ICD-10-CM | POA: Insufficient documentation

## 2012-12-19 DIAGNOSIS — E785 Hyperlipidemia, unspecified: Secondary | ICD-10-CM

## 2012-12-19 DIAGNOSIS — N390 Urinary tract infection, site not specified: Secondary | ICD-10-CM

## 2012-12-19 DIAGNOSIS — I10 Essential (primary) hypertension: Secondary | ICD-10-CM

## 2012-12-19 DIAGNOSIS — H04123 Dry eye syndrome of bilateral lacrimal glands: Secondary | ICD-10-CM

## 2012-12-19 DIAGNOSIS — R269 Unspecified abnormalities of gait and mobility: Secondary | ICD-10-CM

## 2012-12-19 DIAGNOSIS — M353 Polymyalgia rheumatica: Secondary | ICD-10-CM

## 2012-12-19 DIAGNOSIS — H04129 Dry eye syndrome of unspecified lacrimal gland: Secondary | ICD-10-CM

## 2012-12-19 DIAGNOSIS — M810 Age-related osteoporosis without current pathological fracture: Secondary | ICD-10-CM

## 2012-12-19 DIAGNOSIS — F068 Other specified mental disorders due to known physiological condition: Secondary | ICD-10-CM

## 2012-12-19 DIAGNOSIS — R2681 Unsteadiness on feet: Secondary | ICD-10-CM

## 2012-12-19 LAB — POCT URINALYSIS DIPSTICK
Bilirubin, UA: NEGATIVE
Glucose, UA: NEGATIVE
Spec Grav, UA: 1.015
Urobilinogen, UA: 0.2

## 2012-12-19 MED ORDER — IBANDRONATE SODIUM 150 MG PO TABS: 150.0000 mg | ORAL_TABLET | ORAL | Status: DC

## 2012-12-19 MED ORDER — METHYLCELLULOSE 1 % OP SOLN: 1.0000 [drp] | Freq: Four times a day (QID) | OPHTHALMIC | Status: DC

## 2012-12-19 NOTE — Patient Instructions (Signed)
No change in meds  Will let  you know about culture test. Otherwise due for  ;labs and evaluation in the fall [facility] December

## 2012-12-19 NOTE — Progress Notes (Signed)
Chief Complaint  Patient presents with  . Follow-up    multiple problems    HPI: Patient comes in today for follow up of  multiple medical problems.  With daughter    Weaning  Prednisone  1 mg  And then to go off.   For her pmr  Bp seems ok   Goes to adult day care and now memory is declining needs  rx to have them put in hyer eye drops  Always has burning eyes.   ? If urine.   Infection or now  Clear but increase  Accidens recently no falling       soimwhat dec balance prob from winter such. Nothing dramatic   Needs refill of bisphonsphonate no se noted ROS: See pertinent positives and negatives per HPI. No cough fever cp sob has hearing aid but doesn't wear it  Sometimes ears get clogged  Past Medical History  Diagnosis Date  . Anemia   . Hyperlipidemia   . Hypertension   . Seizure disorder     poss from hponatremia when had pneumonia  . Pneumonia 09/2002    2012  . Recurrent UTI   . Urinary incontinence     mild  . Osteoporosis     Grade II spondylotistesis at l4-l5 testing in [place] 2008 and confirmed that she has osteoporosis  . Spondylolisthesis of lumbar region     Grade II spondylotistesis at l4-l5 testing in [place] 2008   . Skin cancer 2012    leg   . Contusion of leg 08/06/2011  . Hematoma 08/06/2011    leg near shin no bov underlying bony abnormality and no infection. should take a while to resolve observe for infection     No family history on file.  History   Social History  . Marital Status: Widowed    Spouse Name: N/A    Number of Children: N/A  . Years of Education: N/A   Social History Main Topics  . Smoking status: Never Smoker   . Smokeless tobacco: None  . Alcohol Use: No  . Drug Use: No  . Sexually Active: None   Other Topics Concern  . None   Social History Narrative   Moved from [place] to daughter's home   [facility]   Memory test conducted in 2002 and 2007   G4P4   Goes to adult day care in day    Outpatient Encounter Prescriptions  as of 12/19/2012  Medication Sig Dispense Refill  . amLODipine (NORVASC) 2.5 MG tablet Take 1 tablet (2.5 mg total) by mouth daily.  90 tablet  0  . Artificial Tear Solution (TEARS NATURALE OP) Apply to eye.      Marland Kitchen aspirin 81 MG tablet Take 81 mg by mouth daily. Every other day      . calcium-vitamin D (OSCAL WITH D 250-125) 250-125 MG-UNIT per tablet Take 1 tablet by mouth daily.        . cycloSPORINE (RESTASIS) 0.05 % ophthalmic emulsion 1 drop 2 (two) times daily.        Marland Kitchen donepezil (ARICEPT) 10 MG tablet Take 1 tablet (10 mg total) by mouth at bedtime.  90 tablet  3  . ibandronate (BONIVA) 150 MG tablet Take 1 tablet (150 mg total) by mouth every 30 (thirty) days. Take in the morning with a full glass of water, on an empty stomach, and do not take anything else by mouth or lie down for the next 30 min. - on the  1st Sunday of each month  3 tablet  3  . Multiple Vitamin (MULTIVITAMIN WITH MINERALS) TABS Take 1 tablet by mouth daily.      . predniSONE (DELTASONE) 10 MG tablet Take 4 mg by mouth daily. 10 mg daily for 6 weeks, 9 mg for 30 days (starting 7/21?) then decrease by 1 mg every 30 days thereafter      . [DISCONTINUED] ibandronate (BONIVA) 150 MG tablet Take 1 tablet (150 mg total) by mouth every 30 (thirty) days. Take in the morning with a full glass of water, on an empty stomach, and do not take anything else by mouth or lie down for the next 30 min. - on the 1st Sunday of each month  3 tablet  0  . methylcellulose (ARTIFICIAL TEARS) 1 % ophthalmic solution Place 1 drop into both eyes 4 (four) times daily.  15 mL  12  . [DISCONTINUED] cephALEXin (KEFLEX) 500 MG capsule Take 1 capsule (500 mg total) by mouth 3 (three) times daily.  21 capsule  0  . [DISCONTINUED] doxycycline (VIBRAMYCIN) 100 MG capsule Take 1 capsule (100 mg total) by mouth 2 (two) times daily.  14 capsule  0   No facility-administered encounter medications on file as of 12/19/2012.    EXAM:  BP 144/84  Pulse 80   Temp(Src) 98.3 F (36.8 C) (Oral)  Wt 143 lb (64.864 kg)  BMI 29.89 kg/m2  SpO2 96%  Body mass index is 29.89 kg/(m^2).  GENERAL: vitals reviewed and listed above, alert,  appears well hydrated and in no acute distress  HEENT: atraumatic, conjunctiva   Weepy clear dc , no obvious abnormalities on inspection of external nose and ears OP : no lesion edema or exudate   NECK: no obvious masses on inspection palpation  No adenopathy   LUNGS: clear to auscultation bilaterally, no wheezes, rales or rhonchi, good air movement kyphosis   CV: HRRR, no clubbing cyanosis or  peripheral edema  Compression hose  nl cap refill   MS: moves all extremities without noticeable focal  abnormality  PSYCH: pleasant and cooperative,  wals with alker and is independent with this with spotting Lab Results  Component Value Date   WBC 11.7* 08/30/2012   HGB 13.2 08/30/2012   HCT 39.2 08/30/2012   PLT 236.0 08/30/2012   GLUCOSE 100* 08/30/2012   CHOL  Value: 101        ATP III CLASSIFICATION:  <200     mg/dL   Desirable  409-811  mg/dL   Borderline High  >=914    mg/dL   High 78/29/5621   TRIG 52 07/17/2008   HDL 47 07/17/2008   LDLCALC  Value: 44        Total Cholesterol/HDL:CHD Risk Coronary Heart Disease Risk Table                     Men   Women  1/2 Average Risk   3.4   3.3 07/17/2008   ALT 12 02/14/2012   AST 17 02/14/2012   NA 134* 08/30/2012   K 4.1 08/30/2012   CL 96 08/30/2012   CREATININE 0.8 08/30/2012   BUN 19 08/30/2012   CO2 29 08/30/2012   TSH 1.03 02/14/2012   INR 1.2 07/17/2008   HGBA1C  Value: 6.3 (NOTE)   The ADA recommends the following therapeutic goal for glycemic   control related to Hgb A1C measurement:   Goal of Therapy:   < 7.0% Hgb A1C  Reference: American Diabetes Association: Clinical Practice   Recommendations 2008, Diabetes Care,  2008, 31:(Suppl 1).* 07/17/2008    ASSESSMENT AND PLAN:  Discussed the following assessment and plan:  HYPERTENSION  Incontinence of  urine in female - incr ok process home specimenforua and cx hard to tell if significant - Plan: POCT urinalysis dipstick, Urine culture  UTI'S, RECURRENT  HYPERLIPIDEMIA  Gait instability  DEMENTIA, - progressing   Dry eye syndrome, bilateral  OSTEOPOROSIS - refill meds  PMR (polymyalgia rheumatica) - weaning meds   -Patient advised to return or notify health care team  if symptoms worsen or persist or new concerns arise.  Patient Instructions  No change in meds  Will let  you know about culture test. Otherwise due for  ;labs and evaluation in the fall NOV December      Chazlyn Cude K. Nevaen Tredway M.D.

## 2012-12-21 LAB — URINE CULTURE: Colony Count: >=100000

## 2012-12-22 NOTE — Progress Notes (Signed)
Quick Note:  Tell patient that urine culture shows multiple species of bacteria that may be contaminants and no predominent germ. If seems like uti contact us ______

## 2012-12-23 ENCOUNTER — Telehealth: Payer: Self-pay | Admitting: Family Medicine

## 2012-12-23 ENCOUNTER — Other Ambulatory Visit: Payer: Self-pay | Admitting: Family Medicine

## 2012-12-23 MED ORDER — CEFUROXIME AXETIL 500 MG PO TABS: 500.0000 mg | ORAL_TABLET | Freq: Two times a day (BID) | ORAL | Status: DC

## 2012-12-23 NOTE — Telephone Encounter (Signed)
Ceftin 500 mg 1 po bid for 5 days disp 10

## 2012-12-23 NOTE — Telephone Encounter (Signed)
[facility]  Pt's daughter notified.

## 2012-12-23 NOTE — Telephone Encounter (Signed)
I spoke to [name] and gave her the result of the urine culture.  She would like to go ahead and start antibiotic.  Pt has had frequent bladder leakage.  Especially at night.  Please advise.  Thanks!!

## 2012-12-31 ENCOUNTER — Encounter: Payer: Self-pay | Admitting: Internal Medicine

## 2012-12-31 ENCOUNTER — Telehealth: Payer: Self-pay | Admitting: Internal Medicine

## 2012-12-31 DIAGNOSIS — R05 Cough: Secondary | ICD-10-CM

## 2012-12-31 DIAGNOSIS — R079 Chest pain, unspecified: Secondary | ICD-10-CM

## 2012-12-31 NOTE — Telephone Encounter (Signed)
Disc with daughter   c xray ordered to be done in am.  Fu  And rx depending on results.,

## 2012-12-31 NOTE — Telephone Encounter (Signed)
Patient Information:  Caller Name: Windell Moulding  Phone: [phone]  Patient: [name], [name]  Gender: Female  DOB: Sep 07, 1918  Age: 79 Years  PCP: Berniece Andreas [facility])  Office Follow Up:  Does the office need to follow up with this patient?: Yes  Instructions For The Office: Follow up with patient's daughter regarding an chest x-ray.  Daughter has an appointment today with her mom's provider Dr. Fabian Sharp.  RN Note:  Patient is in adult day care that had Norovirus go through.  Onset of vomiting was 12/27/12 with 6 hours of vomiting and retching.  The vomiting and diarrhea has ended but mother is unable to take a deep breath.  Is complaining of right upper quadrant pain and pain in side at lower rib cage.  Daughter's husband is a pulmonologist who ausculted crackles in her chest and feels she may have a cracked rib.  Pain is rated as 8/10-pain treated with extra strength Tylenol.  No SOB or emergent symptoms assessed.  Patient is very weak from the stomach virus.  Triaged wit care advice given.  Disposition is to be seen , but daughter with the weather and her mom's weakness does not want to bring her out.  Is requesting a chest x-ray for evaluation.  Patient's daughter Windell Moulding has an appointment to see Dr. Fabian Sharp today and will further discuss her mother's care.  Symptoms  Reason For Call & Symptoms: Norovirus from Adult day care.  Weak, rib pain, back pain;  Reviewed Health History In EMR: Yes  Reviewed Medications In EMR: Yes  Reviewed Allergies In EMR: Yes  Reviewed Surgeries / Procedures: Yes  Date of Onset of Symptoms: 12/27/2012  Guideline(s) Used:  Chest Pain  Disposition Per Guideline:   See Today in Office  Reason For Disposition Reached:   All other patients with chest pain  Advice Given:  Call Back If:  You become worse.  Patient Refused Recommendation:  Patient Requests Prescription  Daughter is requesting order for a chest x-ray.

## 2013-01-01 ENCOUNTER — Ambulatory Visit (INDEPENDENT_AMBULATORY_CARE_PROVIDER_SITE_OTHER)
Admission: RE | Admit: 2013-01-01 | Discharge: 2013-01-01 | Disposition: A | Discharge status: Home / Self Care | Payer: Medicare Other | Source: Ambulatory Visit | Attending: Internal Medicine | Admitting: Internal Medicine

## 2013-01-01 DIAGNOSIS — R079 Chest pain, unspecified: Secondary | ICD-10-CM

## 2013-01-01 DIAGNOSIS — R05 Cough: Secondary | ICD-10-CM

## 2013-01-10 ENCOUNTER — Encounter: Payer: Self-pay | Admitting: Internal Medicine

## 2013-01-10 ENCOUNTER — Ambulatory Visit (INDEPENDENT_AMBULATORY_CARE_PROVIDER_SITE_OTHER)
Admission: RE | Admit: 2013-01-10 | Discharge: 2013-01-10 | Disposition: A | Discharge status: Home / Self Care | Payer: Medicare Other | Source: Ambulatory Visit | Attending: Internal Medicine | Admitting: Internal Medicine

## 2013-01-10 ENCOUNTER — Other Ambulatory Visit: Payer: Self-pay | Admitting: Family Medicine

## 2013-01-10 ENCOUNTER — Ambulatory Visit (INDEPENDENT_AMBULATORY_CARE_PROVIDER_SITE_OTHER): Payer: Medicare Other | Admitting: Internal Medicine

## 2013-01-10 VITALS — BP 134/74 | HR 79 | Temp 98.7°F

## 2013-01-10 DIAGNOSIS — R05 Cough: Secondary | ICD-10-CM

## 2013-01-10 DIAGNOSIS — R531 Weakness: Secondary | ICD-10-CM | POA: Insufficient documentation

## 2013-01-10 DIAGNOSIS — R079 Chest pain, unspecified: Secondary | ICD-10-CM | POA: Insufficient documentation

## 2013-01-10 DIAGNOSIS — M353 Polymyalgia rheumatica: Secondary | ICD-10-CM

## 2013-01-10 DIAGNOSIS — R0602 Shortness of breath: Secondary | ICD-10-CM

## 2013-01-10 DIAGNOSIS — R5383 Other fatigue: Secondary | ICD-10-CM

## 2013-01-10 LAB — SEDIMENTATION RATE: Sed Rate: 95 mm/hr — ABNORMAL HIGH (ref 0–22)

## 2013-01-10 LAB — CBC WITH DIFFERENTIAL/PLATELET
Basophils Relative: 0.1 % (ref 0.0–3.0)
Eosinophils Absolute: 0.3 10*3/uL (ref 0.0–0.7)
Hemoglobin: 12.6 g/dL (ref 12.0–15.0)
MCHC: 34.3 g/dL (ref 30.0–36.0)
MCV: 88.7 fl (ref 78.0–100.0)
Monocytes Absolute: 0.6 10*3/uL (ref 0.1–1.0)
Neutro Abs: 9.3 10*3/uL — ABNORMAL HIGH (ref 1.4–7.7)
RBC: 4.14 Mil/uL (ref 3.87–5.11)
RDW: 13.4 % (ref 11.5–14.6)

## 2013-01-10 LAB — BASIC METABOLIC PANEL
BUN: 16 mg/dL (ref 6–23)
Calcium: 8.8 mg/dL (ref 8.4–10.5)
Creatinine, Ser: 0.7 mg/dL (ref 0.4–1.2)
GFR: 86.01 mL/min (ref >60.00)

## 2013-01-10 LAB — HEPATIC FUNCTION PANEL
Albumin: 3.1 g/dL — ABNORMAL LOW (ref 3.5–5.2)
Total Protein: 7.4 g/dL (ref 6.0–8.3)

## 2013-01-10 LAB — BRAIN NATRIURETIC PEPTIDE: Pro B Natriuretic peptide (BNP): 157 pg/mL — ABNORMAL HIGH (ref 0.0–100.0)

## 2013-01-10 MED ORDER — FUROSEMIDE 20 MG PO TABS: 20.0000 mg | ORAL_TABLET | Freq: Every day | ORAL | Status: DC

## 2013-01-10 MED ORDER — POTASSIUM CHLORIDE ER 10 MEQ PO TBCR: 10.0000 meq | EXTENDED_RELEASE_TABLET | Freq: Two times a day (BID) | ORAL | Status: DC

## 2013-01-10 MED ORDER — PREDNISONE 5 MG PO TABS: 10.0000 mg | ORAL_TABLET | Freq: Every day | ORAL | Status: DC

## 2013-01-10 MED ORDER — FUROSEMIDE 20 MG PO TABS: ORAL_TABLET | ORAL | Status: DC

## 2013-01-10 MED ORDER — AMOXICILLIN-POT CLAVULANATE 500-125 MG PO TABS: 1.0000 | ORAL_TABLET | Freq: Three times a day (TID) | ORAL | Status: DC

## 2013-01-10 MED ORDER — ALBUTEROL SULFATE (5 MG/ML) 0.5% IN NEBU
2.5000 mg | INHALATION_SOLUTION | Freq: Once | RESPIRATORY_TRACT | Status: AC
  Administered 2013-01-10: 2.5 mg via RESPIRATORY_TRACT

## 2013-01-10 NOTE — Progress Notes (Addendum)
Chief Complaint  Patient presents with  . Rib Pain    Left side.  Has gotten worse over the last few days.  Making it difficult to walk and  has sob.  Has been taking Extra Strength Tylenol for the pain.  Marland Kitchen Shortness of Breath    HPI: Patient comes in with daughter today for worsening problem.  Since last week she still has a cough that is somewhat dry and minor but appears to have some shortness of breath increasing weakness back in the wheelchair without associated fever nausea vomiting or diarrhea. She's had to come home from daycare. Her appetite continues to be normal.  She still has pain in her chest mid to right as the day goes on it is better in the morning.  Since last week no falling. Uncertain if her symptoms are from pain or some new process.  Daughter notes that she has just weaned off of the prednisone about 6-8 weeks ago given for PMR.  Her initial presentation was general weakness wanting to stay in a wheelchair.  Memories about the same but she can't remember his the pattern of her pain  Daughter denies problems with the bisphosphonate swallowing. ROS: See pertinent positives and negatives per HPI.  Past Medical History  Diagnosis Date  . Anemia   . Hyperlipidemia   . Hypertension   . Seizure disorder     poss from hponatremia when had pneumonia  . Pneumonia 09/2002    2012  . Recurrent UTI   . Urinary incontinence     mild  . Osteoporosis     Grade II spondylotistesis at l4-l5 testing in [place] 2008 and confirmed that she has osteoporosis  . Spondylolisthesis of lumbar region     Grade II spondylotistesis at l4-l5 testing in [place] 2008   . Skin cancer 2012    leg   . Contusion of leg 08/06/2011  . Hematoma 08/06/2011    leg near shin no bov underlying bony abnormality and no infection. should take a while to resolve observe for infection     History reviewed. No pertinent family history.  History   Social History  . Marital Status: Widowed    Spouse  Name: N/A    Number of Children: N/A  . Years of Education: N/A   Social History Main Topics  . Smoking status: Never Smoker   . Smokeless tobacco: None  . Alcohol Use: No  . Drug Use: No  . Sexually Active: None   Other Topics Concern  . None   Social History Narrative   Moved from [place] to daughter's home   [facility] of [facility]   Memory test conducted in 2002 and 2007   G4P4   Goes to adult day care in day    Outpatient Encounter Prescriptions as of 01/10/2013  Medication Sig Dispense Refill  . amLODipine (NORVASC) 2.5 MG tablet Take 1 tablet (2.5 mg total) by mouth daily.  90 tablet  0  . Artificial Tear Solution (TEARS NATURALE OP) Apply to eye.      Marland Kitchen aspirin 81 MG tablet Take 81 mg by mouth daily. Every other day      . calcium-vitamin D (OSCAL WITH D 250-125) 250-125 MG-UNIT per tablet Take 1 tablet by mouth daily.        . cycloSPORINE (RESTASIS) 0.05 % ophthalmic emulsion 1 drop 2 (two) times daily.        Marland Kitchen donepezil (ARICEPT) 10 MG tablet Take 1 tablet (10 mg total)  by mouth at bedtime.  90 tablet  3  . ibandronate (BONIVA) 150 MG tablet Take 1 tablet (150 mg total) by mouth every 30 (thirty) days. Take in the morning with a full glass of water, on an empty stomach, and do not take anything else by mouth or lie down for the next 30 min. - on the 1st Sunday of each month  3 tablet  3  . methylcellulose (ARTIFICIAL TEARS) 1 % ophthalmic solution Place 1 drop into both eyes 4 (four) times daily.  15 mL  12  . Multiple Vitamin (MULTIVITAMIN WITH MINERALS) TABS Take 1 tablet by mouth daily.      . predniSONE (DELTASONE) 10 MG tablet Take 4 mg by mouth daily. 10 mg daily for 6 weeks, 9 mg for 30 days (starting 7/21?) then decrease by 1 mg every 30 days thereafter      . [DISCONTINUED] cefUROXime (CEFTIN) 500 MG tablet Take 1 tablet (500 mg total) by mouth 2 (two) times daily.  10 tablet  0   No facility-administered encounter medications on file as of 01/10/2013.    EXAM:  BP 134/74  Pulse  79  Temp(Src) 98.7 F (37.1 C) (Oral)  SpO2 96%  Body mass index is 0.00 kg/(m^2).  GENERAL: vitals reviewed and listed above, alert, oriented, appears well hydrated and in no acute distress in a wheelchair occasionally minor grunting dyspneic with a intermittent cough dry. Her speech is not breathless color is good no cyanosis or edema  HEENT: atraumatic, conjunctiva  clear, no obvious abnormalities on inspection of external nose and ears was chewing gum initially not interfering with her breathing NECK: no obvious masses on inspection palpation   LUNGS:  Severe kyphosis I think the air movement is adequate don't hear obvious rales or rhonchi   CV: HRRR, no gallops or murmurs no clubbing cyanosis or  peripheral edema nl cap refill   MS: moves all extremities without noticeable focal  Abnormality  in a wheelchair  PSYCH: pleasant and cooperative,   obvious memory difficulties Nebulizer with albuterol was given empirically uncertain if needed difference in her respiratory rate and comfort. Perhaps easier breathing chest sounds no different.  ASSESSMENT AND PLAN:  Discussed the following assessment and plan:  Cough - Plan: CBC with Differential, Basic metabolic panel, Brain natriuretic peptide, D-dimer, quantitative, DG Chest 2 View, Hepatic function panel, Sedimentation rate  Shortness of breath - Not hypoxic at rest   - Plan: CBC with Differential, Basic metabolic panel, Brain natriuretic peptide, D-dimer, quantitative, DG Chest 2 View, Hepatic function panel, Sedimentation rate  Chest pain - Presumed from chest wall pain as it began after her [place] virus vomiting - Plan: DG Chest 2 View, Hepatic function panel, Sedimentation rate  PMR (polymyalgia rheumatica) - Recently weaned off prednisone uncertain if weakness could be related as a relapse  Weakness generalized - Prefers to spend more time in a wheelchair less energy  -Patient advised to return or notify health care team  if  symptoms worsen or persist or new concerns arise.  Patient Instructions  Uncertain cause of worsening ? Infection   ? Pain  Plan labs and repeat chest x ray   To check for  Developing infection. Pulse ox is 92  After activity and 96 at rest.   Will notify you  of labs when available.      Neta Mends. Panosh M.D.  BNP 137  Chest x ray bilateral pulm effusions left more than right . Esr  95  Lab Results  Component Value Date   WBC 11.6* 01/10/2013   HGB 12.6 01/10/2013   HCT 36.8 01/10/2013   MCV 88.7 01/10/2013   PLT 339.0 01/10/2013     Chemistry      Component Value Date/Time   NA 137 01/10/2013 1216   K 3.4* 01/10/2013 1216   CL 102 01/10/2013 1216   CO2 25 01/10/2013 1216   BUN 16 01/10/2013 1216   CREATININE 0.7 01/10/2013 1216      Component Value Date/Time   CALCIUM 8.8 01/10/2013 1216   CALCIUM 9.1 09/03/2008 0000   ALKPHOS 65 01/10/2013 1216   AST 18 01/10/2013 1216   ALT 12 01/10/2013 1216   BILITOT 0.4 01/10/2013 1216    Discussed with daughter and Dr. Iven Finn cover for infection as she is an aspiration risk and her white count is a little elevated with Augmentin 500 twice a day Lasix diuresis over the weekend with potassium supplementation of note she had hyponatremia in the remote past on diuretics in hospital.  We'll also add a low dose prednisone because of her again elevated sedimentation rate consistent with her PMR. Or other inflammatory disease.  Plan repeat chest x-ray chemistries after the weekend and then make a plan   d-dimer is pending

## 2013-01-10 NOTE — Addendum Note (Signed)
Addended byBerniece Andreas K on: 01/10/2013 04:18 PM   Modules accepted: Orders

## 2013-01-10 NOTE — Addendum Note (Signed)
Addended by: Raj Janus T on: 01/10/2013 01:32 PM   Modules accepted: Orders

## 2013-01-10 NOTE — Patient Instructions (Addendum)
Uncertain cause of worsening ? Infection   ? Pain  Plan labs and repeat chest x ray   To check for  Developing infection. Pulse ox is 92  After activity and 96 at rest.   Will notify you  of labs when available.

## 2013-01-11 ENCOUNTER — Encounter (HOSPITAL_COMMUNITY): Payer: Self-pay | Admitting: Physical Medicine and Rehabilitation

## 2013-01-11 ENCOUNTER — Emergency Department (HOSPITAL_COMMUNITY): Payer: Medicare Other

## 2013-01-11 ENCOUNTER — Emergency Department (HOSPITAL_COMMUNITY)
Admission: EM | Admit: 2013-01-11 | Discharge: 2013-01-11 | Disposition: A | Discharge status: Home / Self Care | Payer: Medicare Other | Attending: Emergency Medicine | Admitting: Emergency Medicine

## 2013-01-11 DIAGNOSIS — Z862 Personal history of diseases of the blood and blood-forming organs and certain disorders involving the immune mechanism: Secondary | ICD-10-CM | POA: Insufficient documentation

## 2013-01-11 DIAGNOSIS — Z87440 Personal history of urinary (tract) infections: Secondary | ICD-10-CM | POA: Insufficient documentation

## 2013-01-11 DIAGNOSIS — J90 Pleural effusion, not elsewhere classified: Secondary | ICD-10-CM

## 2013-01-11 DIAGNOSIS — R071 Chest pain on breathing: Secondary | ICD-10-CM | POA: Insufficient documentation

## 2013-01-11 DIAGNOSIS — Z87448 Personal history of other diseases of urinary system: Secondary | ICD-10-CM | POA: Insufficient documentation

## 2013-01-11 DIAGNOSIS — Z7982 Long term (current) use of aspirin: Secondary | ICD-10-CM | POA: Insufficient documentation

## 2013-01-11 DIAGNOSIS — Z8739 Personal history of other diseases of the musculoskeletal system and connective tissue: Secondary | ICD-10-CM | POA: Insufficient documentation

## 2013-01-11 DIAGNOSIS — R000 Tachycardia, unspecified: Secondary | ICD-10-CM | POA: Insufficient documentation

## 2013-01-11 DIAGNOSIS — Z85828 Personal history of other malignant neoplasm of skin: Secondary | ICD-10-CM | POA: Insufficient documentation

## 2013-01-11 DIAGNOSIS — Z8701 Personal history of pneumonia (recurrent): Secondary | ICD-10-CM | POA: Insufficient documentation

## 2013-01-11 DIAGNOSIS — Z8669 Personal history of other diseases of the nervous system and sense organs: Secondary | ICD-10-CM | POA: Insufficient documentation

## 2013-01-11 DIAGNOSIS — R05 Cough: Secondary | ICD-10-CM | POA: Insufficient documentation

## 2013-01-11 DIAGNOSIS — Z593 Problems related to living in residential institution: Secondary | ICD-10-CM | POA: Insufficient documentation

## 2013-01-11 DIAGNOSIS — R059 Cough, unspecified: Secondary | ICD-10-CM | POA: Insufficient documentation

## 2013-01-11 DIAGNOSIS — F068 Other specified mental disorders due to known physiological condition: Secondary | ICD-10-CM | POA: Insufficient documentation

## 2013-01-11 DIAGNOSIS — Z789 Other specified health status: Secondary | ICD-10-CM | POA: Insufficient documentation

## 2013-01-11 DIAGNOSIS — Z79899 Other long term (current) drug therapy: Secondary | ICD-10-CM | POA: Insufficient documentation

## 2013-01-11 DIAGNOSIS — E785 Hyperlipidemia, unspecified: Secondary | ICD-10-CM | POA: Insufficient documentation

## 2013-01-11 DIAGNOSIS — I10 Essential (primary) hypertension: Secondary | ICD-10-CM | POA: Insufficient documentation

## 2013-01-11 DIAGNOSIS — Z87828 Personal history of other (healed) physical injury and trauma: Secondary | ICD-10-CM | POA: Insufficient documentation

## 2013-01-11 LAB — POCT I-STAT TROPONIN I: Troponin i, poc: 0 ng/mL (ref 0.00–0.08)

## 2013-01-11 MED ORDER — SODIUM CHLORIDE 0.9 % IV BOLUS (SEPSIS)
500.0000 mL | Freq: Once | INTRAVENOUS | Status: AC
  Administered 2013-01-11: 500 mL via INTRAVENOUS

## 2013-01-11 MED ORDER — IOHEXOL 350 MG/ML SOLN
100.0000 mL | Freq: Once | INTRAVENOUS | Status: AC | PRN
  Administered 2013-01-11: 100 mL via INTRAVENOUS

## 2013-01-11 NOTE — ED Notes (Signed)
Pt presents to department for evaluation of SOB and chest pain. Ongoing x1 week. Elevated WBC's and D-Dimer from PCP office. Pt continues to have difficulty breathing and chest pain. Respirations unlabored. Family states she is sedentary and is very weak. Pt is alert and can answer simple questions.

## 2013-01-11 NOTE — ED Notes (Signed)
Pt brought to ED for evaluation of elevated WBC and d-dimer per PCP office that was drawn yesterday.  Pt has had increased SOB for the past week.  Medication regimen was changed yesterday.  Family at bedside.  IV in place, pt on cardiac monitor, will continue to monitor pt.

## 2013-01-11 NOTE — ED Provider Notes (Signed)
History     CSN: [id number]  Arrival date & time 01/11/13  1830   First MD Initiated Contact with Patient 01/11/13 1910      Chief Complaint  Patient presents with  . Shortness of Breath  . Chest Pain    (Consider location/radiation/quality/duration/timing/severity/associated sxs/prior treatment) HPI This 79 year old female has poor short-term memory at baseline and lives in a care facility, the patient has a one-week history of coughing with some intermittent shortness of breath at times, patient is not short of breath now but has had some pleuritic left-sided chest pain off-and-on for the last several days at times as well, at baseline she is generally weak, she had chest x-ray yesterday which revealed bilateral pleural effusions of uncertain etiology and started Augmentin for possible aspiration pneumonia because about a week or so ago she had vomiting and diarrhea which lasted a couple of days, she had a BNP that was only 157 yesterday, she has a history of polymyalgia rheumatica and has been on steroids in the past and had a sedimentation rate of 95 yesterday, she is sent to the ED today for chest CT to rule out pulmonary embolism since her d-dimer yesterday was elevated at 3.77 resulted today. Treatment prior to arrival consists of Augmentin, Lasix, and steroids from her primary care Dr. started yesterday. There is no change in her baseline mental status, she might have had low-grade fever off-and-on the last several days but this is uncertain, she is not currently short of breath in the ED, she is no abdominal pain, she has not had vomiting or diarrhea in about a week, she has generalized weakness which is nonfocal. Past Medical History  Diagnosis Date  . Anemia   . Hyperlipidemia   . Hypertension   . Seizure disorder     poss from hponatremia when had pneumonia  . Pneumonia 09/2002    2012  . Recurrent UTI   . Urinary incontinence     mild  . Osteoporosis     Grade II  spondylotistesis at l4-l5 testing in [place] 2008 and confirmed that she has osteoporosis  . Spondylolisthesis of lumbar region     Grade II spondylotistesis at l4-l5 testing in [place] 2008   . Skin cancer 2012    leg   . Contusion of leg 08/06/2011  . Hematoma 08/06/2011    leg near shin no bov underlying bony abnormality and no infection. should take a while to resolve observe for infection    poor short term memory with dementia  Past Surgical History  Procedure Laterality Date  . Breast surgery      rt and lf, had fibrofatty tissue    No family history on file.  History  Substance Use Topics  . Smoking status: Never Smoker   . Smokeless tobacco: Not on file  . Alcohol Use: No    OB History   Grav Para Term Preterm Abortions TAB SAB Ect Mult Living   4 4              Review of Systems  Unable to perform ROS: Dementia    Allergies  Betimol; Codeine; Gabapentin; and Sulfonamide derivatives  Home Medications   Current Outpatient Rx  Name  Route  Sig  Dispense  Refill  . amLODipine (NORVASC) 2.5 MG tablet   Oral   Take 1 tablet (2.5 mg total) by mouth daily.   90 tablet   0   . amoxicillin-clavulanate (AUGMENTIN) 500-125 MG per tablet  Oral   Take 1 tablet (500 mg total) by mouth 3 (three) times daily.   21 tablet   0   . aspirin 81 MG tablet   Oral   Take 81 mg by mouth every other day.          . Calcium Carbonate-Vitamin D (CALCIUM + D PO)   Oral   Take by mouth 2 (two) times daily.         . cycloSPORINE (RESTASIS) 0.05 % ophthalmic emulsion   Both Eyes   Place 1 drop into both eyes 2 (two) times daily.          Marland Kitchen donepezil (ARICEPT) 10 MG tablet   Oral   Take 1 tablet (10 mg total) by mouth at bedtime.   90 tablet   3     Needs follow up before further refills   . furosemide (LASIX) 20 MG tablet   Oral   Take 20 mg by mouth daily. Take 1 po qd for 4 days or as directed         . ibandronate (BONIVA) 150 MG tablet   Oral   Take 1  tablet (150 mg total) by mouth every 30 (thirty) days. Take in the morning with a full glass of water, on an empty stomach, and do not take anything else by mouth or lie down for the next 30 min. - on the 1st Sunday of each month   3 tablet   3   . methylcellulose (ARTIFICIAL TEARS) 1 % ophthalmic solution   Both Eyes   Place 1 drop into both eyes 4 (four) times daily.         . Multiple Vitamin (MULTIVITAMIN WITH MINERALS) TABS   Oral   Take 1 tablet by mouth daily.         . potassium chloride (K-DUR) 10 MEQ tablet   Oral   Take 1 tablet (10 mEq total) by mouth 2 (two) times daily.   30 tablet   1   . predniSONE (DELTASONE) 5 MG tablet   Oral   Take 2 tablets (10 mg total) by mouth daily.   40 tablet   0   . Travoprost, BAK Free, (TRAVATAN) 0.004 % SOLN ophthalmic solution   Both Eyes   Place 1 drop into both eyes at bedtime.           BP 137/80  Pulse 96  Temp(Src) 100.5 F (38.1 C) (Oral)  Resp 17  SpO2 96%  Physical Exam  Nursing note and vitals reviewed. Constitutional:  Awake, alert, nontoxic appearance.  HENT:  Head: Atraumatic.  Eyes: Right eye exhibits no discharge. Left eye exhibits no discharge.  Neck: Neck supple.  Cardiovascular: Regular rhythm.   No murmur heard. Mildly tachycardic  Pulmonary/Chest: Effort normal. No respiratory distress. She has no wheezes. She has rales. She exhibits no tenderness.  Crackles at least halfway up on the left side with a right-sided posterior lung field showing basilar crackles with no wheezing retractions rhonchi or accessory muscle usage with pulse oximetry normal on room air 94%  Abdominal: Soft. There is no tenderness. There is no rebound.  Musculoskeletal: She exhibits no edema and no tenderness.  Baseline ROM, no obvious new focal weakness.  Neurological: She is alert.  Mental status and motor strength appears baseline for patient and situation.  Skin: No rash noted.  Psychiatric: She has a normal mood  and affect.    ED Course  Procedures (including critical care  time) ECG: Sinus tachycardia, ventricular rate 102, normal axis, left jugular hypertrophy, nonspecific T wave abnormality, no significant change noted compared with July 2013   Labs Reviewed  CG4 I-STAT (LACTIC ACID)  POCT I-STAT TROPONIN I   Dg Chest 2 View  01/10/2013  *RADIOLOGY REPORT*  Clinical Data: Cough. Shortness of breath.  CHEST - 2 VIEW  Comparison: 01/01/2013  Findings: Bilateral pleural effusions greater on the left. Adjacent atelectasis lung bases.  Limited for detecting underlying infiltrate or mass.  Mild pulmonary vascular prominence.  Cardiomegaly.  Calcified tortuous aorta.  Thoracic kyphosis with prior cement augmentation lower thoracic vertebra.  IMPRESSION: Bilateral pleural effusions greater on the left. Adjacent atelectasis lung bases.  Mild pulmonary vascular prominence.  Cardiomegaly.  Calcified tortuous aorta.   Original Report Authenticated By: Lacy Duverney, M.D.    Ct Angio Chest Pe W/cm &/or Wo Cm  01/11/2013  *RADIOLOGY REPORT*  Clinical Data: Chest pain and cough.  Elevated D-dimer.  CT ANGIOGRAPHY CHEST  Technique:  Multidetector CT imaging of the chest using the standard protocol during bolus administration of intravenous contrast. Multiplanar reconstructed images including MIPs were obtained and reviewed to evaluate the vascular anatomy.  Contrast: OMNIPAQUE IOHEXOL 350 MG/ML SOLN .  Comparison: Two-view chest x-ray 01/10/2013.  Findings: Pulmonary arterial opacification is excellent.  There are no focal filling defects to suggest pulmonary emboli.  The heart size is enlarged.  Extensive coronary artery calcifications are present.  Atherosclerotic calcifications are present over the arch and at the origins of the great vessels.  Bilateral pleural effusions are evident, left greater than right. Associated atelectasis is also worse on the left.  No significant mediastinal or axillary adenopathy is  present.  Degenerative changes are noted in the shoulders.  Limited imaging of the upper abdomen is unremarkable.  Lung windows are somewhat degraded by patient breathing motion. Apart from the dependent atelectasis, no other significant airspace disease is evident.  The bone windows demonstrate exaggerated kyphosis.  Spinal augmentation has been performed at T9, T10, and T11.  No acute fractures are evident.  IMPRESSION:  1.  No evidence for pulmonary embolus. 2.  Mild cardiomegaly. 3.  Extensive atherosclerotic changes including coronary artery disease. 4.  Moderate bilateral pleural effusions, left greater than right. 5.  Bibasilar airspace disease likely reflects atelectasis. Infection cannot be excluded. 6.  Exaggerated kyphosis with evidence for spinal augmentation at T9, T10, and T11.   Original Report Authenticated By: Marin Roberts, M.D.      1. Bilateral pleural effusion       MDM  Pt stable in ED with no significant deterioration in condition.Patient / Family / Caregiver informed of clinical course, understand medical decision-making process, and agree with plan.I doubt any other EMC precluding discharge at this time including, but not necessarily limited to the following:sepsis, ACS, HF.       Hurman Horn, MD 01/11/13 2216

## 2013-01-13 ENCOUNTER — Telehealth: Payer: Self-pay | Admitting: Internal Medicine

## 2013-01-13 NOTE — Telephone Encounter (Signed)
[facility] Triage Call Report Triage Record Num: [id number] Operator: Hyman Bower Patient Name: [name] Call Date & Time: 01/11/2013 5:55:53PM Patient Phone: PCP: Neta Mends. Panosh Patient Gender: Female PCP Fax : [phone] Patient DOB: [birth year] Practice Name: [facility] Reason for Call: Caller: Selena Batten; PCP: Berniece Andreas [facility]); CB#: [phone]; D Dimer collected 01/10/13 at 12:16pm, result: 3.77 (0.0-0.48) this level is an alert high, not critical. No previous labs available. Reviewed EPIC chart and Dr. Fabian Sharp has reviewed this lab result and spoken with patient's daughter regarding the result. Protocol(s) Used: Office Note Recommended Outcome per Protocol: Information Noted and Sent to Office Reason for Outcome: Caller information to office Care Advice: ~ 05/

## 2013-01-13 NOTE — Telephone Encounter (Signed)
PT daughter called to inquire about her upcoming chest xray and labs today. She stated that her mother had labs done on Friday and had ended up having a chest ct scan on Saturday.Therefor she wanted to know if Dr. Fabian Sharp would still like them to complete the chest xray and additional labs. Please assist.

## 2013-01-14 NOTE — Telephone Encounter (Signed)
Spoke with daughter yesterday the plan was to have her do the lab work and followup x-ray Wednesday morning the 14th and then plan followup

## 2013-01-15 ENCOUNTER — Ambulatory Visit (INDEPENDENT_AMBULATORY_CARE_PROVIDER_SITE_OTHER)
Admission: RE | Admit: 2013-01-15 | Discharge: 2013-01-15 | Disposition: A | Discharge status: Home / Self Care | Payer: Medicare Other | Source: Ambulatory Visit | Attending: Internal Medicine | Admitting: Internal Medicine

## 2013-01-15 ENCOUNTER — Telehealth: Payer: Self-pay | Admitting: Internal Medicine

## 2013-01-15 ENCOUNTER — Other Ambulatory Visit: Payer: Self-pay | Admitting: Internal Medicine

## 2013-01-15 ENCOUNTER — Other Ambulatory Visit: Payer: Medicare Other

## 2013-01-15 ENCOUNTER — Ambulatory Visit (HOSPITAL_COMMUNITY)
Admission: RE | Admit: 2013-01-15 | Discharge: 2013-01-15 | Disposition: A | Discharge status: Home / Self Care | Payer: Medicare Other | Source: Ambulatory Visit | Attending: Internal Medicine | Admitting: Internal Medicine

## 2013-01-15 ENCOUNTER — Other Ambulatory Visit (INDEPENDENT_AMBULATORY_CARE_PROVIDER_SITE_OTHER): Payer: Medicare Other

## 2013-01-15 DIAGNOSIS — M353 Polymyalgia rheumatica: Secondary | ICD-10-CM

## 2013-01-15 DIAGNOSIS — R5383 Other fatigue: Secondary | ICD-10-CM

## 2013-01-15 DIAGNOSIS — R05 Cough: Secondary | ICD-10-CM | POA: Insufficient documentation

## 2013-01-15 DIAGNOSIS — R0602 Shortness of breath: Secondary | ICD-10-CM

## 2013-01-15 DIAGNOSIS — R079 Chest pain, unspecified: Secondary | ICD-10-CM

## 2013-01-15 DIAGNOSIS — J90 Pleural effusion, not elsewhere classified: Secondary | ICD-10-CM

## 2013-01-15 DIAGNOSIS — R059 Cough, unspecified: Secondary | ICD-10-CM | POA: Insufficient documentation

## 2013-01-15 DIAGNOSIS — R531 Weakness: Secondary | ICD-10-CM

## 2013-01-15 LAB — BASIC METABOLIC PANEL
BUN: 19 mg/dL (ref 6–23)
CO2: 29 mEq/L (ref 19–32)
Calcium: 9 mg/dL (ref 8.4–10.5)
Chloride: 101 mEq/L (ref 96–112)
Creatinine, Ser: 0.9 mg/dL (ref 0.4–1.2)

## 2013-01-15 LAB — HEPATIC FUNCTION PANEL
ALT: 14 U/L (ref 0–35)
Albumin: 3.1 g/dL — ABNORMAL LOW (ref 3.5–5.2)
Alkaline Phosphatase: 61 U/L (ref 39–117)
Bilirubin, Direct: 0.1 mg/dL (ref 0.0–0.3)
Total Protein: 7.4 g/dL (ref 6.0–8.3)

## 2013-01-15 LAB — PROTIME-INR: INR: 1.2 ratio — ABNORMAL HIGH (ref 0.8–1.0)

## 2013-01-15 NOTE — Progress Notes (Signed)
Patient ID: [name], female   DOB: 08-27-1919, 79 y.o.   MRN: [id number]                                     Pt presented today for US guided left thoracentesis. On prelim [place] of left and right posterior chest regions there is only trace right pleural effusion and small left effusion. Dr. Maple Hudson notified of results and decision made to cancel thoracentesis based on risk vs benefit. Pt's family made aware of above.

## 2013-01-15 NOTE — Telephone Encounter (Signed)
Discussed w/ Dr Fabian Sharp this AM. Added liver panel and coags to labs ordered by Dr Fabian Sharp. Based on CXR still showing L>R pleural effusion after 5 days augmentin, prednisone and lasix, we are ordering ultrasound guided thoracentesis while transportation is available. Recognize this will likely be ambiguous, partly treated effusion.

## 2013-01-15 NOTE — Telephone Encounter (Signed)
Caryn Bee- PA at interventional radiology reports by ultrasound there is not enough free fluid to tap safely. With her kyphosis, what fluid there is tends to spread out so not as apparent as by supine CT. Not worth risk of pneumothorax, so thoracentesis was cancelled. Plan to complete current medication regimen.

## 2013-01-16 ENCOUNTER — Ambulatory Visit (HOSPITAL_COMMUNITY): Admission: RE | Admit: 2013-01-16 | Payer: Medicare Other | Source: Ambulatory Visit

## 2013-01-20 ENCOUNTER — Telehealth: Payer: Self-pay | Admitting: Internal Medicine

## 2013-01-20 NOTE — Telephone Encounter (Signed)
Pt had appt to draw fluid off her lungs at  Pulmonary md, but it was advised she not follow through and see PCP sometime this week.  Pt is continuing lasix and electrolytes .No appt except same day.  Do you need 30 min for this appt?

## 2013-01-20 NOTE — Telephone Encounter (Signed)
Ok to work her in 30 minutes   This week   How about 12 noon on Wednesday  May 21

## 2013-01-21 NOTE — Telephone Encounter (Signed)
appt set/kh 

## 2013-01-22 ENCOUNTER — Encounter: Payer: Self-pay | Admitting: Internal Medicine

## 2013-01-22 ENCOUNTER — Ambulatory Visit (INDEPENDENT_AMBULATORY_CARE_PROVIDER_SITE_OTHER): Payer: Medicare Other | Admitting: Internal Medicine

## 2013-01-22 VITALS — BP 128/66 | HR 78 | Temp 98.3°F

## 2013-01-22 DIAGNOSIS — M353 Polymyalgia rheumatica: Secondary | ICD-10-CM

## 2013-01-22 DIAGNOSIS — R0602 Shortness of breath: Secondary | ICD-10-CM

## 2013-01-22 DIAGNOSIS — J90 Pleural effusion, not elsewhere classified: Secondary | ICD-10-CM

## 2013-01-22 NOTE — Progress Notes (Signed)
Chief Complaint  Patient presents with  . Follow-up    HPI: Fu of cough sob pleural effusion elevated esr . Here with daughter. Since the last 10 days Had chest ct r/o PE because of elevated d-dimer no clot but seen has atherosclerosis on vessels   Effusion tha tdid not layer   and therefore was unable to do adequate thoracentesis.  Empiric treatment was given with Lasix and potassium stopped 2 days ago antibiotic Augmentin recently finished and prednisone 10 mg a day.  Since that time she's had more energy he is eating well was able to go back to her adult daycare still has shortness of breath and occasional cough but is a lot better than she was no increased leg swelling new syncope new symptoms.  ROS: See pertinent positives and negatives per HPI. No bleeding  Past Medical History  Diagnosis Date  . Anemia   . Hyperlipidemia   . Hypertension   . Seizure disorder     poss from hponatremia when had pneumonia  . Pneumonia 09/2002    2012  . Recurrent UTI   . Urinary incontinence     mild  . Osteoporosis     Grade II spondylotistesis at l4-l5 testing in [place] 2008 and confirmed that she has osteoporosis  . Spondylolisthesis of lumbar region     Grade II spondylotistesis at l4-l5 testing in [place] 2008   . Skin cancer 2012    leg   . Contusion of leg 08/06/2011  . Hematoma 08/06/2011    leg near shin no bov underlying bony abnormality and no infection. should take a while to resolve observe for infection     History reviewed. No pertinent family history.  History   Social History  . Marital Status: Widowed    Spouse Name: N/A    Number of Children: N/A  . Years of Education: N/A   Social History Main Topics  . Smoking status: Never Smoker   . Smokeless tobacco: None  . Alcohol Use: No  . Drug Use: No  . Sexually Active: None   Other Topics Concern  . None   Social History Narrative   Moved from [place] to daughter's home   [facility]   Memory test conducted in 2002 and  2007   G4P4   Goes to adult day care in day    Outpatient Encounter Prescriptions as of 01/22/2013  Medication Sig Dispense Refill  . amLODipine (NORVASC) 2.5 MG tablet Take 1 tablet (2.5 mg total) by mouth daily.  90 tablet  0  . amoxicillin-clavulanate (AUGMENTIN) 500-125 MG per tablet Take 1 tablet (500 mg total) by mouth 3 (three) times daily.  21 tablet  0  . aspirin 81 MG tablet Take 81 mg by mouth every other day.       . Calcium Carbonate-Vitamin D (CALCIUM + D PO) Take by mouth 2 (two) times daily.      . cycloSPORINE (RESTASIS) 0.05 % ophthalmic emulsion Place 1 drop into both eyes 2 (two) times daily.       Marland Kitchen donepezil (ARICEPT) 10 MG tablet Take 1 tablet (10 mg total) by mouth at bedtime.  90 tablet  3  . ibandronate (BONIVA) 150 MG tablet Take 1 tablet (150 mg total) by mouth every 30 (thirty) days. Take in the morning with a full glass of water, on an empty stomach, and do not take anything else by mouth or lie down for the next 30 min. - on the  1st Sunday of each month  3 tablet  3  . methylcellulose (ARTIFICIAL TEARS) 1 % ophthalmic solution Place 1 drop into both eyes 4 (four) times daily.      . Multiple Vitamin (MULTIVITAMIN WITH MINERALS) TABS Take 1 tablet by mouth daily.      . predniSONE (DELTASONE) 5 MG tablet Take 2 tablets (10 mg total) by mouth daily.  40 tablet  0  . Travoprost, BAK Free, (TRAVATAN) 0.004 % SOLN ophthalmic solution Place 1 drop into both eyes at bedtime.      . [DISCONTINUED] furosemide (LASIX) 20 MG tablet Take 20 mg by mouth daily. Take 1 po qd for 4 days or as directed      . [DISCONTINUED] potassium chloride (K-DUR) 10 MEQ tablet Take 1 tablet (10 mEq total) by mouth 2 (two) times daily.  30 tablet  1   No facility-administered encounter medications on file as of 01/22/2013.    EXAM:  BP 128/66  Pulse 78  Temp(Src) 98.3 F (36.8 C) (Oral)  SpO2 98%  Body mass index is 0.00 kg/(m^2).  GENERAL: vitals reviewed and listed above, alert,  oriented, appears well hydrated and in no acute distress still has some occasional grunting respiration color looks good more alert physically energetic today she does have a runny nose and weepy eyes as usual  NECK: no obvious masses on inspection palpation  I don't see JVD has some kyphosis of her back  LUNGS: clear to auscultation bilaterally, no wheezes, rales or rhonchi,  cannot tell effusion size. Perhaps decrease breath sounds at left base  CV: HRRR, no gallops or rubs are heard no clubbing cyanosis or  peripheral edema nl cap refill   MS: moves all extremities without noticeable focal  abnormality has arthritic changes obvious walks with a walker  PSYCH: , no obvious depression or anxiety alert pleasant and cooperative. Lab Results  Component Value Date   WBC 11.6* 01/10/2013   HGB 12.6 01/10/2013   HCT 36.8 01/10/2013   PLT 339.0 01/10/2013   GLUCOSE 102* 01/15/2013   CHOL  Value: 101        ATP III CLASSIFICATION:  <200     mg/dL   Desirable  161-096  mg/dL   Borderline High  >=045    mg/dL   High 40/98/1191   TRIG 52 07/17/2008   HDL 47 07/17/2008   LDLCALC  Value: 44        Total Cholesterol/HDL:CHD Risk Coronary Heart Disease Risk Table                     Men   Women  1/2 Average Risk   3.4   3.3 07/17/2008   ALT 14 01/15/2013   AST 17 01/15/2013   NA 137 01/15/2013   K 3.7 01/15/2013   CL 101 01/15/2013   CREATININE 0.9 01/15/2013   BUN 19 01/15/2013   CO2 29 01/15/2013   TSH 1.03 02/14/2012   INR 1.2* 01/15/2013   HGBA1C  Value: 6.3 (NOTE)   The ADA recommends the following therapeutic goal for glycemic   control related to Hgb A1C measurement:   Goal of Therapy:   < 7.0% Hgb A1C   Reference: American Diabetes Association: Clinical Practice   Recommendations 2008, Diabetes Care,  2008, 31:(Suppl 1).* 07/17/2008    ASSESSMENT AND PLAN:  Discussed the following assessment and plan:  Pleural effusion  Shortness of breath  PMR (polymyalgia rheumatica) Uncertain cause of the  bilateral pleural  effusions. She does have ascvd sclerotic changes in her vessels slightly enlarged cardiac silhouette and slightly elevated BNP. With a high sedimentation rate of 95 and an elevated d-dimer. Negative chest CT for clots negative troponin on evaluation.  She was treated for infection inflammation and fluid overload with Augmentin prednisone and Lasix potassium.  Currently he has stopped this except for the prednisone 10 mg.  She is clinically much improved although not back to baseline. I suspect she has a continued significant effusion.   She had many of the symptoms like moaning when she had PMR some of those are improved on the 10 of prednisone would like Dr. Dareen Piano to evaluate again advice about the prednisone treatment.  We'll plan repeat chest x-ray in the next week or so. Center restarting the Lasix.  Then plan followup in the next one to 3 weeks.  Daughter is having knee surgery June 16 she is the primary caretaker   -Patient advised to return or notify health care team  if symptoms worsen or persist or new concerns arise.  Patient Instructions  Continue  On the prednisone  10 mg for now  Get chest x ray  In next week  . Get dr Dareen Piano to see her again.   At this time will leave her off the lasix .  Plan follow up after x ray .  Back    Neta Mends. Kalvin Buss M.D.

## 2013-01-22 NOTE — Patient Instructions (Addendum)
Continue  On the prednisone  10 mg for now  Get chest x ray  In next week  . Get dr Dareen Piano to see her again.   At this time will leave her off the lasix .  Plan follow up after x ray .  Back

## 2013-01-30 ENCOUNTER — Other Ambulatory Visit: Payer: Self-pay | Admitting: *Deleted

## 2013-01-30 ENCOUNTER — Ambulatory Visit (INDEPENDENT_AMBULATORY_CARE_PROVIDER_SITE_OTHER)
Admission: RE | Admit: 2013-01-30 | Discharge: 2013-01-30 | Disposition: A | Discharge status: Home / Self Care | Payer: Medicare Other | Source: Ambulatory Visit | Attending: Internal Medicine | Admitting: Internal Medicine

## 2013-01-30 DIAGNOSIS — J90 Pleural effusion, not elsewhere classified: Secondary | ICD-10-CM

## 2013-02-07 ENCOUNTER — Other Ambulatory Visit: Payer: Self-pay | Admitting: Internal Medicine

## 2013-02-11 NOTE — Telephone Encounter (Signed)
Ok to refill x 1 . Please have jenny update  Her status  And pred dose.  We should be weaning dose or have dr  Dareen Piano  Decide on dosing .

## 2013-02-13 ENCOUNTER — Other Ambulatory Visit: Payer: Self-pay | Admitting: Family Medicine

## 2013-02-13 MED ORDER — PREDNISONE 5 MG PO TABS: 5.0000 mg | ORAL_TABLET | Freq: Every day | ORAL | Status: DC

## 2013-03-05 ENCOUNTER — Other Ambulatory Visit: Payer: Medicare Other

## 2013-03-05 ENCOUNTER — Other Ambulatory Visit: Payer: Self-pay | Admitting: Family Medicine

## 2013-03-05 ENCOUNTER — Encounter: Payer: Self-pay | Admitting: Internal Medicine

## 2013-03-05 DIAGNOSIS — R350 Frequency of micturition: Secondary | ICD-10-CM

## 2013-03-05 DIAGNOSIS — R300 Dysuria: Secondary | ICD-10-CM

## 2013-03-08 LAB — URINE CULTURE

## 2013-03-10 ENCOUNTER — Other Ambulatory Visit: Payer: Self-pay | Admitting: Family Medicine

## 2013-03-10 ENCOUNTER — Telehealth: Payer: Self-pay | Admitting: Family Medicine

## 2013-03-10 ENCOUNTER — Telehealth: Payer: Self-pay | Admitting: Internal Medicine

## 2013-03-10 MED ORDER — CEPHALEXIN 500 MG PO CAPS: 500.0000 mg | ORAL_CAPSULE | Freq: Three times a day (TID) | ORAL | Status: DC

## 2013-03-10 NOTE — Telephone Encounter (Signed)
[name] (daughter) notified by telephone.

## 2013-03-10 NOTE — Telephone Encounter (Signed)
If still havoing sx then can send in the keflex.

## 2013-03-10 NOTE — Telephone Encounter (Signed)
[facility] Triage Call Report Triage Record Num: [id number] Operator: Andreas Ohm Patient Name: [name] Name Call Date & Time: 03/07/2013 10:01:57AM Patient Phone: PCP: Neta Mends. Panosh Patient Gender: Female PCP Fax : [phone] Patient DOB: [birth year] [facility] Name: [facility] Reason for Call: Caller: Dianah Field; PCP: Berniece Andreas (Family Practice); CB#: [phone]; Call regarding Urinary Pain; Caller states pt is growing E.Coli in urine and needs medication. Per chart no reults released or signed by MD. When RN advised caller results weren't release and I was not authorized to to release info. He stated he was a physician and looked at pt's chart to see the preliminary results. Calledl MD on call and advised to call in Cipro 250mg  2 x day for 3 days. VORB. Phoned Rx to [facility] [address], [phone] to [facility]. Triage declined. Caller did veriy pt experiencing dysuria. No hematuria and afebrile. Protocol(s) Used: Office Note Recommended Outcome per Protocol: Information Noted and Sent to Office Reason for Outcome: Caller information to office Care Advice: ~ 07/

## 2013-03-10 NOTE — Telephone Encounter (Signed)
Spoke to [name].  Her husband gave [name] a prescription for cipro 250mg  1 po bid #6.  She finished this yesterday.  Do you want to do something different?  Still send in the same amount?

## 2013-03-17 ENCOUNTER — Encounter: Payer: Self-pay | Admitting: Internal Medicine

## 2013-03-19 ENCOUNTER — Other Ambulatory Visit: Payer: Self-pay | Admitting: Family Medicine

## 2013-03-19 MED ORDER — CEPHALEXIN 500 MG PO CAPS: 500.0000 mg | ORAL_CAPSULE | Freq: Three times a day (TID) | ORAL | Status: DC

## 2013-03-19 NOTE — Telephone Encounter (Signed)
[name] notified rx sent to the pharmacy.  See note Please refill the keflex For [name] and tell jenny we did this    Thanks    ----- Message -----   From: [name]   Sent: 03/19/2013 8:17 AM   To: [name], MD   Subject: FW: Non-Urgent Medical Question             ----- Message -----   From: [name]   Sent: 03/18/2013 8:00 PM   To: [facility] Clinical Pool   Subject: RE: Non-Urgent Medical Question       Can we refill Rx ? I am only 3 wks post TKR and can't drive or get around very well yet.       Thanks,    [name]      ----- Message -----   From: [name], MD   Sent: 03/18/2013 7:35 PM EDT   To: [name]   Subject: RE: Non-Urgent Medical Question      We could refill antibiotic medicine x 1 or retest her urine    Let me know what You prefer.   WP      ----- Message -----   From: [name]   Sent: 03/17/2013 9:51 AM EDT   To: [name], MD   Subject: Non-Urgent Medical Question      Mom has finished the antibiotic for UTI .I can tell she is better - no complaining of burning when she voids. However, she is still having accidents during the night. ( I find about 3 pair of damp/wet underwear in bathroom each morning ; this is down from 5 pair )   What next?       [name]   [phone]

## 2013-04-07 ENCOUNTER — Encounter: Payer: Self-pay | Admitting: Internal Medicine

## 2013-04-07 ENCOUNTER — Ambulatory Visit (INDEPENDENT_AMBULATORY_CARE_PROVIDER_SITE_OTHER): Payer: Medicare Other | Admitting: Internal Medicine

## 2013-04-07 VITALS — BP 140/72 | HR 81 | Temp 98.4°F | Wt 138.0 lb

## 2013-04-07 DIAGNOSIS — M353 Polymyalgia rheumatica: Secondary | ICD-10-CM

## 2013-04-07 DIAGNOSIS — Z79899 Other long term (current) drug therapy: Secondary | ICD-10-CM

## 2013-04-07 DIAGNOSIS — N390 Urinary tract infection, site not specified: Secondary | ICD-10-CM

## 2013-04-07 DIAGNOSIS — R32 Unspecified urinary incontinence: Secondary | ICD-10-CM

## 2013-04-07 LAB — POCT URINALYSIS DIPSTICK
Bilirubin, UA: NEGATIVE
Ketones, UA: NEGATIVE
Leukocytes, UA: NEGATIVE
Nitrite, UA: NEGATIVE
Protein, UA: NEGATIVE

## 2013-04-07 MED ORDER — FESOTERODINE FUMARATE ER 4 MG PO TB24: 4.0000 mg | ORAL_TABLET | Freq: Every day | ORAL | Status: DC

## 2013-04-07 NOTE — Progress Notes (Signed)
Chief Complaint  Patient presents with  . Urinary Incontinence    HPI: Patient comes in with daughter today for the above problem she was recently treated last month for UTI it was Escherichia coli with Keflex. Her symptoms improved but continued to deteriorate. She has gross incontinence the middle the night in the floors often wet daughter's worried about her slipping on it. She wears depends but doesn't always put him in the middle the night. Currently no hematuria. S to try some medications for this to see if that would be helpful she has seen urologists there may past no obvious history of obstruction. Daughter is aware of side effects of medications that can occur  Also she never got to the rheumatologist after we placed her back on prednisone during her acute event with possible pneumonia pleural effusion elevated sed rate and fatigue. She is down to 2 mg a day prednisone. Considering weaning to 1 mg.  ROS: See pertinent positives and negatives per HPI.  Past Medical History  Diagnosis Date  . Anemia   . Hyperlipidemia   . Hypertension   . Seizure disorder     poss from hponatremia when had pneumonia  . Pneumonia 09/2002    2012  . Recurrent UTI   . Urinary incontinence     mild  . Osteoporosis     Grade II spondylotistesis at l4-l5 testing in [place] 2008 and confirmed that she has osteoporosis  . Spondylolisthesis of lumbar region     Grade II spondylotistesis at l4-l5 testing in [place] 2008   . Skin cancer 2012    leg   . Contusion of leg 08/06/2011  . Hematoma 08/06/2011    leg near shin no bov underlying bony abnormality and no infection. should take a while to resolve observe for infection     Family History  Problem Relation Age of Onset  . Mental retardation Daughter   . Hearing loss Daughter     History   Social History  . Marital Status: Widowed    Spouse Name: N/A    Number of Children: N/A  . Years of Education: N/A   Social History Main Topics  .  Smoking status: Never Smoker   . Smokeless tobacco: None  . Alcohol Use: No  . Drug Use: No  . Sexually Active: None   Other Topics Concern  . None   Social History Narrative   Moved from [place] to daughter's home   [facility] of [facility]   Memory test conducted in 2002 and 2007   G4P4   Goes to adult day care in day    Outpatient Encounter Prescriptions as of 04/07/2013  Medication Sig Dispense Refill  . amLODipine (NORVASC) 2.5 MG tablet Take 1 tablet (2.5 mg total) by mouth daily.  90 tablet  0  . aspirin 81 MG tablet Take 81 mg by mouth every other day.       . Calcium Carbonate-Vitamin D (CALCIUM + D PO) Take by mouth 2 (two) times daily.      . cycloSPORINE (RESTASIS) 0.05 % ophthalmic emulsion Place 1 drop into both eyes 2 (two) times daily.       Marland Kitchen donepezil (ARICEPT) 10 MG tablet Take 1 tablet (10 mg total) by mouth at bedtime.  90 tablet  3  . methylcellulose (ARTIFICIAL TEARS) 1 % ophthalmic solution Place 1 drop into both eyes 4 (four) times daily.      . Multiple Vitamin (MULTIVITAMIN WITH MINERALS) TABS Take 1 tablet  by mouth daily.      . predniSONE (DELTASONE) 1 MG tablet Take 2 mg by mouth daily.      . Travoprost, BAK Free, (TRAVATAN) 0.004 % SOLN ophthalmic solution Place 1 drop into both eyes at bedtime.      . fesoterodine (TOVIAZ) 4 MG TB24 tablet Take 1 tablet (4 mg total) by mouth daily.  21 tablet  0  . ibandronate (BONIVA) 150 MG tablet Take 1 tablet (150 mg total) by mouth every 30 (thirty) days. Take in the morning with a full glass of water, on an empty stomach, and do not take anything else by mouth or lie down for the next 30 min. - on the 1st Sunday of each month  3 tablet  3  . [DISCONTINUED] amoxicillin-clavulanate (AUGMENTIN) 500-125 MG per tablet Take 1 tablet (500 mg total) by mouth 3 (three) times daily.  21 tablet  0  . [DISCONTINUED] cephALEXin (KEFLEX) 500 MG capsule Take 1 capsule (500 mg total) by mouth 3 (three) times daily.  15 capsule  0  . [DISCONTINUED]  predniSONE (DELTASONE) 5 MG tablet Take 1 tablet (5 mg total) by mouth daily.  30 tablet  1   No facility-administered encounter medications on file as of 04/07/2013.    EXAM:  BP 140/72  Pulse 81  Temp(Src) 98.4 F (36.9 C) (Oral)  Wt 138 lb (62.596 kg)  BMI 28.85 kg/m2  SpO2 96%  Body mass index is 28.85 kg/(m^2).  GENERAL: vitals reviewed and listed above, alert, cooperative appears well hydrated and in no acute distress mildly verbal. Pleasant dementia. Walks with a walker  HEENT: atraumatic, conjunctiva  clear, no obvious abnormalities on inspection of external nose and ears nose drips as normal NECK: no obvious masses on inspection palpation  MS: moves all extremities without noticeable focal  abnormality DJD changes  UA looks fairly clear rule out infection ASSESSMENT AND PLAN:  Discussed the following assessment and plan:  Urinary incontinence - Plan: POC Urinalysis Dipstick, Urine culture  UTI'S, RECURRENT - Plan: Urine culture  URINARY INCONTINENCE - Plan: Urine culture  PMR (polymyalgia rheumatica)  Medication management Irritable bladder medications have side effects that can be problematic such as dry mouth constipation and orthostatic hypotension. Daughter is aware medical family can try low dose to be as with caution stop it if not helping her get significant side effects. All of these medicines have an anticholinergic properties. Benefit of the trials greater than the risk and she's risk of falling on a wet floor at this time.  History of PMR reasonable to decrease to 1 mg after a few weeks. We have taken her off her Boniva during the episode of chest pain shortness of breath weight loss. After starting new medicine is reasonable to go back on Boniva when she is stable. -Patient advised to return or notify health care team  if symptoms worsen or persist or new concerns arise.  Patient Instructions  Can try   Medication but    Could cause side effects    Dry  mouth etc.  Can try if no sx of obstuction  Culture  And let you  know if positive for infection.   Consider in put form urology if needed   Decrease the prednisone  Slowly   After a few weeks then can go to 1 mg    Urinary Incontinence Your doctor wants you to have this information about urinary incontinence. This is the inability to keep urine in your body  until you decide to release it. CAUSES  Prostate gland enlargement is a common cause of urinary incontinence. But there are many different causes for losing urinary control. They include:  Medicines.  Infections.  Prostate problems.  Surgery.  Neurological diseases.  Emotional factors. DIAGNOSIS  Evaluating the cause of incontinence is important in choosing the best treatment. This may require:  An ultrasound exam.  Kidney and bladder X-rays.  Cystoscopy. This is an exam of the bladder using a narrow scope. TREATMENT  For incontinent patients, normal daily hygiene and using changing pads or adult diapers regularly will prevent offensive odors and skin damage from the moisture. Changing your medicines may help control incontinence. Your caregiver may prescribe some medicines to help you regain control. Avoid caffeine. It can over-stimulate the bladder. Use the bathroom regularly. Try about every 2 to 3 hours even if you do not feel the need. Take time to empty your bladder completely. After urinating, wait a minute. Then try to urinate again. External devices used to catch urine or an indwelling urine catheter (Foley catheter) may be needed as well. Some prostate gland problems require surgery to correct. Call your caregiver for more information. Document Released: 09/28/2004 Document Revised: 11/13/2011 Document Reviewed: 09/23/2008 [facility] Patient Information 2014 [place], [place].    Neta Mends. Quantavis Obryant M.D.

## 2013-04-07 NOTE — Patient Instructions (Addendum)
Can try   Medication but    Could cause side effects    Dry mouth etc.  Can try if no sx of obstuction  Culture  And let you  know if positive for infection.   Consider in put form urology if needed   Decrease the prednisone  Slowly   After a few weeks then can go to 1 mg    Urinary Incontinence Your doctor wants you to have this information about urinary incontinence. This is the inability to keep urine in your body until you decide to release it. CAUSES  Prostate gland enlargement is a common cause of urinary incontinence. But there are many different causes for losing urinary control. They include:  Medicines.  Infections.  Prostate problems.  Surgery.  Neurological diseases.  Emotional factors. DIAGNOSIS  Evaluating the cause of incontinence is important in choosing the best treatment. This may require:  An ultrasound exam.  Kidney and bladder X-rays.  Cystoscopy. This is an exam of the bladder using a narrow scope. TREATMENT  For incontinent patients, normal daily hygiene and using changing pads or adult diapers regularly will prevent offensive odors and skin damage from the moisture. Changing your medicines may help control incontinence. Your caregiver may prescribe some medicines to help you regain control. Avoid caffeine. It can over-stimulate the bladder. Use the bathroom regularly. Try about every 2 to 3 hours even if you do not feel the need. Take time to empty your bladder completely. After urinating, wait a minute. Then try to urinate again. External devices used to catch urine or an indwelling urine catheter (Foley catheter) may be needed as well. Some prostate gland problems require surgery to correct. Call your caregiver for more information. Document Released: 09/28/2004 Document Revised: 11/13/2011 Document Reviewed: 09/23/2008 [facility] Patient Information 2014 [place], [place].

## 2013-04-10 LAB — URINE CULTURE

## 2013-04-11 MED ORDER — CIPROFLOXACIN HCL 250 MG PO TABS: 250.0000 mg | ORAL_TABLET | Freq: Two times a day (BID) | ORAL | Status: DC

## 2013-04-11 NOTE — Addendum Note (Signed)
Addended by: Kern Reap B on: 04/11/2013 03:19 PM   Modules accepted: Orders

## 2013-04-18 ENCOUNTER — Other Ambulatory Visit: Payer: Self-pay | Admitting: Family Medicine

## 2013-04-18 DIAGNOSIS — N390 Urinary tract infection, site not specified: Secondary | ICD-10-CM

## 2013-04-23 ENCOUNTER — Encounter: Payer: Self-pay | Admitting: Internal Medicine

## 2013-05-12 ENCOUNTER — Telehealth: Payer: Self-pay | Admitting: Internal Medicine

## 2013-05-12 ENCOUNTER — Other Ambulatory Visit: Payer: Self-pay | Admitting: Family Medicine

## 2013-05-12 DIAGNOSIS — R32 Unspecified urinary incontinence: Secondary | ICD-10-CM

## 2013-05-12 DIAGNOSIS — R410 Disorientation, unspecified: Secondary | ICD-10-CM

## 2013-05-12 NOTE — Telephone Encounter (Signed)
Calling about her mother, [name]. She has hx of UTIs. Has a Urology referral but appt is not for several weeks. Mother is extremely confused since last night, with incontinence at night. She wants to know if she can bring in a clean catch urine sample for testing. Please advise and call.

## 2013-05-12 NOTE — Telephone Encounter (Signed)
Orders placed in the system.  Per WP, okay to bring in a urine.  Tried reaching [place] by telephone.  Will need to be placed on the lab schedule.

## 2013-05-13 ENCOUNTER — Other Ambulatory Visit: Payer: Self-pay | Admitting: Family Medicine

## 2013-05-13 ENCOUNTER — Other Ambulatory Visit (INDEPENDENT_AMBULATORY_CARE_PROVIDER_SITE_OTHER): Payer: Medicare Other

## 2013-05-13 DIAGNOSIS — F29 Unspecified psychosis not due to a substance or known physiological condition: Secondary | ICD-10-CM

## 2013-05-13 DIAGNOSIS — R32 Unspecified urinary incontinence: Secondary | ICD-10-CM

## 2013-05-13 DIAGNOSIS — R410 Disorientation, unspecified: Secondary | ICD-10-CM

## 2013-05-13 LAB — POCT URINALYSIS DIPSTICK
Glucose, UA: NEGATIVE
Ketones, UA: NEGATIVE
Spec Grav, UA: 1.01

## 2013-05-13 NOTE — Telephone Encounter (Signed)
Spoke to [place].  She will drop off urine after she comes by to pick up a cup and wipes.

## 2013-05-26 ENCOUNTER — Inpatient Hospital Stay (HOSPITAL_COMMUNITY)
Admission: EM | Admit: 2013-05-26 | Discharge: 2013-05-29 | DRG: 313 | Disposition: A | Discharge status: Home Health | Payer: Medicare Other | Attending: Internal Medicine | Admitting: Internal Medicine

## 2013-05-26 ENCOUNTER — Emergency Department (HOSPITAL_COMMUNITY): Payer: Medicare Other

## 2013-05-26 ENCOUNTER — Observation Stay (HOSPITAL_COMMUNITY): Payer: Medicare Other

## 2013-05-26 ENCOUNTER — Encounter (HOSPITAL_COMMUNITY): Payer: Self-pay | Admitting: Cardiology

## 2013-05-26 DIAGNOSIS — S2239XA Fracture of one rib, unspecified side, initial encounter for closed fracture: Secondary | ICD-10-CM | POA: Diagnosis present

## 2013-05-26 DIAGNOSIS — E876 Hypokalemia: Secondary | ICD-10-CM | POA: Diagnosis present

## 2013-05-26 DIAGNOSIS — M810 Age-related osteoporosis without current pathological fracture: Secondary | ICD-10-CM | POA: Diagnosis present

## 2013-05-26 DIAGNOSIS — N390 Urinary tract infection, site not specified: Secondary | ICD-10-CM

## 2013-05-26 DIAGNOSIS — R079 Chest pain, unspecified: Secondary | ICD-10-CM

## 2013-05-26 DIAGNOSIS — M7989 Other specified soft tissue disorders: Secondary | ICD-10-CM | POA: Diagnosis present

## 2013-05-26 DIAGNOSIS — Z66 Do not resuscitate: Secondary | ICD-10-CM | POA: Diagnosis present

## 2013-05-26 DIAGNOSIS — D649 Anemia, unspecified: Secondary | ICD-10-CM

## 2013-05-26 DIAGNOSIS — Z862 Personal history of diseases of the blood and blood-forming organs and certain disorders involving the immune mechanism: Secondary | ICD-10-CM

## 2013-05-26 DIAGNOSIS — G40909 Epilepsy, unspecified, not intractable, without status epilepticus: Secondary | ICD-10-CM | POA: Diagnosis present

## 2013-05-26 DIAGNOSIS — R071 Chest pain on breathing: Secondary | ICD-10-CM

## 2013-05-26 DIAGNOSIS — I5031 Acute diastolic (congestive) heart failure: Secondary | ICD-10-CM | POA: Diagnosis present

## 2013-05-26 DIAGNOSIS — J449 Chronic obstructive pulmonary disease, unspecified: Secondary | ICD-10-CM | POA: Diagnosis present

## 2013-05-26 DIAGNOSIS — J811 Chronic pulmonary edema: Secondary | ICD-10-CM | POA: Diagnosis present

## 2013-05-26 DIAGNOSIS — E871 Hypo-osmolality and hyponatremia: Secondary | ICD-10-CM | POA: Diagnosis present

## 2013-05-26 DIAGNOSIS — M1990 Unspecified osteoarthritis, unspecified site: Secondary | ICD-10-CM | POA: Diagnosis present

## 2013-05-26 DIAGNOSIS — F0390 Unspecified dementia without behavioral disturbance: Secondary | ICD-10-CM | POA: Diagnosis present

## 2013-05-26 DIAGNOSIS — R0789 Other chest pain: Secondary | ICD-10-CM | POA: Diagnosis present

## 2013-05-26 DIAGNOSIS — M129 Arthropathy, unspecified: Secondary | ICD-10-CM

## 2013-05-26 DIAGNOSIS — R2681 Unsteadiness on feet: Secondary | ICD-10-CM

## 2013-05-26 DIAGNOSIS — J4489 Other specified chronic obstructive pulmonary disease: Secondary | ICD-10-CM | POA: Diagnosis present

## 2013-05-26 DIAGNOSIS — R531 Weakness: Secondary | ICD-10-CM

## 2013-05-26 DIAGNOSIS — F068 Other specified mental disorders due to known physiological condition: Secondary | ICD-10-CM | POA: Diagnosis present

## 2013-05-26 DIAGNOSIS — I509 Heart failure, unspecified: Secondary | ICD-10-CM | POA: Diagnosis present

## 2013-05-26 DIAGNOSIS — W19XXXA Unspecified fall, initial encounter: Secondary | ICD-10-CM

## 2013-05-26 DIAGNOSIS — E785 Hyperlipidemia, unspecified: Secondary | ICD-10-CM | POA: Diagnosis present

## 2013-05-26 DIAGNOSIS — R32 Unspecified urinary incontinence: Secondary | ICD-10-CM | POA: Diagnosis present

## 2013-05-26 DIAGNOSIS — R269 Unspecified abnormalities of gait and mobility: Secondary | ICD-10-CM

## 2013-05-26 DIAGNOSIS — I10 Essential (primary) hypertension: Secondary | ICD-10-CM | POA: Diagnosis present

## 2013-05-26 DIAGNOSIS — F0391 Unspecified dementia with behavioral disturbance: Secondary | ICD-10-CM

## 2013-05-26 HISTORY — DX: Unspecified dementia, unspecified severity, without behavioral disturbance, psychotic disturbance, mood disturbance, and anxiety: F03.90

## 2013-05-26 HISTORY — DX: Other chest pain: R07.89

## 2013-05-26 HISTORY — DX: Pleural effusion, not elsewhere classified: J90

## 2013-05-26 HISTORY — DX: Wedge compression fracture of unspecified thoracic vertebra, initial encounter for closed fracture: S22.000A

## 2013-05-26 HISTORY — DX: Chronic obstructive pulmonary disease, unspecified: J44.9

## 2013-05-26 HISTORY — DX: Pain in thoracic spine: M54.6

## 2013-05-26 HISTORY — DX: Unspecified osteoarthritis, unspecified site: M19.90

## 2013-05-26 HISTORY — DX: Unspecified convulsions: R56.9

## 2013-05-26 LAB — URINALYSIS, ROUTINE W REFLEX MICROSCOPIC
Bilirubin Urine: NEGATIVE
Leukocytes, UA: NEGATIVE
Nitrite: NEGATIVE
Protein, ur: NEGATIVE mg/dL
Specific Gravity, Urine: 1.008 (ref 1.005–1.030)
Urobilinogen, UA: 0.2 mg/dL (ref 0.0–1.0)

## 2013-05-26 LAB — TROPONIN I: Troponin I: <0.3 ng/mL (ref <0.30)

## 2013-05-26 LAB — POCT I-STAT, CHEM 8
BUN: 16 mg/dL (ref 6–23)
Calcium, Ion: 1.16 mmol/L (ref 1.13–1.30)
Chloride: 96 mEq/L (ref 96–112)
Creatinine, Ser: 0.8 mg/dL (ref 0.50–1.10)
Glucose, Bld: 109 mg/dL — ABNORMAL HIGH (ref 70–99)

## 2013-05-26 MED ORDER — ONDANSETRON HCL 4 MG PO TABS: 4.0000 mg | ORAL_TABLET | Freq: Four times a day (QID) | ORAL | Status: DC | PRN

## 2013-05-26 MED ORDER — HYDROCODONE-ACETAMINOPHEN 5-325 MG PO TABS
1.0000 | ORAL_TABLET | ORAL | Status: DC | PRN
  Administered 2013-05-28 – 2013-05-29 (×4): 1 via ORAL
  Filled 2013-05-26 (×4): qty 1

## 2013-05-26 MED ORDER — LATANOPROST 0.005 % OP SOLN
1.0000 [drp] | Freq: Every day | OPHTHALMIC | Status: DC
  Filled 2013-05-26: qty 2.5

## 2013-05-26 MED ORDER — MORPHINE SULFATE 2 MG/ML IJ SOLN: 1.0000 mg | INTRAMUSCULAR | Status: DC | PRN

## 2013-05-26 MED ORDER — CYCLOSPORINE 0.05 % OP EMUL
1.0000 [drp] | Freq: Two times a day (BID) | OPHTHALMIC | Status: DC
  Administered 2013-05-26 – 2013-05-29 (×6): 1 [drp] via OPHTHALMIC
  Filled 2013-05-26 (×9): qty 1

## 2013-05-26 MED ORDER — AMLODIPINE BESYLATE 2.5 MG PO TABS
2.5000 mg | ORAL_TABLET | Freq: Every day | ORAL | Status: DC
  Administered 2013-05-27 – 2013-05-29 (×3): 2.5 mg via ORAL
  Filled 2013-05-26 (×3): qty 1

## 2013-05-26 MED ORDER — FUROSEMIDE 10 MG/ML IJ SOLN
40.0000 mg | Freq: Two times a day (BID) | INTRAMUSCULAR | Status: DC
  Administered 2013-05-26 – 2013-05-27 (×2): 40 mg via INTRAVENOUS
  Filled 2013-05-26 (×4): qty 4

## 2013-05-26 MED ORDER — ONDANSETRON HCL 4 MG/2ML IJ SOLN: 4.0000 mg | Freq: Three times a day (TID) | INTRAMUSCULAR | Status: DC | PRN

## 2013-05-26 MED ORDER — ASPIRIN 81 MG PO TABS: 81.0000 mg | ORAL_TABLET | ORAL | Status: DC

## 2013-05-26 MED ORDER — ACETAMINOPHEN 325 MG PO TABS
650.0000 mg | ORAL_TABLET | Freq: Four times a day (QID) | ORAL | Status: DC | PRN
  Administered 2013-05-26 – 2013-05-27 (×4): 650 mg via ORAL
  Filled 2013-05-26 (×5): qty 2

## 2013-05-26 MED ORDER — ASPIRIN EC 81 MG PO TBEC
81.0000 mg | DELAYED_RELEASE_TABLET | Freq: Every day | ORAL | Status: DC
  Administered 2013-05-26 – 2013-05-29 (×4): 81 mg via ORAL
  Filled 2013-05-26 (×4): qty 1

## 2013-05-26 MED ORDER — HEPARIN SODIUM (PORCINE) 5000 UNIT/ML IJ SOLN
5000.0000 [IU] | Freq: Three times a day (TID) | INTRAMUSCULAR | Status: DC
  Administered 2013-05-26 – 2013-05-29 (×9): 5000 [IU] via SUBCUTANEOUS
  Filled 2013-05-26 (×12): qty 1

## 2013-05-26 MED ORDER — ACETAMINOPHEN 650 MG RE SUPP: 650.0000 mg | Freq: Four times a day (QID) | RECTAL | Status: DC | PRN

## 2013-05-26 MED ORDER — DONEPEZIL HCL 10 MG PO TABS
10.0000 mg | ORAL_TABLET | Freq: Every day | ORAL | Status: DC
  Administered 2013-05-26 – 2013-05-28 (×3): 10 mg via ORAL
  Filled 2013-05-26 (×5): qty 1

## 2013-05-26 MED ORDER — INFLUENZA VAC SPLIT QUAD 0.5 ML IM SUSP
0.5000 mL | INTRAMUSCULAR | Status: AC
  Administered 2013-05-27: 0.5 mL via INTRAMUSCULAR
  Filled 2013-05-26: qty 0.5

## 2013-05-26 MED ORDER — ACETAMINOPHEN 325 MG PO TABS: 650.0000 mg | ORAL_TABLET | Freq: Once | ORAL | Status: DC

## 2013-05-26 MED ORDER — ALUM & MAG HYDROXIDE-SIMETH 200-200-20 MG/5ML PO SUSP: 30.0000 mL | Freq: Four times a day (QID) | ORAL | Status: DC | PRN

## 2013-05-26 MED ORDER — SODIUM CHLORIDE 0.9 % IJ SOLN
3.0000 mL | Freq: Two times a day (BID) | INTRAMUSCULAR | Status: DC
  Administered 2013-05-26 – 2013-05-29 (×6): 3 mL via INTRAVENOUS

## 2013-05-26 MED ORDER — ONDANSETRON HCL 4 MG/2ML IJ SOLN: 4.0000 mg | Freq: Four times a day (QID) | INTRAMUSCULAR | Status: DC | PRN

## 2013-05-26 MED ORDER — NITROGLYCERIN 0.4 MG SL SUBL
SUBLINGUAL_TABLET | SUBLINGUAL | Status: AC
  Filled 2013-05-26: qty 62.5

## 2013-05-26 MED ORDER — PREDNISONE 1 MG PO TABS
1.0000 mg | ORAL_TABLET | Freq: Every day | ORAL | Status: DC
  Administered 2013-05-27 – 2013-05-29 (×3): 1 mg via ORAL
  Filled 2013-05-26 (×5): qty 1

## 2013-05-26 MED ORDER — TRAVOPROST (BAK FREE) 0.004 % OP SOLN
1.0000 [drp] | Freq: Every day | OPHTHALMIC | Status: DC
  Administered 2013-05-26 – 2013-05-28 (×3): 1 [drp] via OPHTHALMIC

## 2013-05-26 NOTE — H&P (Signed)
[facility] History and Physical  [name]:[id number] DOB: 03/27/1919 DOA: 05/26/2013  Referring physician: Radford Pax PCP: [name], MD  Specialists:   Chief Complaint: [name] at the adult day care  HPI: [name] is a 79 y.o. female with past medical history of hypertension, osteoporosis and dementia. Patient came in to the hospital after she fell in the adult daycare. Patient is poor historian, she is accompanied by her daughter. Patient is Dr. [name] the mother-in-law. Most of the history was obtained from her daughter, as far as she knows there is no history of fever, chills or chest pain. After the fall patient was complaining about left-sided chest pain. In the ED initial evaluation showed 12-lead EKG negative for ischemia, cardiac enzymes are negative. Chest x-ray showed mild pulmonary edema.  Review of Systems: Review of systems unobtainable secondary to dementia.  Past Medical History  Diagnosis Date  . Anemia   . Hyperlipidemia   . Hypertension   . Seizure disorder     poss from hponatremia when had pneumonia  . Recurrent UTI   . Urinary incontinence     mild  . Osteoporosis     Grade II spondylotistesis at l4-l5 testing in [place] 2008 and confirmed that she has osteoporosis  . Spondylolisthesis of lumbar region     Grade II spondylotistesis at l4-l5 testing in [place] 2008   . Contusion of leg 08/06/2011  . Hematoma 08/06/2011    leg near shin no bov underlying bony abnormality and no infection. should take a while to resolve observe for infection   . Chest wall pain     "off and on" (05/26/2013)  . COPD (chronic obstructive pulmonary disease)   . Pleural effusion     "a few times" (05/26/2013)  . Seizures ~ 2009    "after pneumonia; electrolytes were all messed up" (05/26/2013)  . Pneumonia 09/2002; ~ 2009;   . Arthritis   . Osteoarthritis   . Thoracic compression fracture   . Thoracic back pain   . Skin cancer 2012    leg   . Dementia     Past Surgical History  Procedure Laterality Date  . Abdominal hysterectomy    . Lumbar disc surgery    . Breast lumpectomy Bilateral      had fibrofatty tissue  . Cataract extraction w/ intraocular lens implant Left   . Glaucoma surgery Left   . Eye surgery    . Skin cancer excision  2012   Social History:  reports that she has never smoked. She has never used smokeless tobacco. She reports that she does not drink alcohol or use illicit drugs.   Allergies  Allergen Reactions  . Betimol [Timolol] Swelling  . Codeine Nausea And Vomiting  . Gabapentin Other (See Comments)     tremors  . Sulfonamide Derivatives Hives    Family History  Problem Relation Age of Onset  . Mental retardation Daughter   . Hearing loss Daughter    Prior to Admission medications   Medication Sig Start Date End Date Taking? Authorizing Provider  amLODipine (NORVASC) 2.5 MG tablet Take 1 tablet (2.5 mg total) by mouth daily. 12/03/12  Yes [name], MD  aspirin 81 MG tablet Take 81 mg by mouth every other day.    Yes Historical Provider, MD  Calcium Carbonate-Vitamin D (CALCIUM + D PO) Take by mouth 2 (two) times daily.   Yes Historical Provider, MD  cycloSPORINE (RESTASIS) 0.05 % ophthalmic emulsion Place  1 drop into both eyes 2 (two) times daily.    Yes Historical Provider, MD  donepezil (ARICEPT) 10 MG tablet Take 1 tablet (10 mg total) by mouth at bedtime. 06/28/12  Yes [name], MD  ibandronate (BONIVA) 150 MG tablet Take 1 tablet (150 mg total) by mouth every 30 (thirty) days. Take in the morning with a full glass of water, on an empty stomach, and do not take anything else by mouth or lie down for the next 30 min. - on the 1st Sunday of each month 12/19/12  Yes [name], MD  methenamine (HIPREX) 1 G tablet Take 1 g by mouth daily. 05/20/13  Yes Historical Provider, MD  methylcellulose (ARTIFICIAL TEARS) 1 % ophthalmic solution Place 1 drop into both eyes 4 (four) times daily. 12/19/12   Yes [name], MD  predniSONE (DELTASONE) 1 MG tablet Take 1 mg by mouth daily.    Yes Historical Provider, MD  Travoprost, BAK Free, (TRAVATAN) 0.004 % SOLN ophthalmic solution Place 1 drop into both eyes at bedtime.   Yes Historical Provider, MD  ciprofloxacin (CIPRO) 250 MG tablet Take 1 tablet (250 mg total) by mouth 2 (two) times daily. 04/11/13   [name], MD   Physical Exam: Filed Vitals:   05/26/13 1600  BP: 120/51  Pulse: 70  Temp:   Resp:   General appearance: alert, cooperative and no distress  Head: Normocephalic, without obvious abnormality, atraumatic  Eyes: conjunctivae/corneas clear. PERRL, EOM's intact. Fundi benign.  Nose: Nares normal. Septum midline. Mucosa normal. No drainage or sinus tenderness.  Throat: lips, mucosa, and tongue normal; teeth and gums normal  Neck: Supple, no masses, no cervical lymphadenopathy, no JVD appreciated, no meningeal signs Resp: clear to auscultation bilaterally  Chest wall: There is severe tenderness in the left side of chest wall Cardio: regular rate and rhythm, S1, S2 normal, no murmur, click, rub or gallop  GI: soft, non-tender; bowel sounds normal; no masses, no organomegaly  Extremities: extremities normal, atraumatic, no cyanosis or edema  Skin: Skin color, texture, turgor normal. No rashes or lesions  Neurologic: Pleasantly confused, denies any significant complaints.  Labs on Admission:  Basic Metabolic Panel:  Recent Labs Lab 05/26/13 1405  NA 132*  K 3.9  CL 96  GLUCOSE 109*  BUN 16  CREATININE 0.80   Liver Function Tests: No results found for this basename: AST, ALT, ALKPHOS, BILITOT, PROT, ALBUMIN,  in the last 168 hours No results found for this basename: LIPASE, AMYLASE,  in the last 168 hours No results found for this basename: AMMONIA,  in the last 168 hours CBC:  Recent Labs Lab 05/26/13 1405  HGB 13.3  HCT 39.0   Cardiac Enzymes: No results found for this basename: CKTOTAL, CKMB,  CKMBINDEX, TROPONINI,  in the last 168 hours  BNP (last 3 results)  Recent Labs  01/10/13 1216  PROBNP 157.0*   CBG: No results found for this basename: GLUCAP,  in the last 168 hours  Radiological Exams on Admission: Dg Chest Portable 1 View  05/26/2013   CLINICAL DATA:  Fall. Generalized weakness.  EXAM: PORTABLE CHEST - 1 VIEW  COMPARISON:  01/30/2013.  FINDINGS: Low volume chest. There is blunting of both costophrenic angles compatible with small bilateral pleural effusions. Interstitial pulmonary edema appears present with Kerley B lines at the periphery. Low volumes accentuate the pulmonary vasculature. The cardiopericardial silhouette appears upper limits of normal for projection. Calcification over the heart shadow suggests mitral  valvular calcification. Vertebral augmentation scars that vertebroplasty noted in the lower thoracic spine. Densely calcified thoracic aortic arch. Bilateral pleural apical scarring. No focal consolidation is identified. Airspace disease at the bases is difficult to exclude based on atelectasis and low volumes.  IMPRESSION: Low volume chest with small bilateral pleural effusions and interstitial pulmonary edema. Findings consistent with mild CHF.   Electronically Signed   By: Andreas Newport M.D.   On: 05/26/2013 15:04    EKG: Independently reviewed.  Assessment/Plan Principal Problem:   Chest wall pain Active Problems:   DEMENTIA, MILD   HYPONATREMIA, HX OF   DNR (do not resuscitate)   Fall   Pulmonary edema   Chest pain -Chest wall pain, happened after the fall. -Obtained left-sided rib x-rays, rule out broken ribs, patient has history of osteoporosis. -I think the pain is purely musculoskeletal in nature, patient has severe tenderness in the left side. -Anyway I will rule out ACS by 3 sets of cardiac enzymes.  Pulmonary edema -Mild pulmonary edema, patient does not have history of CHF. -Started on IV Lasix, check BNP and 2-D  echocardiogram. -Patient has chronic left lower extremity swelling. -There is no labs on admission, sodium from i-STAT chem 8 is 132.  Fall -Apparently patient did not remember how she did fall, no type of prodrome described prior to the fall. -Patient sometimes forgets that she even fell. -2-D echocardiogram will be checked, check urinalysis to rule out UTI.  Dementia -Continue preadmission home medications.   Code Status: DO NOT RESUSCITATE Family Communication: Discussed with the patient with the presence of her daughter at bedside. Disposition Plan: Observation  Time spent: 70 minutes  [facility] Pager [phone]  If 7PM-7AM, please contact night-coverage [email or web address] Password [facility] 05/26/2013, 6:36 PM

## 2013-05-26 NOTE — ED Notes (Signed)
Pt to department via EMS-pt reports she started having midsternal chest pain while a the adult center. Pt had a fall at the center prior to the chest pain. Pt denies any n/v or diaphoresis. Pt given 325 ASA and 2 SL nitro, no pain reduction. Pt reports increased pain with movement. 22g LAC. Bp-130/50 Hr-60 irregular. No cardiac Hx.

## 2013-05-26 NOTE — ED Notes (Signed)
Pt reports she does not want the tylenol at this time.

## 2013-05-26 NOTE — ED Provider Notes (Addendum)
CSN: [id number]     Arrival date & time 05/26/13  1235 History   First MD Initiated Contact with Patient 05/26/13 1237     Chief Complaint  Patient presents with  . Chest Pain    HPI Pt to department via EMS-pt reports she started having midsternal chest pain while a the adult center. Pt had a fall at the center prior to the chest pain. Pt denies any n/v or diaphoresis. Pt given 325 ASA and 2 SL nitro, no pain reduction. Pt reports increased pain with movement. 22g LAC. Bp-130/50 Hr-60 irregular. No cardiac Hx.   Past Medical History  Diagnosis Date  . Anemia   . Hyperlipidemia   . Hypertension   . Seizure disorder     poss from hponatremia when had pneumonia  . Recurrent UTI   . Urinary incontinence     mild  . Osteoporosis     Grade II spondylotistesis at l4-l5 testing in [place] 2008 and confirmed that she has osteoporosis  . Spondylolisthesis of lumbar region     Grade II spondylotistesis at l4-l5 testing in [place] 2008   . Contusion of leg 08/06/2011  . Hematoma 08/06/2011    leg near shin no bov underlying bony abnormality and no infection. should take a while to resolve observe for infection   . Chest wall pain     "off and on" (05/26/2013)  . COPD (chronic obstructive pulmonary disease)   . Pleural effusion     "a few times" (05/26/2013)  . Seizures ~ 2009    "after pneumonia; electrolytes were all messed up" (05/26/2013)  . Pneumonia 09/2002; ~ 2009;   . Arthritis   . Osteoarthritis   . Thoracic compression fracture   . Thoracic back pain   . Skin cancer 2012    leg   . Dementia    Past Surgical History  Procedure Laterality Date  . Abdominal hysterectomy    . Lumbar disc surgery    . Breast lumpectomy Bilateral      had fibrofatty tissue  . Cataract extraction w/ intraocular lens implant Left   . Glaucoma surgery Left   . Eye surgery    . Skin cancer excision  2012   Family History  Problem Relation Age of Onset  . Mental retardation Daughter   . Hearing loss  Daughter    History  Substance Use Topics  . Smoking status: Never Smoker   . Smokeless tobacco: Never Used  . Alcohol Use: No   OB History   Grav Para Term Preterm Abortions TAB SAB Ect Mult Living   4 4             Review of Systems  Unable to perform ROS: Dementia    Allergies  Betimol; Codeine; Gabapentin; and Sulfonamide derivatives  Home Medications   No current outpatient prescriptions on file. BP 120/51  Pulse 70  Temp(Src) 98.1 F (36.7 C) (Oral)  Resp 16  SpO2 99% Physical Exam  Nursing note and vitals reviewed. Constitutional: She appears well-developed and well-nourished. No distress.  HENT:  Head: Normocephalic and atraumatic.  Eyes: Pupils are equal, round, and reactive to light.  Neck: Normal range of motion.  Cardiovascular: Normal rate and intact distal pulses.   Pulmonary/Chest: No respiratory distress.    Palpation of the noted area reproduces her chest pain  Abdominal: Normal appearance. She exhibits no distension.  Musculoskeletal: Normal range of motion.  Neurological: She is alert. No cranial nerve deficit.  Skin: Skin  is warm and dry. No rash noted.  Psychiatric: She has a normal mood and affect. Her behavior is normal.    ED Course  Procedures (including critical care time)  Date: 05/26/2013  Rate: 79  Rhythm: normal sinus rhythm  QRS Axis: normal  Intervals: normal  ST/T Wave abnormalities: Borderline T wave abnormalities  Conduction Disutrbances: none  Narrative Interpretation: Borderline EKG     Labs Review Labs Reviewed  POCT I-STAT, CHEM 8 - Abnormal; Notable for the following:    Sodium 132 (*)    Glucose, Bld 109 (*)    All other components within normal limits  BASIC METABOLIC PANEL  CBC  URINALYSIS, ROUTINE W REFLEX MICROSCOPIC  PRO B NATRIURETIC PEPTIDE  TROPONIN I  TROPONIN I  TROPONIN I  POCT I-STAT TROPONIN I   Imaging Review Dg Chest Portable 1 View  05/26/2013   CLINICAL DATA:  Fall. Generalized  weakness.  EXAM: PORTABLE CHEST - 1 VIEW  COMPARISON:  01/30/2013.  FINDINGS: Low volume chest. There is blunting of both costophrenic angles compatible with small bilateral pleural effusions. Interstitial pulmonary edema appears present with Kerley B lines at the periphery. Low volumes accentuate the pulmonary vasculature. The cardiopericardial silhouette appears upper limits of normal for projection. Calcification over the heart shadow suggests mitral valvular calcification. Vertebral augmentation scars that vertebroplasty noted in the lower thoracic spine. Densely calcified thoracic aortic arch. Bilateral pleural apical scarring. No focal consolidation is identified. Airspace disease at the bases is difficult to exclude based on atelectasis and low volumes.  IMPRESSION: Low volume chest with small bilateral pleural effusions and interstitial pulmonary edema. Findings consistent with mild CHF.   Electronically Signed   By: Andreas Newport M.D.   On: 05/26/2013 15:04    Pt with no SOB or hypoxia. No peripheral edema or JVD.    MDM   1. Chest wall pain   2. Anemia, unspecified   3. DNR (do not resuscitate)   4. Gait instability        Nelia Shi, MD 05/26/13 2014  Nelia Shi, MD 05/26/13 2016

## 2013-05-27 DIAGNOSIS — J811 Chronic pulmonary edema: Secondary | ICD-10-CM

## 2013-05-27 DIAGNOSIS — I369 Nonrheumatic tricuspid valve disorder, unspecified: Secondary | ICD-10-CM

## 2013-05-27 LAB — BASIC METABOLIC PANEL
BUN: 19 mg/dL (ref 6–23)
CO2: 26 mEq/L (ref 19–32)
Chloride: 89 mEq/L — ABNORMAL LOW (ref 96–112)
Creatinine, Ser: 0.72 mg/dL (ref 0.50–1.10)
Glucose, Bld: 147 mg/dL — ABNORMAL HIGH (ref 70–99)
Potassium: 3.3 mEq/L — ABNORMAL LOW (ref 3.5–5.1)
Sodium: 127 mEq/L — ABNORMAL LOW (ref 135–145)

## 2013-05-27 LAB — CBC
HCT: 37.6 % (ref 36.0–46.0)
Hemoglobin: 13.3 g/dL (ref 12.0–15.0)
MCH: 31.2 pg (ref 26.0–34.0)
MCHC: 35.4 g/dL (ref 30.0–36.0)
MCV: 88.3 fL (ref 78.0–100.0)
RBC: 4.26 MIL/uL (ref 3.87–5.11)

## 2013-05-27 MED ORDER — POTASSIUM CHLORIDE CRYS ER 20 MEQ PO TBCR
40.0000 meq | EXTENDED_RELEASE_TABLET | Freq: Once | ORAL | Status: AC
  Administered 2013-05-27: 40 meq via ORAL
  Filled 2013-05-27: qty 2

## 2013-05-27 MED ORDER — MIRABEGRON ER 50 MG PO TB24
50.0000 mg | ORAL_TABLET | Freq: Every day | ORAL | Status: DC
  Administered 2013-05-27 – 2013-05-29 (×3): 50 mg via ORAL
  Filled 2013-05-27 (×3): qty 1

## 2013-05-27 MED ORDER — POTASSIUM CHLORIDE 20 MEQ/15ML (10%) PO LIQD
40.0000 meq | Freq: Once | ORAL | Status: DC
  Filled 2013-05-27: qty 30

## 2013-05-27 NOTE — Progress Notes (Signed)
UR Completed.  Mejia, Holly Moeser Jane [phone] 05/27/2013  

## 2013-05-27 NOTE — Progress Notes (Signed)
Spoke at length with pt's daughter who is a PT.  Pt has significant dementia.  Pt has needed S with adls for years now more due to decreased memory.  Daughter feels pt will not benefit from OT due to cognitive status but would like to talk to a PT to address her recent falls.  Will sign off.  Pt has 24/7  S at home. Tory Emerald, [place] [phone]

## 2013-05-27 NOTE — Progress Notes (Signed)
[facility] PROGRESS NOTE  LEANNDRA PEMBER HQI:[id number] DOB: 1919-05-30 DOA: 05/26/2013 PCP: [name], MD  Assessment/Plan:  1-Chest pain: Probably Musculoskeletal. Troponin times 3 negative. EKG: no ST segment elevation. ECHO with normal EF.   2-Pulmonary edema: Chest x ray with finding of mild CHF. She received 2 doses of IV lasix. Sat 92 % RA. I will hold IV lasix due to hyponatremia. Repeat B-met in am. Monitor for pulmonary edema. ECHO with EF 60 %. Unable to assess for diastolic dysfunction. Daily weight.   3-Fall: need PT evaluation.   4-Dementia: At baseline.   5-Hyponatremia: Patient last sodium level per records at 137 on may 2014. Sodium on admission at 133. Sodium this morning at 127. I will repeat B-met in am. Will hold on IV lasix.   6-Hypokalemia: Replete with 40 meq times one.    Code Status: DNR.  Family Communication: Patient is Dr. [name] the mother-in-law.  Patient is Care discussed with daughter.  Disposition Plan: to be determine.    Consultants:  none  Procedures:  ECHO pending.   Antibiotics:  none  HPI/Subjective: Patient denies chest pain, no dyspnea. Feeling well this morning.  Daughter relates patient has short term memory problems.  Unclear how patient fell, unclear if syncope.  Per daughter patient with prior history of hyponatremia, patient even had seizure from hyponatremia.   Objective: Filed Vitals:   05/27/13 0456  BP: 128/81  Pulse: 91  Temp: 99 F (37.2 C)  Resp: 24    Intake/Output Summary (Last 24 hours) at 05/27/13 1313 Last data filed at 05/27/13 0700  Gross per 24 hour  Intake    580 ml  Output    650 ml  Net    -70 ml   Filed Weights   05/27/13 0456  Weight: 61.689 kg (136 lb)    Exam:   General:  No distress.   Cardiovascular: S 1, S 2 RRR  Respiratory: decreases breath sounds, no wheezes, fine crackles.   Abdomen: BS present, soft, NT  Musculoskeletal: bilateral trace edema.    Data Reviewed: Basic Metabolic Panel:  Recent Labs Lab 05/26/13 1405 05/27/13 0745  NA 132* 127*  K 3.9 3.3*  CL 96 89*  CO2  --  26  GLUCOSE 109* 147*  BUN 16 19  CREATININE 0.80 0.72  CALCIUM  --  9.3   CBC:  Recent Labs Lab 05/26/13 1405 05/27/13 0745  WBC  --  9.0  HGB 13.3 13.3  HCT 39.0 37.6  MCV  --  88.3  PLT  --  248   Cardiac Enzymes:  Recent Labs Lab 05/26/13 2004 05/27/13 0100 05/27/13 0745  TROPONINI <0.30 <0.30 <0.30   BNP (last 3 results)  Recent Labs  01/10/13 1216 05/27/13 0100  PROBNP 157.0* 551.6*   CBG: No results found for this basename: GLUCAP,  in the last 168 hours  No results found for this or any previous visit (from the past 240 hour(s)).   Studies: Dg Ribs Unilateral W/chest Left  05/27/2013   CLINICAL DATA:  Left chest pain. Status post fall. Question fracture.  EXAM: LEFT RIBS AND CHEST - 3+ VIEW  COMPARISON:  Single view of the chest 05/26/2013 and PA and lateral chest 01/30/2013. CT chest 01/11/2013.  FINDINGS: No acute fracture is identified. Remote lower left rib fractures are seen as on the prior CT abdomen and pelvis. There is some left basilar airspace disease as seen on the study earlier today. Heart size is upper  normal. No pneumothorax is identified.  IMPRESSION: Remote left rib fractures. Negative for acute fracture.   Electronically Signed   By: Drusilla Kanner M.D.   On: 05/27/2013 00:24   Dg Chest Portable 1 View  05/26/2013   CLINICAL DATA:  Fall. Generalized weakness.  EXAM: PORTABLE CHEST - 1 VIEW  COMPARISON:  01/30/2013.  FINDINGS: Low volume chest. There is blunting of both costophrenic angles compatible with small bilateral pleural effusions. Interstitial pulmonary edema appears present with Kerley B lines at the periphery. Low volumes accentuate the pulmonary vasculature. The cardiopericardial silhouette appears upper limits of normal for projection. Calcification over the heart shadow suggests mitral  valvular calcification. Vertebral augmentation scars that vertebroplasty noted in the lower thoracic spine. Densely calcified thoracic aortic arch. Bilateral pleural apical scarring. No focal consolidation is identified. Airspace disease at the bases is difficult to exclude based on atelectasis and low volumes.  IMPRESSION: Low volume chest with small bilateral pleural effusions and interstitial pulmonary edema. Findings consistent with mild CHF.   Electronically Signed   By: Andreas Newport M.D.   On: 05/26/2013 15:04    Scheduled Meds: . amLODipine  2.5 mg Oral Daily  . aspirin EC  81 mg Oral Daily  . cycloSPORINE  1 drop Both Eyes BID  . donepezil  10 mg Oral QHS  . heparin  5,000 Units Subcutaneous Q8H  . predniSONE  1 mg Oral Q breakfast  . sodium chloride  3 mL Intravenous Q12H  . Travoprost (BAK Free)  1 drop Both Eyes QHS   Continuous Infusions:   Principal Problem:   Chest wall pain Active Problems:   DEMENTIA, MILD   HYPONATREMIA, HX OF   DNR (do not resuscitate)   Fall   Pulmonary edema    Time spent: 25 minutes.     REGALADO,BELKYS  [facility] Pager [phone]. If 7PM-7AM, please contact night-coverage at [email or web address], password [facility] 05/27/2013, 1:13 PM  LOS: 1 day

## 2013-05-27 NOTE — Evaluation (Signed)
Physical Therapy Evaluation Patient Details Name: [name] MRN: [id number] DOB: December 24, 1918 Today's Date: 05/27/2013 Time: 7829-5621 PT Time Calculation (min): 50 min  PT Assessment / Plan / Recommendation History of Present Illness  [name] is a 79 y.o. female with past medical history of hypertension, osteoporosis and dementia. Patient came in to the hospital after she fell in the adult daycare. Patient is poor historian, .After the fall patient was complaining about left-sided chest pain.  Clinical Impression  Pt pleasant but requires max encouragement and assist to initiate mobility. Dgtr present to provide history. Pt can normally mobilize with supervision and is much weaker today not able to perform normal tasks with below deficits. Pt will benefit from acute therapy to maximize mobility, function and gait to return pt to PLOF and decrease burden of care. Dgtr reports she is a little overwhelmed caring for mother, sister with Parkinson's, disabled brother in [place] and planning to have Sx herself. Pt reported chest pain throughout session, unable to get a pulse ox reading due to chilly fingers with pt in NSR up to 120 throughout.    PT Assessment  Patient needs continued PT services    Follow Up Recommendations  Home health PT    Does the patient have the potential to tolerate intense rehabilitation      Barriers to Discharge Decreased caregiver support dgtr will be having sx tomorrow and will not be able to assist at normal level of care    Equipment Recommendations  Other (comment) (rollator)    Recommendations for Other Services     Frequency Min 3X/week    Precautions / Restrictions Precautions Precautions: Fall   Pertinent Vitals/Pain Grossly 5/10 chest pain      Mobility  Bed Mobility Bed Mobility: Supine to Sit Supine to Sit: 3: Mod assist;HOB flat;With rails Details for Bed Mobility Assistance: assist to pivot legs to EOB and elevate trunk . pt with  decreased initiation with assist to initiate as well as complete Transfers Transfers: Sit to Stand;Stand to Sit;Stand Pivot Transfers Sit to Stand: 4: Min assist;3: Mod assist;From bed;From toilet;Other (comment) (steps) Stand to Sit: To toilet;To chair/3-in-1;Other (comment) (steps) Stand Pivot Transfers: 4: Min assist Details for Transfer Assistance: Pt mod assist to stand from bed and during gait sat in stairwell with mod assist to sit and stand from low step, min assist to stand from [facility] and to sit in recliner end of session. Cueing for hand placement, safety and position backing to surfaces Ambulation/Gait Ambulation/Gait Assistance: 4: Min guard Ambulation Distance (Feet): 150 Feet (x 2 with seated rest on stair) Ambulation/Gait Assistance Details: decreased stride with extremely kyphotic posture unable to extend and cues to look up and step into RW Gait Pattern: Trunk flexed;Step-through pattern;Decreased stride length Gait velocity: decreased Stairs: No (got to stairwell but pt unable to attempt)    Exercises     PT Diagnosis: Difficulty walking;Altered mental status  PT Problem List: Decreased strength;Decreased activity tolerance;Decreased balance;Decreased mobility;Decreased knowledge of use of DME;Pain PT Treatment Interventions: Gait training;DME instruction;Stair training;Functional mobility training;Therapeutic activities;Therapeutic exercise;Patient/family education     PT Goals(Current goals can be found in the care plan section) Acute Rehab PT Goals Patient Stated Goal: go home PT Goal Formulation: With patient/family Time For Goal Achievement: 06/10/13 Potential to Achieve Goals: Fair  Visit Information  Last PT Received On: 05/27/13 Assistance Needed: +1 History of Present Illness: [name] is a 79 y.o. female with past medical history of hypertension, osteoporosis and  dementia. Patient came in to the hospital after she fell in the adult daycare. Patient is  poor historian, .After the fall patient was complaining about left-sided chest pain.       Prior Functioning  Home Living Family/patient expects to be discharged to:: Private residence Living Arrangements: Children Available Help at Discharge: Family;Available 24 hours/day;Personal care attendant Type of Home: House Home Access: Stairs to enter [employer] of Steps: 3 Home Layout: Able to live on main level with bedroom/bathroom Home Equipment: Walker - 4 wheels;Shower seat;Grab bars - toilet;Grab bars - tub/shower;Transport chair Prior Function Level of Independence: Needs assistance Gait / Transfers Assistance Needed: supervision for gait and transfers ADL's / Homemaking Assistance Needed: assist for all homemaking and supervision for transfers into bath and for completeness Communication Communication: No difficulties Dominant Hand: Right    Cognition  Cognition Arousal/Alertness: Awake/alert Behavior During Therapy: WFL for tasks assessed/performed Overall Cognitive Status: History of cognitive impairments - at baseline    Extremity/Trunk Assessment Upper Extremity Assessment Upper Extremity Assessment: Generalized weakness Lower Extremity Assessment Lower Extremity Assessment: Generalized weakness Cervical / Trunk Assessment Cervical / Trunk Assessment: Kyphotic   Balance    End of Session PT - End of Session Equipment Utilized During Treatment: Gait belt Activity Tolerance: Patient limited by pain;Patient limited by fatigue Patient left: in chair;with call bell/phone within reach;with family/visitor present Nurse Communication: Mobility status  GP     Delorse Lek 05/27/2013, 2:42 PM Delaney Meigs, PT [phone]

## 2013-05-27 NOTE — Progress Notes (Signed)
Patient has eye gtts. From home at bedside that pharm. Has approved of .

## 2013-05-27 NOTE — Progress Notes (Signed)
*  PRELIMINARY RESULTS* Echocardiogram 2D Echocardiogram has been performed.  [name] 05/27/2013, 12:13 PM

## 2013-05-27 NOTE — Care Management Note (Signed)
    Page 1 of 2   05/29/2013     3:27:47 PM   CARE MANAGEMENT NOTE 05/29/2013  Patient:  [name], [name]   Account Number:  [id number]  Date Initiated:  05/27/2013  Documentation initiated by:  Lyndol Vanderheiden  Subjective/Objective Assessment:   PT ADM ON 05/26/13 WITH CHEST PAIN, FALL.  PTA, PT LIVES AT HOME WITH FAMILY.     Action/Plan:   WILL FOLLOW FOR HOME NEEDS AS PT PROGRESSES.  P.T. RECOMMENDING HH FOLLOW UP, ROLLATOR FOR HOME.   Anticipated DC Date:  05/28/2013   Anticipated DC Plan:  HOME W HOME HEALTH SERVICES      DC Planning Services  CM consult      [facility] Choice  HOME HEALTH   Choice offered to / List presented to:  C-2 HC POA / Guardian   DME arranged  WALKER [facility] BED  SHOWER STOOL      DME agency  [facility]     HH arranged  HH-1 RN  HH-2 PT      [facility] agency  [facility]   Status of service:  Completed, signed off Medicare Important Message given?   (If response is "NO", the following Medicare IM given date fields will be blank) Date Medicare IM given:   Date Additional Medicare IM given:    Discharge Disposition:  HOME W HOME HEALTH SERVICES  Per UR Regulation:  Reviewed for med. necessity/level of care/duration of stay  If discussed at Long Length of Stay Meetings, dates discussed:    Comments:  05/29/13 Brandolyn Shortridge,RN,BSN [phone] PT FOR DC HOME TODAY WITH DAUGHTER AND SON IN LAW.  HAS OTHER PAID CARETAKERS TO ASSIST AS WELL.  DAUGHTER REQUESTS HOSP BED TODAY; WILL ADD TO DME NEEDS.  [facility] TO DELIVER ALL DME TO PT'S HOME LATER TODAY.  05/28/13 Karimah Winquist,RN,BSN [phone] PT FOR LIKELY DC TOMORROW.  SPOKE WITH PT'S DAUGHTER BY PHONE, TO CONFIRM DC PLANS AND NEEDS.  PT WILL NEED HHPT, ROLLATOR WALKER, AND SHOWER CHAIR WITH BACK.  WILL ARRANGE HH AND DME; REFERRAL TO [facility], PER DAUGHTER'S CHOICE, FOR HH AND DME.  START OF CARE 24-48H POST DC DATE.

## 2013-05-28 DIAGNOSIS — W19XXXA Unspecified fall, initial encounter: Secondary | ICD-10-CM

## 2013-05-28 DIAGNOSIS — R079 Chest pain, unspecified: Secondary | ICD-10-CM

## 2013-05-28 DIAGNOSIS — R5381 Other malaise: Secondary | ICD-10-CM

## 2013-05-28 DIAGNOSIS — F0391 Unspecified dementia with behavioral disturbance: Secondary | ICD-10-CM

## 2013-05-28 DIAGNOSIS — F03918 Unspecified dementia, unspecified severity, with other behavioral disturbance: Secondary | ICD-10-CM

## 2013-05-28 DIAGNOSIS — M129 Arthropathy, unspecified: Secondary | ICD-10-CM

## 2013-05-28 LAB — BASIC METABOLIC PANEL
CO2: 26 mEq/L (ref 19–32)
Calcium: 8.7 mg/dL (ref 8.4–10.5)
Chloride: 96 mEq/L (ref 96–112)
Glucose, Bld: 117 mg/dL — ABNORMAL HIGH (ref 70–99)
Potassium: 4.1 mEq/L (ref 3.5–5.1)
Sodium: 131 mEq/L — ABNORMAL LOW (ref 135–145)

## 2013-05-28 LAB — PRO B NATRIURETIC PEPTIDE: Pro B Natriuretic peptide (BNP): 338.4 pg/mL (ref 0–450)

## 2013-05-28 MED ORDER — LIDOCAINE 5 % EX PTCH
1.0000 | MEDICATED_PATCH | CUTANEOUS | Status: DC
  Administered 2013-05-28 – 2013-05-29 (×2): 1 via TRANSDERMAL
  Filled 2013-05-28 (×3): qty 1

## 2013-05-28 MED ORDER — NITROGLYCERIN 0.4 MG SL SUBL
SUBLINGUAL_TABLET | SUBLINGUAL | Status: AC
  Administered 2013-05-28: 11:00:00
  Filled 2013-05-28: qty 187.5

## 2013-05-28 MED ORDER — NITROGLYCERIN 0.4 MG SL SUBL: 0.4000 mg | SUBLINGUAL_TABLET | SUBLINGUAL | Status: DC | PRN

## 2013-05-28 NOTE — Progress Notes (Addendum)
05/26/13 Nursing note Late entry Daughter at bedside and concerned that patient had not voided todaysince admission,  Patient 2x assist to bed side of commode, patient unsteady on feet (patient had fallen prior to admission) patient voided and had BMx1, .assist back to bed. Family at bedside..Will continue to monitor patient. Dilon Lank, Randall An RN

## 2013-05-28 NOTE — Progress Notes (Addendum)
Dr.Zamora, MD made aware of pt complaining of chest pain 10/10. 12 lead EKG done VS done and nitro sl 0.4 given . Will continue to monitor

## 2013-05-28 NOTE — Clinical Documentation Improvement (Signed)
THIS DOCUMENT IS NOT A PERMANENT PART OF THE MEDICAL RECORD  Please update your documentation with the medical record to reflect your response to this query. If you need help knowing how to do this please call [phone].  05/28/13  Dear Dr. [facility],  In a better effort to capture your patient's severity of illness, reflect appropriate length of stay and utilization of resources, a review of the patient medical record has revealed the following indicators the diagnosis of Heart Failure.    Based on your clinical judgment, please clarify and document in a progress note and/or discharge summary the clinical condition associated with the following supporting information:   Possible Clinical Conditions?  Chronic Systolic Congestive Heart Failure Chronic Diastolic Congestive Heart Failure Chronic Systolic & Diastolic Congestive Heart Failure Acute Systolic Congestive Heart Failure Acute Diastolic Congestive Heart Failure Acute Systolic & Diastolic Congestive Heart Failure Acute on Chronic Systolic Congestive Heart Failure Acute on Chronic Diastolic Congestive Heart Failure Acute on Chronic Systolic & Diastolic  Congestive Heart Failure Other Condition Cannot Clinically Determine    Risk Factors: Pulmonary edema: CXR with finding of mild CHF; received 2 doses of IV Lasix, noted per 9/23 progress notes.   Diagnostics: 9/22:  CXR: Findings consistent with mild CHF. 9/23:  ProBNP:  551.6  Reviewed: Additional documentation provided per 9/24 progress notes.                         Thank You,  Marciano Sequin,  Clinical Documentation Specialist: [phone] Health Information Management [place]

## 2013-05-28 NOTE — Progress Notes (Addendum)
[facility] PROGRESS NOTE  [name] ZOX:[id number] DOB: [birth year] DOA: 05/26/2013 PCP: [name], MD  Assessment/Plan: 1. Congestive heart failure, status post transthoracic echocardiogram showing a preserved ejection fraction of 60%. Suspected diastolic dysfunction. Patient receiving 2 doses of IV Lasix during this hospitalization, with clinical improvement. 2. Chest pain. Suspected be secondary to musculoskeletal origin. She has had 3 negative sets of cardiac enzymes, with an EKG showing no ischemic changes. Suspect this may have resulted from patient's fall. X-ray showed remote left-sided fractures without evidence of new fractures, will place lidocaine patch 3. Hyponatremia. Sodium improving to 131 from 127 yesterday. 4. Status post fall. Issue with history of advanced dementia. Physical therapy consulted. 5. Advanced dementia. Stable, patient appears to be near baseline.  Code Status: DO NOT RESUSCITATE Family Communication: Discussed case with Dr Maple Hudson over telephone conversation, updated on patient's condition.  Disposition Plan: Family would like for patient to be discharged home with home health services and home PT    HPI/Subjective: Patient reports feeling better this morning, has received IV Lasix during this hospitalization. She is presently satting mid 90s on room air. She is tolerating by mouth intake, having her last bowel movement yesterday per nursing staff.  Objective: Filed Vitals:   05/28/13 1006  BP: 115/63  Pulse:   Temp:   Resp:     Intake/Output Summary (Last 24 hours) at 05/28/13 1046 Last data filed at 05/28/13 0457  Gross per 24 hour  Intake    480 ml  Output    300 ml  Net    180 ml   Filed Weights   05/27/13 0456 05/28/13 0452  Weight: 61.689 kg (136 lb) 62.596 kg (138 lb)    Exam:   General:  Patient was in no acute distress confused disoriented  Cardiovascular: Regular rate and rhythm normal S1-S2  Respiratory:  Diminished breath sounds, poor respiratory effort. May have a few by basilar crackles  Abdomen: Soft nontender nondistended positive  Extremities: No cyanosis clubbing or edema   Data Reviewed: Basic Metabolic Panel:  Recent Labs Lab 05/26/13 1405 05/27/13 0745 05/28/13 0602  NA 132* 127* 131*  K 3.9 3.3* 4.1  CL 96 89* 96  CO2  --  26 26  GLUCOSE 109* 147* 117*  BUN 16 19 23   CREATININE 0.80 0.72 0.80  CALCIUM  --  9.3 8.7   Liver Function Tests: No results found for this basename: AST, ALT, ALKPHOS, BILITOT, PROT, ALBUMIN,  in the last 168 hours No results found for this basename: LIPASE, AMYLASE,  in the last 168 hours No results found for this basename: AMMONIA,  in the last 168 hours CBC:  Recent Labs Lab 05/26/13 1405 05/27/13 0745  WBC  --  9.0  HGB 13.3 13.3  HCT 39.0 37.6  MCV  --  88.3  PLT  --  248   Cardiac Enzymes:  Recent Labs Lab 05/26/13 2004 05/27/13 0100 05/27/13 0745  TROPONINI <0.30 <0.30 <0.30   BNP (last 3 results)  Recent Labs  01/10/13 1216 05/27/13 0100 05/28/13 0602  PROBNP 157.0* 551.6* 338.4   CBG: No results found for this basename: GLUCAP,  in the last 168 hours  No results found for this or any previous visit (from the past 240 hour(s)).   Studies: Dg Ribs Unilateral W/chest Left  05/27/2013   CLINICAL DATA:  Left chest pain. Status post fall. Question fracture.  EXAM: LEFT RIBS AND CHEST - 3+ VIEW  COMPARISON:  Single view of  the chest 05/26/2013 and PA and lateral chest 01/30/2013. CT chest 01/11/2013.  FINDINGS: No acute fracture is identified. Remote lower left rib fractures are seen as on the prior CT abdomen and pelvis. There is some left basilar airspace disease as seen on the study earlier today. Heart size is upper normal. No pneumothorax is identified.  IMPRESSION: Remote left rib fractures. Negative for acute fracture.   Electronically Signed   By: Drusilla Kanner M.D.   On: 05/27/2013 00:24   Dg Chest  Portable 1 View  05/26/2013   CLINICAL DATA:  Fall. Generalized weakness.  EXAM: PORTABLE CHEST - 1 VIEW  COMPARISON:  01/30/2013.  FINDINGS: Low volume chest. There is blunting of both costophrenic angles compatible with small bilateral pleural effusions. Interstitial pulmonary edema appears present with Kerley B lines at the periphery. Low volumes accentuate the pulmonary vasculature. The cardiopericardial silhouette appears upper limits of normal for projection. Calcification over the heart shadow suggests mitral valvular calcification. Vertebral augmentation scars that vertebroplasty noted in the lower thoracic spine. Densely calcified thoracic aortic arch. Bilateral pleural apical scarring. No focal consolidation is identified. Airspace disease at the bases is difficult to exclude based on atelectasis and low volumes.  IMPRESSION: Low volume chest with small bilateral pleural effusions and interstitial pulmonary edema. Findings consistent with mild CHF.   Electronically Signed   By: Andreas Newport M.D.   On: 05/26/2013 15:04    Scheduled Meds: . amLODipine  2.5 mg Oral Daily  . aspirin EC  81 mg Oral Daily  . cycloSPORINE  1 drop Both Eyes BID  . donepezil  10 mg Oral QHS  . heparin  5,000 Units Subcutaneous Q8H  . mirabegron ER  50 mg Oral Daily  . predniSONE  1 mg Oral Q breakfast  . sodium chloride  3 mL Intravenous Q12H  . Travoprost (BAK Free)  1 drop Both Eyes QHS   Continuous Infusions:   Principal Problem:   Chest wall pain Active Problems:   DEMENTIA, MILD   HYPONATREMIA, HX OF   DNR (do not resuscitate)   Fall   Pulmonary edema    Time spent: 35 minutes    Jeralyn Bennett  [facility] Pager [phone]. If 7PM-7AM, please contact night-coverage at [email or web address], password [facility] 05/28/2013, 10:46 AM  LOS: 2 days     Addendum I suspect patient has acute diastolic CHF. Echo showing preserved EF of 60%

## 2013-05-28 NOTE — Progress Notes (Addendum)
05/26/13 Nursing Note Late entry  Patient experiencing chest pain on arrival to unit from ED, moaning and very uncomfortable. Patient Alert and oriented to self. Pain in Left side of chest and hurts when you press on patients chest, pain seems to increase with movement. EKG obtained, patient NSR on monitor. Dr Arthor Captain Made aware and orders received.  After a few moments resting patient states pain is easing off and now resting comfortably in bed.  Will continue to closely monitor patient. Brayden Betters, Randall An RN

## 2013-05-29 DIAGNOSIS — N390 Urinary tract infection, site not specified: Secondary | ICD-10-CM

## 2013-05-29 MED ORDER — FUROSEMIDE 20 MG PO TABS: 20.0000 mg | ORAL_TABLET | Freq: Every day | ORAL | Status: DC

## 2013-05-29 MED ORDER — LIDOCAINE 5 % EX PTCH: 1.0000 | MEDICATED_PATCH | CUTANEOUS | Status: DC

## 2013-05-29 NOTE — Progress Notes (Signed)
Followed up with patients daughter.  She requested that an ambulance be sent to pick her  Mom up and that she needed a hospital bed, these needs were expressed to the social worker and case management.  She also expressed to PT, Maija, that she wanted to use [facility] instead of [facility] as previously discussed with the case manager.  These needs were expressed to case management.  [facility] had delivered a shower chair and came to pick it up before pts daughter left.

## 2013-05-29 NOTE — Progress Notes (Signed)
Physical Therapy Treatment Patient Details Name: [name] MRN: [id number] DOB: [birth year] Today's Date: 05/29/2013 Time: 4098-1191 PT Time Calculation (min): 28 min  PT Assessment / Plan / Recommendation  History of Present Illness [name] is a 79 y.o. female with past medical history of hypertension, osteoporosis and dementia. Patient came in to the hospital after she fell in the adult daycare. Patient is poor historian, .After the fall patient was complaining about left-sided chest pain.   PT Comments   Pt progressing with mobility today able to perform transfers with less assist but continues to report constant left sided chest pain that changes from dull to sharp with mobility without change on tele. Dgtr present throughout and denied need to attempt stairs for DC today. Will continue to follow.   Follow Up Recommendations  Home health PT;Supervision for mobility/OOB     Does the patient have the potential to tolerate intense rehabilitation     Barriers to Discharge        Equipment Recommendations       Recommendations for Other Services    Frequency     Progress towards PT Goals Progress towards PT goals: Progressing toward goals  Plan Current plan remains appropriate    Precautions / Restrictions Precautions Precautions: Fall   Pertinent Vitals/Pain Sharp CP on left , no telemetry changes Premedicated, Rn aware    Mobility  Bed Mobility Bed Mobility: Sitting - Scoot to Edge of Bed Supine to Sit: HOB flat;4: Min guard Sitting - Scoot to [place] of Bed: 3: Mod assist Details for Bed Mobility Assistance: pt able to achieve sitting without physical assist today with use of rail and required assist to scoot pt to EOB with pad and cueing Transfers Sit to Stand: 4: Min assist;From bed;From chair/3-in-1 Stand to Sit: 4: Min assist;To chair/3-in-1 Stand Pivot Transfers: 4: Min assist Details for Transfer Assistance: cueing for hand placement and sequence with  standing from bed and pivot to [facility] with RW prior to gait. Assist to scoot back in chair with pad end of session. assist for anterior translation to standing Ambulation/Gait Ambulation/Gait Assistance: 4: Min guard Ambulation Distance (Feet): 160 Feet Assistive device: Rolling walker Ambulation/Gait Assistance Details: kyphotic posture and unable to extend but doing better with trying to look up today. Cueing for directions and safety throughout Gait Pattern: Trunk flexed;Step-through pattern;Decreased stride length Gait velocity: decreased Stairs: No    Exercises     PT Diagnosis:    PT Problem List:   PT Treatment Interventions:     PT Goals (current goals can now be found in the care plan section)    Visit Information  Last PT Received On: 05/29/13 Assistance Needed: +1 History of Present Illness: [name] is a 79 y.o. female with past medical history of hypertension, osteoporosis and dementia. Patient came in to the hospital after she fell in the adult daycare. Patient is poor historian, .After the fall patient was complaining about left-sided chest pain.    Subjective Data      Cognition  Cognition Arousal/Alertness: Awake/alert Behavior During Therapy: WFL for tasks assessed/performed Overall Cognitive Status: History of cognitive impairments - at baseline    Balance     End of Session PT - End of Session Equipment Utilized During Treatment: Gait belt Activity Tolerance: Patient limited by pain;Patient limited by fatigue Patient left: in chair;with call bell/phone within reach;with family/visitor present Nurse Communication: Mobility status   GP     Toney Sang [facility] 05/29/2013,  10:26 AM Delaney Meigs, PT [phone]

## 2013-05-29 NOTE — Progress Notes (Signed)
Pt's Daughter came in room.  Was very upset regarding her mom.m  Pt had eaten breakfast without her dentures.  Pt was assessed this morning while eating her breakfast.  Her breakfast was set up when I entered the room.  She was eating independently without distress.  I asked her if she was okay and she said "yes I am doing just fine" and if she needed anything.  She said "no thank you".  I was unaware that the patient wore dentures when rounding during breakfast.  The daughter requested to speak with Ms Rennis Harding, Unit director.  In her absence I spoke with the charge nurse, Dois Davenport and Belenda Cruise, RN IV.  Dois Davenport went in to talk with the family.

## 2013-05-29 NOTE — Progress Notes (Signed)
CSW arranging transportation to patients home by non emergency-EMS. CSW has contacted [facility] and is signing off.  Maree Krabbe, MSW, Theresia Majors [phone]

## 2013-05-29 NOTE — Progress Notes (Signed)
Pt discharged to home via ambulance.  Pt's bag that was brought in with clothes by son in law was transported with her.  She had dentures in her mouth along with her glasses.  No complaints at this time.

## 2013-05-29 NOTE — Progress Notes (Signed)
Discharge instructions were given to Dr. [name], Pt's son in law.  He had already been to pick up rx's at patients pharmacy.  Told him the patient had a lidoderm patch on that was placed at 12.  IV was removed by NT, Debra.  Pt was changed into paper gown, robe, house shoes and panties.  Pt kept dentures in place and glasses on  Awaiting ambulance arrival

## 2013-05-29 NOTE — Discharge Summary (Signed)
Physician Discharge Summary  [name] ZOX:[id number] DOB: [birth year] DOA: 05/26/2013  PCP: [name], MD  Admit date: 05/26/2013 Discharge date: 05/29/2013  Time spent: 35 minutes  Recommendations for Outpatient Follow-up:  1. Please followup on thoracic wall pain.   Discharge Diagnoses:  Principal Problem:   Chest wall pain Active Problems:   DEMENTIA, MILD   HYPONATREMIA, HX OF   DNR (do not resuscitate)   Fall   Pulmonary edema  Status post fall, secondary to generalized weakness and unsteady gait  Discharge Condition: Stable/improved  Diet recommendation: Heart healthy diet  Filed Weights   05/27/13 0456 05/28/13 0452 05/29/13 0435  Weight: 61.689 kg (136 lb) 62.596 kg (138 lb) 63.458 kg (139 lb 14.4 oz)    History of present illness:  [name] is a 79 y.o. female with past medical history of hypertension, osteoporosis and dementia. Patient came in to the hospital after she fell in the adult daycare. Patient is poor historian, she is accompanied by her daughter. Patient is Dr. [name] the mother-in-law. Most of the history was obtained from her daughter, as far as she knows there is no history of fever, chills or chest pain. After the fall patient was complaining about left-sided chest pain.  In the ED initial evaluation showed 12-lead EKG negative for ischemia, cardiac enzymes are negative. Chest x-ray showed mild pulmonary edema.   Hospital Course:  Patient is a pleasant 79 year old female with a past medical history of dementia, poor functional status, who had a fall at her adult daycare center. Immediately after her fall she experienced sharp stabbing left-sided thoracic wall pain. She was brought to emergency department where initial EKG did not show evidence for ischemia, with cardiac enzymes come back negative. Chest x-ray showed small bilateral pleural effusions with interstitial pulmonary edema. A dedicated rib x-ray showed remote left rib  fractures, negative for acute fracture. She was admitted to medicine for further evaluation and treatment. With regard to congestive heart failure, she had transthoracic echocardiogram which showed a preserved ejection fraction of 60%. Suspect diastolic dysfunction. She received 2 doses of IV Lasix during this hospitalization. She complained of severe left-sided thoracic wall pain, for which a lidocaine patch was placed, together with when necessary Tylenol and oxycodone. Physical therapy was consulted, and she was assisted out of bed to walker. With regard to her dementia, she appear to be functioning at her baseline level. Patient wasn't discharged in stable condi3tion on 05/29/2013. Social work consulted for home health services, physical therapy and occupational therapy. Order for hospital bed was also placed.  Procedures:  Transthoracic echocardiogram performed on 05/27/2013 impression: Ejection fraction 60-65%, study did not technically sufficient to allow evaluation of LV diastolic function. There was moderate concentric hypertrophy  Consultations:  Physical therapy  Discharge Exam: Filed Vitals:   05/29/13 0435  BP: 125/77  Pulse: 86  Temp: 99.1 F (37.3 C)  Resp: 20    General: No acute distress, patient reports feeling better. She was assisted out of bed to walker. Tolerating by mouth intake Cardiovascular: Regular rate and rhythm normal S1-S2 Respiratory: Lungs clear to auscultation bilaterally no wheezing rhonchi rales Abdomen: Soft nontender nondistended positive bowel Extremity: No edema Musculoskeletal: Patient having pain to palpation over her left thoracic wall  Discharge Instructions  Discharge Orders   Future Orders Complete By Expires   Call MD for:  difficulty breathing, headache or visual disturbances  As directed    Call MD for:  extreme fatigue  As directed  Call MD for:  persistant dizziness or light-headedness  As directed    Call MD for:  redness,  tenderness, or signs of infection (pain, swelling, redness, odor or green/yellow discharge around incision site)  As directed    Call MD for:  severe uncontrolled pain  As directed    Diet - low sodium heart healthy  As directed    Discharge instructions  As directed    Comments:     Please keep Dr. follow up appointments   Face-to-face encounter (required for Medicare/Medicaid patients)  As directed    Comments:     I Raylee Adamec certify that this patient is under my care and that I, or a nurse practitioner or physician's assistant working with me, had a face-to-face encounter that meets the physician face-to-face encounter requirements with this patient on 05/29/2013. The encounter with the patient was in whole, or in part for the following medical condition(s) which is the primary reason for home health care, patient with deconditioning, requiring assistance with transfers.   Questions:     The encounter with the patient was in whole, or in part, for the following medical condition, which is the primary reason for home health care:  Deconditioning, home bound   I certify that, based on my findings, the following services are medically necessary home health services:  Physical therapy   Nursing   My clinical findings support the need for the above services:  Can transfer bed to chair only   Further, I certify that my clinical findings support that this patient is homebound due to:  Unable to leave home safely without assistance   Reason for Medically Necessary Home Health Services:  Therapy- Investment banker, operational, Patent examiner   Home Health  As directed    Questions:     To provide the following care/treatments:  PT   OT   Increase activity slowly  As directed        Medication List    STOP taking these medications       ciprofloxacin 250 MG tablet  Commonly known as:  CIPRO      TAKE these medications       amLODipine 2.5 MG tablet  Commonly known as:   NORVASC  Take 1 tablet (2.5 mg total) by mouth daily.     aspirin 81 MG tablet  Take 81 mg by mouth every other day.     CALCIUM + D PO  Take by mouth 2 (two) times daily.     cycloSPORINE 0.05 % ophthalmic emulsion  Commonly known as:  RESTASIS  Place 1 drop into both eyes 2 (two) times daily.     donepezil 10 MG tablet  Commonly known as:  ARICEPT  Take 1 tablet (10 mg total) by mouth at bedtime.     ibandronate 150 MG tablet  Commonly known as:  BONIVA  Take 1 tablet (150 mg total) by mouth every 30 (thirty) days. Take in the morning with a full glass of water, on an empty stomach, and do not take anything else by mouth or lie down for the next 30 min. - on the 1st Sunday of each month     lidocaine 5 %  Commonly known as:  LIDODERM  Place 1 patch onto the skin daily. Remove & Discard patch within 12 hours or as directed by MD     methenamine 1 G tablet  Commonly known as:  HIPREX  Take 1 g by mouth  daily.     methylcellulose 1 % ophthalmic solution  Commonly known as:  ARTIFICIAL TEARS  Place 1 drop into both eyes 4 (four) times daily.     mirabegron ER 50 MG Tb24 tablet  Commonly known as:  MYRBETRIQ  Take 50 mg by mouth daily.     predniSONE 1 MG tablet  Commonly known as:  DELTASONE  Take 1 mg by mouth daily.     Travoprost (BAK Free) 0.004 % Soln ophthalmic solution  Commonly known as:  TRAVATAN  Place 1 drop into both eyes at bedtime.       Allergies  Allergen Reactions  . Betimol [Timolol] Swelling  . Codeine Nausea And Vomiting  . Gabapentin Other (See Comments)     tremors  . Sulfonamide Derivatives Hives       Follow-up Information   Follow up with [name], MD In 1 week.   Specialty:  Internal Medicine   Contact information:   [address] [place] [address] [phone]        The results of significant diagnostics from this hospitalization (including imaging, microbiology, ancillary and laboratory) are listed  below for reference.    Significant Diagnostic Studies: Dg Ribs Unilateral W/chest Left  05/27/2013   CLINICAL DATA:  Left chest pain. Status post fall. Question fracture.  EXAM: LEFT RIBS AND CHEST - 3+ VIEW  COMPARISON:  Single view of the chest 05/26/2013 and PA and lateral chest 01/30/2013. CT chest 01/11/2013.  FINDINGS: No acute fracture is identified. Remote lower left rib fractures are seen as on the prior CT abdomen and pelvis. There is some left basilar airspace disease as seen on the study earlier today. Heart size is upper normal. No pneumothorax is identified.  IMPRESSION: Remote left rib fractures. Negative for acute fracture.   Electronically Signed   By: Drusilla Kanner M.D.   On: 05/27/2013 00:24   Dg Chest Portable 1 View  05/26/2013   CLINICAL DATA:  Fall. Generalized weakness.  EXAM: PORTABLE CHEST - 1 VIEW  COMPARISON:  01/30/2013.  FINDINGS: Low volume chest. There is blunting of both costophrenic angles compatible with small bilateral pleural effusions. Interstitial pulmonary edema appears present with Kerley B lines at the periphery. Low volumes accentuate the pulmonary vasculature. The cardiopericardial silhouette appears upper limits of normal for projection. Calcification over the heart shadow suggests mitral valvular calcification. Vertebral augmentation scars that vertebroplasty noted in the lower thoracic spine. Densely calcified thoracic aortic arch. Bilateral pleural apical scarring. No focal consolidation is identified. Airspace disease at the bases is difficult to exclude based on atelectasis and low volumes.  IMPRESSION: Low volume chest with small bilateral pleural effusions and interstitial pulmonary edema. Findings consistent with mild CHF.   Electronically Signed   By: Andreas Newport M.D.   On: 05/26/2013 15:04    Microbiology: No results found for this or any previous visit (from the past 240 hour(s)).   Labs: Basic Metabolic Panel:  Recent Labs Lab  05/26/13 1405 05/27/13 0745 05/28/13 0602  NA 132* 127* 131*  K 3.9 3.3* 4.1  CL 96 89* 96  CO2  --  26 26  GLUCOSE 109* 147* 117*  BUN 16 19 23   CREATININE 0.80 0.72 0.80  CALCIUM  --  9.3 8.7   Liver Function Tests: No results found for this basename: AST, ALT, ALKPHOS, BILITOT, PROT, ALBUMIN,  in the last 168 hours No results found for this basename: LIPASE, AMYLASE,  in the last 168 hours  No results found for this basename: AMMONIA,  in the last 168 hours CBC:  Recent Labs Lab 05/26/13 1405 05/27/13 0745  WBC  --  9.0  HGB 13.3 13.3  HCT 39.0 37.6  MCV  --  88.3  PLT  --  248   Cardiac Enzymes:  Recent Labs Lab 05/26/13 2004 05/27/13 0100 05/27/13 0745  TROPONINI <0.30 <0.30 <0.30   BNP: BNP (last 3 results)  Recent Labs  01/10/13 1216 05/27/13 0100 05/28/13 0602  PROBNP 157.0* 551.6* 338.4   CBG: No results found for this basename: GLUCAP,  in the last 168 hours     Signed:  Jeralyn Bennett  [facility] 05/29/2013, 9:28 AM

## 2013-05-31 ENCOUNTER — Other Ambulatory Visit: Payer: Self-pay | Admitting: Internal Medicine

## 2013-06-02 ENCOUNTER — Telehealth: Payer: Self-pay | Admitting: Family Medicine

## 2013-06-02 NOTE — Telephone Encounter (Signed)
This patient needs a follow up with [facility] in November or December.  Please contact the pt to make an appt.  Thanks!!

## 2013-06-03 ENCOUNTER — Telehealth: Payer: Self-pay | Admitting: Internal Medicine

## 2013-06-03 NOTE — Telephone Encounter (Signed)
[facility] Triage Call Report Triage Record Num: [id number] Operator: Roselyn Meier Patient Name: [name] Call Date & Time: 06/02/2013 7:56:20PM Patient Phone: PCP: Neta Mends. Panosh Patient Gender: Female PCP Fax : [phone] Patient DOB: 1919/08/23 Practice Name: [facility] Reason for Call: Caller: Liam Graham; PCP: Berniece Andreas (Family Practice); CB#: [phone]; Call regarding Injury/Trauma; Caller reports pt had a fall on 05/26/13. Caller states pt was in the hospital for 4 days. Caller is wanting to schedule an ER follow up for patient (she is concerned about pt being on lasix and wants sodium level checked). Rn advised for caller to contact the office in the daytime for appt. Caller agrees Protocol(s) Used: Office Note Recommended Outcome per Protocol: Information Noted and Sent to Office Reason for Outcome: Caller information to office Care Advice: ~ 09/

## 2013-06-04 ENCOUNTER — Telehealth: Payer: Self-pay | Admitting: Internal Medicine

## 2013-06-04 NOTE — Telephone Encounter (Signed)
lmom for pt to sch appt °

## 2013-06-04 NOTE — Telephone Encounter (Signed)
Pt daughter states that they stopped her lasix upon arrival at the ED, and then decided upon her leaving, to place her back on it. She states that she is concerned with the possibility of electrolyte imbalance, as she was experienced this previously with results of a seizure. She needs to be seen for a post hosp visit, as she was discharged on 05/29/13. Your next available is the end of next week. Please advise.

## 2013-06-05 NOTE — Telephone Encounter (Signed)
Tomorrow  30 minutes

## 2013-06-05 NOTE — Telephone Encounter (Signed)
Scheduled for 2:30 pm

## 2013-06-06 ENCOUNTER — Ambulatory Visit (INDEPENDENT_AMBULATORY_CARE_PROVIDER_SITE_OTHER): Payer: Medicare Other | Admitting: Internal Medicine

## 2013-06-06 ENCOUNTER — Encounter: Payer: Self-pay | Admitting: Internal Medicine

## 2013-06-06 VITALS — BP 126/50 | HR 60 | Temp 98.7°F | Wt 137.0 lb

## 2013-06-06 DIAGNOSIS — R269 Unspecified abnormalities of gait and mobility: Secondary | ICD-10-CM

## 2013-06-06 DIAGNOSIS — R071 Chest pain on breathing: Secondary | ICD-10-CM

## 2013-06-06 DIAGNOSIS — I509 Heart failure, unspecified: Secondary | ICD-10-CM

## 2013-06-06 DIAGNOSIS — R5381 Other malaise: Secondary | ICD-10-CM

## 2013-06-06 DIAGNOSIS — R531 Weakness: Secondary | ICD-10-CM

## 2013-06-06 DIAGNOSIS — R0789 Other chest pain: Secondary | ICD-10-CM

## 2013-06-06 DIAGNOSIS — Z09 Encounter for follow-up examination after completed treatment for conditions other than malignant neoplasm: Secondary | ICD-10-CM

## 2013-06-06 DIAGNOSIS — R2681 Unsteadiness on feet: Secondary | ICD-10-CM

## 2013-06-06 DIAGNOSIS — E871 Hypo-osmolality and hyponatremia: Secondary | ICD-10-CM

## 2013-06-06 DIAGNOSIS — Z9181 History of falling: Secondary | ICD-10-CM

## 2013-06-06 LAB — BASIC METABOLIC PANEL
BUN: 21 mg/dL (ref 6–23)
CO2: 30 mEq/L (ref 19–32)
Creatinine, Ser: 0.9 mg/dL (ref 0.4–1.2)
GFR: 62.18 mL/min (ref >60.00)
Glucose, Bld: 140 mg/dL — ABNORMAL HIGH (ref 70–99)
Potassium: 3.6 mEq/L (ref 3.5–5.1)
Sodium: 138 mEq/L (ref 135–145)

## 2013-06-06 NOTE — Telephone Encounter (Signed)
lmom for pt to sch appt °

## 2013-06-06 NOTE — Patient Instructions (Addendum)
i agree  Close monitoring  Sodium and potassium   Level   Do daily  weights to check for significant weight gain( fluid gain)   Will write  note for going back Monday October 13 for 3 hours at a time.

## 2013-06-06 NOTE — Progress Notes (Signed)
Chief Complaint  Patient presents with  . Follow-up    Hospital     HPI: Patient comes in today with daughter who is her legal guardian caretaker as follow up from hospitalization from 9/22-9/25 after  fall and thorace wall pain   Fell at center back oonto another person   No loc  Had mid and left chest pain   treated with lidocaine patch pain is significantly better than at discharge and she looks perkier X-ray revealed no acute bony trauma was felt to have chest wall trauma and noted to have mild CHF based on chest x-ray small effusions. Was given Lasix sodium level went down and back up the Lasix was restarted at discharge daughter's concern about monitoring because she has a history of significant hyponatremia On lasix  chf  Hx of hyponatremia  Neg acute ischemia  Echo done ef 60  Stable dc    bnp 551 to 300 range  At dc  Sodium went from 127 to 131 at dc  Cr  .8 Getting home PT nursing came out 2 times. Physical therapy is helping. We'll need a note to go back to adult daycare when ready. Still a bit weak and would not tolerate more than a few hours. ROS: See pertinent positives and negatives per HPI. Got flu vaccine in hospital  She is seeing the urologist who put her on a low-dose chronic antibiotic and myrbetric? If helping was dry one night  Past Medical History  Diagnosis Date  . Anemia   . Hyperlipidemia   . Hypertension   . Seizure disorder     poss from hponatremia when had pneumonia  . Recurrent UTI   . Urinary incontinence     mild  . Osteoporosis     Grade II spondylotistesis at l4-l5 testing in ealy 2008 and confirmed that she has osteoporosis  . Spondylolisthesis of lumbar region     Grade II spondylotistesis at l4-l5 testing in [place] 2008   . Contusion of leg 08/06/2011  . Hematoma 08/06/2011    leg near shin no bov underlying bony abnormality and no infection. should take a while to resolve observe for infection   . Chest wall pain     "off and on" (05/26/2013)   . COPD (chronic obstructive pulmonary disease)   . Pleural effusion     "a few times" (05/26/2013)  . Seizures ~ 2009    "after pneumonia; electrolytes were all messed up" (05/26/2013)  . Pneumonia 09/2002; ~ 2009;   . Arthritis   . Osteoarthritis   . Thoracic compression fracture   . Thoracic back pain   . Skin cancer 2012    leg   . Dementia     Family History  Problem Relation Age of Onset  . Mental retardation Daughter   . Hearing loss Daughter     History   Social History  . Marital Status: Widowed    Spouse Name: N/A    Number of Children: N/A  . Years of Education: N/A   Social History Main Topics  . Smoking status: Never Smoker   . Smokeless tobacco: Never Used  . Alcohol Use: No  . Drug Use: No  . Sexual Activity: No   Other Topics Concern  . None   Social History Narrative   Moved from [place] to daughter's home   [facility] of [facility]   Memory test conducted in 2002 and 2007   G4P4   Goes to adult day care in day  Outpatient Encounter Prescriptions as of 06/06/2013  Medication Sig Dispense Refill  . amLODipine (NORVASC) 2.5 MG tablet TAKE 1 TABLET BY MOUTH EVERY DAY  30 tablet  2  . aspirin 81 MG tablet Take 81 mg by mouth every other day.       . Calcium Carbonate-Vitamin D (CALCIUM + D PO) Take by mouth 2 (two) times daily.      . cycloSPORINE (RESTASIS) 0.05 % ophthalmic emulsion Place 1 drop into both eyes 2 (two) times daily.       Marland Kitchen donepezil (ARICEPT) 10 MG tablet Take 1 tablet (10 mg total) by mouth at bedtime.  90 tablet  3  . furosemide (LASIX) 20 MG tablet Take 1 tablet (20 mg total) by mouth daily.  30 tablet  0  . ibandronate (BONIVA) 150 MG tablet Take 1 tablet (150 mg total) by mouth every 30 (thirty) days. Take in the morning with a full glass of water, on an empty stomach, and do not take anything else by mouth or lie down for the next 30 min. - on the 1st Sunday of each month  3 tablet  3  . lidocaine (LIDODERM) 5 % Place 1 patch onto the skin daily.  Remove & Discard patch within 12 hours or as directed by MD  30 patch  0  . methenamine (HIPREX) 1 G tablet Take 1 g by mouth daily.      . methylcellulose (ARTIFICIAL TEARS) 1 % ophthalmic solution Place 1 drop into both eyes 4 (four) times daily.      . mirabegron ER (MYRBETRIQ) 50 MG TB24 tablet Take 50 mg by mouth daily.      . Travoprost, BAK Free, (TRAVATAN) 0.004 % SOLN ophthalmic solution Place 1 drop into both eyes at bedtime.      . [DISCONTINUED] predniSONE (DELTASONE) 1 MG tablet Take 1 mg by mouth daily.        No facility-administered encounter medications on file as of 06/06/2013.    EXAM:  BP 126/50  Pulse 60  Temp(Src) 98.7 F (37.1 C) (Oral)  Wt 137 lb (62.143 kg)  BMI 28.64 kg/m2  Body mass index is 28.64 kg/(m^2).  GENERAL: vitals reviewed and listed above, alert, sitting in wheelchair with weepy clear eyes cooperative minimally verbal response to daughter will smile has kyphosis no obvious distress HEENT: atraumatic,NECK: no obvious masses on inspection palpation no obvious JVD Chest clear to auscultation kyphosis some tenderness at lower sternum costochondral junction left no crepitus no bracing CV: HRRR, no clubbing cyanosis or  peripheral edema nl cap refill  MS: moves all extremities without noticeable focal  abnormality PSYCH: pleasant and cooperative,  ASSESSMENT AND PLAN:  Discussed the following assessment and plan:  Chest wall pain - Plan: Basic metabolic panel  Hyponatremia - Monitor is as on diuretic for symptom relief.  - Plan: Basic metabolic panel  Gait instability - Plan: Basic metabolic panel  Weakness generalized - Plan: Basic metabolic panel  Hx of falling  Hospital discharge follow-up - 8 days   CHF (congestive heart failure) - elevaetd bmp and ple effusions  better  diuretic rx  Echo nl lv mod tr  -Patient advised to return or notify health care team  if symptoms worsen or persist or new concerns arise. We'll send an order for  PT Patient Instructions  i agree  Close monitoring  Sodium and potassium   Level   Do daily  weights to check for significant weight gain( fluid gain)  Will write  note for going back Monday October 13 for 3 hours at a time.   Neta Mends. Anastasia Tompson M.D.  Study Conclusions  - Procedure narrative: Transthoracic echocardiography. Technically difficult study with foreshortening of the images. - Left ventricle: The cavity size was normal. There was moderate concentric hypertrophy. Systolic function was normal. The estimated ejection fraction was in the range of 60% to 65%. The study is not technically sufficient to allow evaluation of LV diastolic function. - Aortic valve: Calcified leaflet bases, especially the non-coronary cusp. Trivial regurgitation. - Mitral valve: Moderately calcified posterior annulus. Mild regurgitation. - Tricuspid valve: Moderate regurgitation. - Pulmonary arteries: PA peak pressure: 25mm Hg (S) + RAP. - Systemic veins: The IVC is not well-visualized. - Pericardium, extracardiac: There was no pericardial effusion.

## 2013-06-11 ENCOUNTER — Other Ambulatory Visit: Payer: Self-pay | Admitting: Family Medicine

## 2013-06-11 DIAGNOSIS — E871 Hypo-osmolality and hyponatremia: Secondary | ICD-10-CM

## 2013-06-11 DIAGNOSIS — R269 Unspecified abnormalities of gait and mobility: Secondary | ICD-10-CM

## 2013-06-11 DIAGNOSIS — Z9181 History of falling: Secondary | ICD-10-CM

## 2013-06-11 DIAGNOSIS — I10 Essential (primary) hypertension: Secondary | ICD-10-CM

## 2013-06-11 DIAGNOSIS — D649 Anemia, unspecified: Secondary | ICD-10-CM

## 2013-06-11 DIAGNOSIS — F0390 Unspecified dementia without behavioral disturbance: Secondary | ICD-10-CM

## 2013-06-11 DIAGNOSIS — M159 Polyosteoarthritis, unspecified: Secondary | ICD-10-CM

## 2013-06-13 ENCOUNTER — Encounter: Payer: Self-pay | Admitting: Family Medicine

## 2013-06-13 ENCOUNTER — Telehealth: Payer: Self-pay | Admitting: Internal Medicine

## 2013-06-13 NOTE — Telephone Encounter (Signed)
Please write a note for this and get to her  Thanks

## 2013-06-13 NOTE — Telephone Encounter (Signed)
Ok for the pt to return?

## 2013-06-13 NOTE — Telephone Encounter (Signed)
Pt needs a note to return to adult day care facility .  Note also needs to states  pt needs stand by assistance when walking. Daughter would like to take pt back to day care on Monday and would like note asap. Can fax note or she will pick up Fax  [phone]

## 2013-06-13 NOTE — Telephone Encounter (Signed)
Jennie notified that letter will be faxed to her personal fax #.

## 2013-06-17 NOTE — Telephone Encounter (Signed)
Line busy

## 2013-06-19 NOTE — Telephone Encounter (Signed)
lmom for pt to call back

## 2013-07-02 ENCOUNTER — Other Ambulatory Visit (INDEPENDENT_AMBULATORY_CARE_PROVIDER_SITE_OTHER): Payer: Medicare Other

## 2013-07-02 DIAGNOSIS — E871 Hypo-osmolality and hyponatremia: Secondary | ICD-10-CM

## 2013-07-02 LAB — BASIC METABOLIC PANEL
CO2: 31 mEq/L (ref 19–32)
Calcium: 9.2 mg/dL (ref 8.4–10.5)
Chloride: 97 mEq/L (ref 96–112)
Creatinine, Ser: 0.8 mg/dL (ref 0.4–1.2)
GFR: 75.57 mL/min (ref >60.00)
Glucose, Bld: 164 mg/dL — ABNORMAL HIGH (ref 70–99)
Potassium: 3.5 mEq/L (ref 3.5–5.1)
Sodium: 136 mEq/L (ref 135–145)

## 2013-07-03 ENCOUNTER — Telehealth: Payer: Self-pay | Admitting: Internal Medicine

## 2013-07-03 NOTE — Telephone Encounter (Signed)
Caller: [name]/Child; Phone: [phone]; Reason for Call: [name]/POA called to ask Dr Fabian Sharp if OK to bring clean catch UA specimen. Has supplies on hand.  Declined triage.  Reports temperature elevation 99.3 po at 0930; also had 3 episodes of urinary incontinence since 0600 07/03/13.  Please call back regarding approval to bring in urine specimen.

## 2013-07-04 ENCOUNTER — Other Ambulatory Visit (INDEPENDENT_AMBULATORY_CARE_PROVIDER_SITE_OTHER): Payer: Medicare Other

## 2013-07-04 ENCOUNTER — Other Ambulatory Visit: Payer: Self-pay | Admitting: Family Medicine

## 2013-07-04 DIAGNOSIS — R32 Unspecified urinary incontinence: Secondary | ICD-10-CM

## 2013-07-04 DIAGNOSIS — R300 Dysuria: Secondary | ICD-10-CM

## 2013-07-04 DIAGNOSIS — R509 Fever, unspecified: Secondary | ICD-10-CM

## 2013-07-04 LAB — POCT URINALYSIS DIPSTICK
Glucose, UA: NEGATIVE
Ketones, UA: NEGATIVE
Leukocytes, UA: NEGATIVE
Nitrite, UA: NEGATIVE
Protein, UA: NEGATIVE
Spec Grav, UA: 1.015
Urobilinogen, UA: 0.2

## 2013-07-04 NOTE — Telephone Encounter (Signed)
Pt placed on the lab schedule for urinalysis and culture if needed.

## 2013-07-07 ENCOUNTER — Encounter: Payer: Self-pay | Admitting: Internal Medicine

## 2013-07-07 ENCOUNTER — Encounter: Payer: Medicare Other | Admitting: Internal Medicine

## 2013-07-07 ENCOUNTER — Encounter (INDEPENDENT_AMBULATORY_CARE_PROVIDER_SITE_OTHER): Payer: Medicare Other | Admitting: Interventional Radiology

## 2013-07-07 ENCOUNTER — Other Ambulatory Visit: Payer: Self-pay | Admitting: Pulmonary Disease

## 2013-07-07 ENCOUNTER — Ambulatory Visit (INDEPENDENT_AMBULATORY_CARE_PROVIDER_SITE_OTHER)
Admission: RE | Admit: 2013-07-07 | Discharge: 2013-07-07 | Disposition: A | Discharge status: Home / Self Care | Payer: Medicare Other | Source: Ambulatory Visit | Attending: Pulmonary Disease | Admitting: Pulmonary Disease

## 2013-07-07 DIAGNOSIS — J189 Pneumonia, unspecified organism: Secondary | ICD-10-CM

## 2013-07-07 DIAGNOSIS — R410 Disorientation, unspecified: Secondary | ICD-10-CM

## 2013-07-07 DIAGNOSIS — F29 Unspecified psychosis not due to a substance or known physiological condition: Secondary | ICD-10-CM

## 2013-07-07 DIAGNOSIS — R32 Unspecified urinary incontinence: Secondary | ICD-10-CM

## 2013-07-07 LAB — URINALYSIS, ROUTINE W REFLEX MICROSCOPIC
Ketones, ur: NEGATIVE
Leukocytes, UA: NEGATIVE
Total Protein, Urine: NEGATIVE
Urine Glucose: NEGATIVE
Urobilinogen, UA: 0.2 (ref 0.0–1.0)

## 2013-07-07 LAB — CBC WITH DIFFERENTIAL/PLATELET
Basophils Absolute: 0 10*3/uL (ref 0.0–0.1)
Eosinophils Absolute: 0 10*3/uL (ref 0.0–0.7)
Lymphocytes Relative: 5.6 % — ABNORMAL LOW (ref 12.0–46.0)
MCHC: 33.7 g/dL (ref 30.0–36.0)
MCV: 87.5 fl (ref 78.0–100.0)
Monocytes Absolute: 0.8 10*3/uL (ref 0.1–1.0)
Neutrophils Relative %: 89.4 % — ABNORMAL HIGH (ref 43.0–77.0)
Platelets: 259 10*3/uL (ref 150.0–400.0)
RBC: 3.87 Mil/uL (ref 3.87–5.11)
RDW: 13.5 % (ref 11.5–14.6)

## 2013-07-07 LAB — COMPREHENSIVE METABOLIC PANEL
ALT: 14 U/L (ref 0–35)
AST: 16 U/L (ref 0–37)
Albumin: 3.1 g/dL — ABNORMAL LOW (ref 3.5–5.2)
Alkaline Phosphatase: 85 U/L (ref 39–117)
BUN: 18 mg/dL (ref 6–23)
Calcium: 8.6 mg/dL (ref 8.4–10.5)
Chloride: 94 mEq/L — ABNORMAL LOW (ref 96–112)
Potassium: 3.7 mEq/L (ref 3.5–5.1)
Sodium: 131 mEq/L — ABNORMAL LOW (ref 135–145)
Total Bilirubin: 0.6 mg/dL (ref 0.3–1.2)
Total Protein: 7.3 g/dL (ref 6.0–8.3)

## 2013-07-07 NOTE — Progress Notes (Signed)
Document opened and reviewed for OV but appt  canceled same day .  Family emergency after check in

## 2013-07-10 ENCOUNTER — Other Ambulatory Visit: Payer: Self-pay | Admitting: Family Medicine

## 2013-07-10 MED ORDER — NYSTATIN 100000 UNIT/ML MT SUSP: 5.0000 mL | Freq: Four times a day (QID) | OROMUCOSAL | Status: DC

## 2013-07-11 ENCOUNTER — Encounter: Payer: Self-pay | Admitting: Internal Medicine

## 2013-07-11 ENCOUNTER — Telehealth: Payer: Self-pay | Admitting: Internal Medicine

## 2013-07-11 ENCOUNTER — Ambulatory Visit (INDEPENDENT_AMBULATORY_CARE_PROVIDER_SITE_OTHER): Payer: Medicare Other | Admitting: Internal Medicine

## 2013-07-11 VITALS — BP 130/70 | HR 84 | Temp 98.0°F | Resp 20

## 2013-07-11 DIAGNOSIS — Z862 Personal history of diseases of the blood and blood-forming organs and certain disorders involving the immune mechanism: Secondary | ICD-10-CM

## 2013-07-11 DIAGNOSIS — F068 Other specified mental disorders due to known physiological condition: Secondary | ICD-10-CM

## 2013-07-11 DIAGNOSIS — J069 Acute upper respiratory infection, unspecified: Secondary | ICD-10-CM

## 2013-07-11 DIAGNOSIS — R5381 Other malaise: Secondary | ICD-10-CM

## 2013-07-11 DIAGNOSIS — R7309 Other abnormal glucose: Secondary | ICD-10-CM

## 2013-07-11 DIAGNOSIS — R531 Weakness: Secondary | ICD-10-CM

## 2013-07-11 DIAGNOSIS — I10 Essential (primary) hypertension: Secondary | ICD-10-CM

## 2013-07-11 DIAGNOSIS — R7302 Impaired glucose tolerance (oral): Secondary | ICD-10-CM | POA: Insufficient documentation

## 2013-07-11 NOTE — Patient Instructions (Signed)
Discontinue furosemide  Drink as much fluid as you  can tolerate over the next few days  Return office visit Monday if unimproved  Acute bronchitis symptoms for less than 10 days are generally not helped by antibiotics.  Take over-the-counter expectorants and cough medications such as  Mucinex DM.  Call if there is no improvement in 5 to 7 days or if  she develops  worsening cough, fever, or new symptoms, such as shortness of breath or chest pain.  Marland Kitchen

## 2013-07-11 NOTE — Progress Notes (Signed)
Subjective:    Patient ID: [name], female    DOB: 02/06/1919, 79 y.o.   MRN: [id number]  HPI Pre-visit discussion using our clinic review tool. No additional management support is needed unless otherwise documented below in the visit note.  79 year old patient who is seen today with a one-week history of increasing cough and fatigue. 4 days ago she had a chest x-ray performed revealed stable cardiomegaly and interstitial changes with low lung volumes. She was hospitalized in September of this year for chest wall pain. An echocardiogram was performed at that time that revealed a preserved ejection fraction. She was given a diagnosis of pulmonary edema during that hospital. Based on cardiomegaly and prominent interstitial changes. BNP was essentially normal however This past week she has had increasing cough weakness and hypersomnolence cough has been nonproductive. There's been no shortness of breath chest pain fever or chills. Her daughter states that she is still able to ambulate with a walker but has become weaker. Laboratory studies were also performed 4 days ago and revealed a white count of 16.7 and a serum sodium of 131. Medical regimen includes daily Lasix. She has also been treated recently for oral thrush with Mycostatin suspension  Past Medical History  Diagnosis Date  . Anemia   . Hyperlipidemia   . Hypertension   . Seizure disorder     poss from hponatremia when had pneumonia  . Recurrent UTI   . Urinary incontinence     mild  . Osteoporosis     Grade II spondylotistesis at l4-l5 testing in [place] 2008 and confirmed that she has osteoporosis  . Spondylolisthesis of lumbar region     Grade II spondylotistesis at l4-l5 testing in [place] 2008   . Contusion of leg 08/06/2011  . Hematoma 08/06/2011    leg near shin no bov underlying bony abnormality and no infection. should take a while to resolve observe for infection   . Chest wall pain     "off and on" (05/26/2013)  . COPD  (chronic obstructive pulmonary disease)   . Pleural effusion     "a few times" (05/26/2013)  . Seizures ~ 2009    "after pneumonia; electrolytes were all messed up" (05/26/2013)  . Pneumonia 09/2002; ~ 2009;   . Arthritis   . Osteoarthritis   . Thoracic compression fracture   . Thoracic back pain   . Skin cancer 2012    leg   . Dementia     History   Social History  . Marital Status: Widowed    Spouse Name: N/A    Number of Children: N/A  . Years of Education: N/A   Occupational History  . Not on file.   Social History Main Topics  . Smoking status: Never Smoker   . Smokeless tobacco: Never Used  . Alcohol Use: No  . Drug Use: No  . Sexual Activity: No   Other Topics Concern  . Not on file   Social History Narrative   Moved from [place] to daughter's home   [facility]   Memory test conducted in 2002 and 2007   G4P4   Goes to adult day care in day    Past Surgical History  Procedure Laterality Date  . Abdominal hysterectomy    . Lumbar disc surgery    . Breast lumpectomy Bilateral      had fibrofatty tissue  . Cataract extraction w/ intraocular lens implant Left   . Glaucoma surgery Left   .  Eye surgery    . Skin cancer excision  2012    Family History  Problem Relation Age of Onset  . Mental retardation Daughter   . Hearing loss Daughter     Allergies  Allergen Reactions  . Betimol [Timolol] Swelling  . Codeine Nausea And Vomiting  . Gabapentin Other (See Comments)     tremors  . Sulfonamide Derivatives Hives    Current Outpatient Prescriptions on File Prior to Visit  Medication Sig Dispense Refill  . amLODipine (NORVASC) 2.5 MG tablet TAKE 1 TABLET BY MOUTH EVERY DAY  30 tablet  2  . aspirin 81 MG tablet Take 81 mg by mouth every other day.       . Calcium Carbonate-Vitamin D (CALCIUM + D PO) Take by mouth 2 (two) times daily.      . cycloSPORINE (RESTASIS) 0.05 % ophthalmic emulsion Place 1 drop into both eyes 2 (two) times daily.       Marland Kitchen donepezil  (ARICEPT) 10 MG tablet Take 1 tablet (10 mg total) by mouth at bedtime.  90 tablet  3  . furosemide (LASIX) 20 MG tablet Take 1 tablet (20 mg total) by mouth daily.  30 tablet  0  . ibandronate (BONIVA) 150 MG tablet Take 1 tablet (150 mg total) by mouth every 30 (thirty) days. Take in the morning with a full glass of water, on an empty stomach, and do not take anything else by mouth or lie down for the next 30 min. - on the 1st Sunday of each month  3 tablet  3  . lidocaine (LIDODERM) 5 % Place 1 patch onto the skin daily. Remove & Discard patch within 12 hours or as directed by MD  30 patch  0  . methenamine (HIPREX) 1 G tablet Take 1 g by mouth daily.      . methylcellulose (ARTIFICIAL TEARS) 1 % ophthalmic solution Place 1 drop into both eyes 4 (four) times daily.      . mirabegron ER (MYRBETRIQ) 50 MG TB24 tablet Take 50 mg by mouth daily.      Marland Kitchen nystatin (MYCOSTATIN) 100000 UNIT/ML suspension Take 5 mLs (500,000 Units total) by mouth 4 (four) times daily. Swish and Swollow  240 mL  1  . Travoprost, BAK Free, (TRAVATAN) 0.004 % SOLN ophthalmic solution Place 1 drop into both eyes at bedtime.       No current facility-administered medications on file prior to visit.    BP 130/70  Pulse 84  Temp(Src) 98 F (36.7 C) (Oral)  Resp 20  SpO2 94%    Review of Systems  Constitutional: Positive for activity change, appetite change and fatigue. Negative for fever, chills and diaphoresis.  Respiratory: Positive for cough. Negative for shortness of breath and wheezing.   Cardiovascular: Negative for chest pain and leg swelling.  Neurological: Positive for weakness.  Psychiatric/Behavioral: Positive for behavioral problems, sleep disturbance and decreased concentration.       Objective:   Physical Exam  Constitutional: She is oriented to person, place, and time. She appears well-developed and well-nourished.  Elderly white female sitting in wheelchair in no distress Pleasantly  confused Temperature 98  HENT:  Head: Normocephalic.  Right Ear: External ear normal.  Left Ear: External ear normal.  Mouth/Throat: Oropharynx is clear and moist.  Mucosal membranes well hydrated Dentures in place No thrush  Eyes: Conjunctivae and EOM are normal. Pupils are equal, round, and reactive to light.  Neck: Normal range of motion. Neck supple.  No JVD present. No thyromegaly present.  Cardiovascular: Normal rate, regular rhythm, normal heart sounds and intact distal pulses.   Pulmonary/Chest: Effort normal.  Dry basilar rales Kyphosis  O2 saturation 99% Pulse rate 57  Abdominal: Soft. Bowel sounds are normal. She exhibits no mass. There is no tenderness.  Musculoskeletal: Normal range of motion. She exhibits no edema.  Lymphadenopathy:    She has no cervical adenopathy.  Neurological: She is alert and oriented to person, place, and time.  Skin: Skin is warm and dry. No rash noted.  Psychiatric: She has a normal mood and affect. Her behavior is normal.  Mild confusion           Assessment & Plan:   Probable resolving URI Hyponatremia. We'll discontinue furosemide. No evidence of fluid overload Mild dementia Hypertension  We'll continue supportive and symptomatic care over the weekend. The patient appears clinically stable and diuretic therapy will be discontinued. We'll recheck on Monday if any clinical deterioration

## 2013-07-11 NOTE — Telephone Encounter (Signed)
Patient Information:  Caller Name: [name]  Phone: [phone]  Patient: [name]  Gender: Female  DOB: [birth year]  Age: 79 Years  PCP: Berniece Andreas (Family Practice)  Office Follow Up:  Does the office need to follow up with this patient?: No  Instructions For The Office: N/A  RN Note:  Please review CXR and blood work preformed by physician /pulmonology last week  Symptoms  Reason For Call & Symptoms: [name] daughter is calling in about her mother. She states her mother has been sick 07/03/13.  Cough non productive and occasional low grade fever. Short of breath with exertion.  She states her husband is pulmonologist and did CXR and blood work on her mother and they were negative.  She states she is not any better.  Reviewed Health History In EMR: Yes  Reviewed Medications In EMR: Yes  Reviewed Allergies In EMR: Yes  Reviewed Surgeries / Procedures: Yes  Date of Onset of Symptoms: 07/03/2013  Treatments Tried: cough syrup, tylenol  Treatments Tried Worked: Yes  Guideline(s) Used:  Cough  Disposition Per Guideline:   See Today in Office  Reason For Disposition Reached:   Fever present > 3 days (72 hours)  Advice Given:  Call Back If:  Difficulty breathing occurs  You become worse  Patient Will Follow Care Advice:  YES  Appointment Scheduled:  07/11/2013 16:30:00 Appointment Scheduled Provider:  Eleonore Chiquito (Family Practice > 49yrs old)

## 2013-07-19 ENCOUNTER — Other Ambulatory Visit: Payer: Self-pay | Admitting: Internal Medicine

## 2013-07-21 ENCOUNTER — Other Ambulatory Visit: Payer: Self-pay | Admitting: Orthopaedic Surgery

## 2013-07-21 DIAGNOSIS — S329XXK Fracture of unspecified parts of lumbosacral spine and pelvis, subsequent encounter for fracture with nonunion: Secondary | ICD-10-CM

## 2013-07-21 DIAGNOSIS — M545 Low back pain: Secondary | ICD-10-CM

## 2013-07-30 ENCOUNTER — Ambulatory Visit
Admission: RE | Admit: 2013-07-30 | Discharge: 2013-07-30 | Disposition: A | Discharge status: Home / Self Care | Payer: Medicare Other | Source: Ambulatory Visit | Attending: Orthopaedic Surgery | Admitting: Orthopaedic Surgery

## 2013-07-30 DIAGNOSIS — S329XXK Fracture of unspecified parts of lumbosacral spine and pelvis, subsequent encounter for fracture with nonunion: Secondary | ICD-10-CM

## 2013-07-30 DIAGNOSIS — M545 Low back pain: Secondary | ICD-10-CM

## 2013-08-01 ENCOUNTER — Other Ambulatory Visit: Payer: Self-pay | Admitting: Orthopaedic Surgery

## 2013-08-01 DIAGNOSIS — IMO0002 Reserved for concepts with insufficient information to code with codable children: Secondary | ICD-10-CM

## 2013-08-06 ENCOUNTER — Other Ambulatory Visit: Payer: Self-pay | Admitting: Adult Health

## 2013-08-06 ENCOUNTER — Other Ambulatory Visit (INDEPENDENT_AMBULATORY_CARE_PROVIDER_SITE_OTHER): Payer: Medicare Other

## 2013-08-06 ENCOUNTER — Other Ambulatory Visit: Payer: Self-pay | Admitting: Orthopaedic Surgery

## 2013-08-06 ENCOUNTER — Ambulatory Visit
Admission: RE | Admit: 2013-08-06 | Discharge: 2013-08-06 | Disposition: A | Discharge status: Home / Self Care | Payer: Medicare Other | Source: Ambulatory Visit | Attending: Orthopaedic Surgery | Admitting: Orthopaedic Surgery

## 2013-08-06 DIAGNOSIS — IMO0002 Reserved for concepts with insufficient information to code with codable children: Secondary | ICD-10-CM

## 2013-08-06 DIAGNOSIS — R0602 Shortness of breath: Secondary | ICD-10-CM

## 2013-08-06 DIAGNOSIS — D649 Anemia, unspecified: Secondary | ICD-10-CM

## 2013-08-06 LAB — BASIC METABOLIC PANEL
BUN: 13 mg/dL (ref 6–23)
CO2: 29 mEq/L (ref 19–32)
Calcium: 9.6 mg/dL (ref 8.4–10.5)
Chloride: 96 mEq/L (ref 96–112)
Creatinine, Ser: 0.6 mg/dL (ref 0.4–1.2)
Glucose, Bld: 77 mg/dL (ref 70–99)

## 2013-08-06 LAB — CBC WITH DIFFERENTIAL/PLATELET
Basophils Relative: 0.4 % (ref 0.0–3.0)
Eosinophils Absolute: 0.2 10*3/uL (ref 0.0–0.7)
Eosinophils Relative: 3.1 % (ref 0.0–5.0)
Lymphocytes Relative: 11.4 % — ABNORMAL LOW (ref 12.0–46.0)
Lymphs Abs: 0.9 10*3/uL (ref 0.7–4.0)
MCHC: 33.5 g/dL (ref 30.0–36.0)
MCV: 88.2 fl (ref 78.0–100.0)
Monocytes Absolute: 0.5 10*3/uL (ref 0.1–1.0)
Neutrophils Relative %: 78.2 % — ABNORMAL HIGH (ref 43.0–77.0)
RBC: 4.21 Mil/uL (ref 3.87–5.11)
RDW: 13.8 % (ref 11.5–14.6)
WBC: 7.8 10*3/uL (ref 4.5–10.5)

## 2013-08-08 ENCOUNTER — Other Ambulatory Visit: Payer: Self-pay | Admitting: Internal Medicine

## 2013-08-09 NOTE — Telephone Encounter (Signed)
Ok to do 90 days x 3

## 2013-08-11 ENCOUNTER — Ambulatory Visit
Admission: RE | Admit: 2013-08-11 | Discharge: 2013-08-11 | Disposition: A | Discharge status: Home / Self Care | Payer: Medicare Other | Source: Ambulatory Visit | Attending: Orthopaedic Surgery | Admitting: Orthopaedic Surgery

## 2013-08-11 VITALS — BP 167/74 | HR 87 | Temp 98.2°F | Resp 21

## 2013-08-11 DIAGNOSIS — IMO0002 Reserved for concepts with insufficient information to code with codable children: Secondary | ICD-10-CM

## 2013-08-11 MED ORDER — SODIUM CHLORIDE 0.9 % IV SOLN
Freq: Once | INTRAVENOUS | Status: AC
  Administered 2013-08-11: 08:00:00 via INTRAVENOUS

## 2013-08-11 MED ORDER — KETOROLAC TROMETHAMINE 30 MG/ML IJ SOLN
30.0000 mg | Freq: Once | INTRAMUSCULAR | Status: DC
Start: 2013-08-11 — End: 2013-08-12

## 2013-08-11 MED ORDER — MIDAZOLAM HCL 2 MG/2ML IJ SOLN
1.0000 mg | INTRAMUSCULAR | Status: DC | PRN
  Administered 2013-08-11 (×4): 0.5 mg via INTRAVENOUS

## 2013-08-11 MED ORDER — FENTANYL CITRATE 0.05 MG/ML IJ SOLN
25.0000 ug | INTRAMUSCULAR | Status: DC | PRN
  Administered 2013-08-11 (×4): 25 ug via INTRAVENOUS

## 2013-08-11 MED ORDER — CEFAZOLIN SODIUM-DEXTROSE 2-3 GM-% IV SOLR
2.0000 g | Freq: Once | INTRAVENOUS | Status: AC
  Administered 2013-08-11: 2 g via INTRAVENOUS

## 2013-08-11 NOTE — Progress Notes (Signed)
Documented at wrong time

## 2013-08-11 NOTE — Progress Notes (Signed)
Pt's lungs are clear and heart sounds are regular.

## 2013-08-18 ENCOUNTER — Other Ambulatory Visit: Payer: Self-pay | Admitting: Orthopaedic Surgery

## 2013-08-18 DIAGNOSIS — S32000G Wedge compression fracture of unspecified lumbar vertebra, subsequent encounter for fracture with delayed healing: Secondary | ICD-10-CM

## 2013-08-25 ENCOUNTER — Ambulatory Visit
Admission: RE | Admit: 2013-08-25 | Discharge: 2013-08-25 | Disposition: A | Discharge status: Home / Self Care | Payer: Medicare Other | Source: Ambulatory Visit | Attending: Orthopaedic Surgery | Admitting: Orthopaedic Surgery

## 2013-08-25 DIAGNOSIS — S32000G Wedge compression fracture of unspecified lumbar vertebra, subsequent encounter for fracture with delayed healing: Secondary | ICD-10-CM

## 2013-09-16 ENCOUNTER — Ambulatory Visit: Payer: Medicare Other | Attending: Orthopaedic Surgery | Admitting: Rehabilitative and Restorative Service Providers"

## 2013-09-16 DIAGNOSIS — IMO0001 Reserved for inherently not codable concepts without codable children: Secondary | ICD-10-CM | POA: Insufficient documentation

## 2013-09-16 DIAGNOSIS — M6281 Muscle weakness (generalized): Secondary | ICD-10-CM | POA: Insufficient documentation

## 2013-09-16 DIAGNOSIS — R269 Unspecified abnormalities of gait and mobility: Secondary | ICD-10-CM | POA: Insufficient documentation

## 2013-09-19 ENCOUNTER — Ambulatory Visit: Payer: Medicare Other | Admitting: Rehabilitative and Restorative Service Providers"

## 2013-09-22 ENCOUNTER — Ambulatory Visit: Payer: Medicare Other | Admitting: Rehabilitative and Restorative Service Providers"

## 2013-09-26 ENCOUNTER — Ambulatory Visit: Payer: Medicare Other | Admitting: Rehabilitative and Restorative Service Providers"

## 2013-09-30 ENCOUNTER — Ambulatory Visit: Payer: Medicare Other | Admitting: Rehabilitative and Restorative Service Providers"

## 2013-10-03 ENCOUNTER — Ambulatory Visit: Payer: Medicare Other | Admitting: Rehabilitative and Restorative Service Providers"

## 2013-10-06 ENCOUNTER — Ambulatory Visit: Payer: Medicare Other | Attending: Orthopaedic Surgery | Admitting: Rehabilitative and Restorative Service Providers"

## 2013-10-06 DIAGNOSIS — IMO0001 Reserved for inherently not codable concepts without codable children: Secondary | ICD-10-CM | POA: Insufficient documentation

## 2013-10-06 DIAGNOSIS — R269 Unspecified abnormalities of gait and mobility: Secondary | ICD-10-CM | POA: Insufficient documentation

## 2013-10-06 DIAGNOSIS — M6281 Muscle weakness (generalized): Secondary | ICD-10-CM | POA: Insufficient documentation

## 2013-10-08 ENCOUNTER — Ambulatory Visit: Payer: Medicare Other | Admitting: Rehabilitative and Restorative Service Providers"

## 2013-10-09 ENCOUNTER — Ambulatory Visit (INDEPENDENT_AMBULATORY_CARE_PROVIDER_SITE_OTHER)
Admission: RE | Admit: 2013-10-09 | Discharge: 2013-10-09 | Disposition: A | Discharge status: Home / Self Care | Payer: Medicare Other | Source: Ambulatory Visit | Attending: Internal Medicine | Admitting: Internal Medicine

## 2013-10-09 ENCOUNTER — Other Ambulatory Visit: Payer: Self-pay | Admitting: Internal Medicine

## 2013-10-09 DIAGNOSIS — J189 Pneumonia, unspecified organism: Secondary | ICD-10-CM

## 2013-10-11 ENCOUNTER — Telehealth: Payer: Self-pay | Admitting: Internal Medicine

## 2013-10-11 NOTE — Telephone Encounter (Signed)
Documentation- Dry cough, lethargy, T 100 deg 10/10/12. CXR NAD. Fine crackles R base. Managed as viral- supportive/ rest/ fluids 2/7.

## 2013-10-13 ENCOUNTER — Ambulatory Visit: Payer: Medicare Other | Admitting: Rehabilitative and Restorative Service Providers"

## 2013-10-15 ENCOUNTER — Ambulatory Visit: Payer: Medicare Other | Admitting: Rehabilitative and Restorative Service Providers"

## 2013-10-18 ENCOUNTER — Telehealth: Payer: Self-pay | Admitting: Internal Medicine

## 2013-10-18 MED ORDER — AZITHROMYCIN 250 MG PO TABS: ORAL_TABLET | ORAL | Status: DC

## 2013-10-18 NOTE — Telephone Encounter (Signed)
Documentation. Protracted bronchitis cough, post-viral, with negative CXR. Frail, elderly, demented. Sits and dozes most of each day.  Given Zpak in effort to cover. Using Tessalon.

## 2013-10-20 ENCOUNTER — Ambulatory Visit (INDEPENDENT_AMBULATORY_CARE_PROVIDER_SITE_OTHER)
Admission: RE | Admit: 2013-10-20 | Discharge: 2013-10-20 | Disposition: A | Discharge status: Home / Self Care | Payer: Medicare Other | Source: Ambulatory Visit | Attending: Internal Medicine | Admitting: Internal Medicine

## 2013-10-20 ENCOUNTER — Ambulatory Visit (INDEPENDENT_AMBULATORY_CARE_PROVIDER_SITE_OTHER): Payer: Medicare Other | Admitting: Internal Medicine

## 2013-10-20 ENCOUNTER — Encounter: Payer: Self-pay | Admitting: Internal Medicine

## 2013-10-20 VITALS — BP 130/76 | HR 84 | Temp 98.9°F

## 2013-10-20 DIAGNOSIS — J90 Pleural effusion, not elsewhere classified: Secondary | ICD-10-CM

## 2013-10-20 DIAGNOSIS — R509 Fever, unspecified: Secondary | ICD-10-CM

## 2013-10-20 DIAGNOSIS — R059 Cough, unspecified: Secondary | ICD-10-CM

## 2013-10-20 DIAGNOSIS — R05 Cough: Secondary | ICD-10-CM

## 2013-10-20 DIAGNOSIS — J189 Pneumonia, unspecified organism: Secondary | ICD-10-CM

## 2013-10-20 LAB — CBC WITH DIFFERENTIAL/PLATELET
Basophils Absolute: 0 10*3/uL (ref 0.0–0.1)
Basophils Relative: 0 % (ref 0–1)
Eosinophils Absolute: 0.2 10*3/uL (ref 0.0–0.7)
Eosinophils Relative: 2 % (ref 0–5)
HEMATOCRIT: 36 % (ref 36.0–46.0)
HEMOGLOBIN: 12.1 g/dL (ref 12.0–15.0)
Lymphocytes Relative: 10 % — ABNORMAL LOW (ref 12–46)
Lymphs Abs: 1 10*3/uL (ref 0.7–4.0)
MCH: 28.5 pg (ref 26.0–34.0)
MCHC: 33.6 g/dL (ref 30.0–36.0)
MCV: 84.7 fL (ref 78.0–100.0)
MONO ABS: 0.8 10*3/uL (ref 0.1–1.0)
MONOS PCT: 8 % (ref 3–12)
Neutro Abs: 8.1 10*3/uL — ABNORMAL HIGH (ref 1.7–7.7)
Neutrophils Relative %: 80 % — ABNORMAL HIGH (ref 43–77)
Platelets: 501 10*3/uL — ABNORMAL HIGH (ref 150–400)
RBC: 4.25 MIL/uL (ref 3.87–5.11)
RDW: 14.7 % (ref 11.5–15.5)
WBC: 10 10*3/uL (ref 4.0–10.5)

## 2013-10-20 MED ORDER — FUROSEMIDE 20 MG PO TABS: 20.0000 mg | ORAL_TABLET | Freq: Every day | ORAL | Status: DC

## 2013-10-20 MED ORDER — CEFTRIAXONE SODIUM 1 G IJ SOLR
1.0000 g | Freq: Once | INTRAMUSCULAR | Status: AC
  Administered 2013-10-20: 1 g via INTRAMUSCULAR

## 2013-10-20 MED ORDER — LEVOFLOXACIN 500 MG PO TABS: 500.0000 mg | ORAL_TABLET | Freq: Every day | ORAL | Status: DC

## 2013-10-20 NOTE — Patient Instructions (Addendum)
Declining resp infection on z pack new effusion  Poss from  Infection .  Rocephin today and change antibiotic  Lasix rx  to hold on to   for  swelling and increase retention but at this time I think this is infection. And dont use it yet.   Call us  In next 48 hours about how we are doing. To make plan.  Will notify you  of labs when available.

## 2013-10-20 NOTE — Progress Notes (Signed)
Pre visit review using our clinic review tool, if applicable. No additional management support is needed unless otherwise documented below in the visit note.   Chief Complaint  Patient presents with  . Cough    Has recently been on a Z-pak.  Started around the 2nd of Feb.  . Fatigue    HPI: Patient comes in today for SDA for   problem evaluation. Sick  Feb  Early  Sx  And dx bronchitis    And getting worse : had neg c xray and given z pack and did better after 3-4 days and now  slow decline cough no fever but malaise  and not eating  Dec  Po intake .  Daughter has to feed her to get in nutrition  Moaning and coughing  Wetter.   Now.  Stays in bed / no fall and no uti sx   ROS: See pertinent positives and negatives per HPI. No edema/ fall /stroke event.   Past Medical History  Diagnosis Date  . Anemia   . Hyperlipidemia   . Hypertension   . Seizure disorder     poss from hponatremia when had pneumonia  . Recurrent UTI   . Urinary incontinence     mild  . Osteoporosis     Grade II spondylotistesis at l4-l5 testing in [place] 2008 and confirmed that she has osteoporosis  . Spondylolisthesis of lumbar region     Grade II spondylotistesis at l4-l5 testing in [place] 2008   . Contusion of leg 08/06/2011  . Hematoma 08/06/2011    leg near shin no bov underlying bony abnormality and no infection. should take a while to resolve observe for infection   . Chest wall pain     "off and on" (05/26/2013)  . COPD (chronic obstructive pulmonary disease)   . Pleural effusion     "a few times" (05/26/2013)  . Seizures ~ 2009    "after pneumonia; electrolytes were all messed up" (05/26/2013)  . Pneumonia 09/2002; ~ 2009;   . Arthritis   . Osteoarthritis   . Thoracic compression fracture   . Thoracic back pain   . Skin cancer 2012    leg   . Dementia     Family History  Problem Relation Age of Onset  . Mental retardation Daughter   . Hearing loss Daughter     History   Social History  .  Marital Status: Widowed    Spouse Name: N/A    Number of Children: N/A  . Years of Education: N/A   Social History Main Topics  . Smoking status: Never Smoker   . Smokeless tobacco: Never Used  . Alcohol Use: No  . Drug Use: No  . Sexual Activity: No   Other Topics Concern  . None   Social History Narrative   Moved from [place] to daughter's home   [facility] of 4   Memory test conducted in 2002 and 2007   G4P4   Goes to adult day care in day    Outpatient Encounter Prescriptions as of 10/20/2013  Medication Sig  . amLODipine (NORVASC) 2.5 MG tablet TAKE 1 TABLET BY MOUTH EVERY DAY  . aspirin 81 MG tablet Take 81 mg by mouth every other day.   . Calcium Carbonate-Vitamin D (CALCIUM + D PO) Take by mouth 2 (two) times daily.  . cycloSPORINE (RESTASIS) 0.05 % ophthalmic emulsion Place 1 drop into both eyes 2 (two) times daily.   Marland Kitchen donepezil (ARICEPT) 10 MG tablet  Take 1 tablet (10 mg total) by mouth at bedtime.  . ibandronate (BONIVA) 150 MG tablet Take 1 tablet (150 mg total) by mouth every 30 (thirty) days. Take in the morning with a full glass of water, on an empty stomach, and do not take anything else by mouth or lie down for the next 30 min. - on the 1st Sunday of each month  . methenamine (HIPREX) 1 G tablet Take 1 g by mouth daily.  . methylcellulose (ARTIFICIAL TEARS) 1 % ophthalmic solution Place 1 drop into both eyes 4 (four) times daily.  . mirabegron ER (MYRBETRIQ) 50 MG TB24 tablet Take 50 mg by mouth daily.  . Travoprost, BAK Free, (TRAVATAN) 0.004 % SOLN ophthalmic solution Place 1 drop into both eyes at bedtime.  . furosemide (LASIX) 20 MG tablet Take 1 tablet (20 mg total) by mouth daily. As directed  . levofloxacin (LEVAQUIN) 500 MG tablet Take 1 tablet (500 mg total) by mouth daily.  . [DISCONTINUED] azithromycin (ZITHROMAX) 250 MG tablet 2 on first day then one daily  . [DISCONTINUED] furosemide (LASIX) 20 MG tablet Take 1 tablet (20 mg total) by mouth daily.  .  [DISCONTINUED] lidocaine (LIDODERM) 5 % Place 1 patch onto the skin daily. Remove & Discard patch within 12 hours or as directed by MD  . [DISCONTINUED] nystatin (MYCOSTATIN) 100000 UNIT/ML suspension Take 5 mLs (500,000 Units total) by mouth 4 (four) times daily. Swish and Swollow  . [EXPIRED] cefTRIAXone (ROCEPHIN) injection 1 g     EXAM:  BP 130/76  Pulse 84  Temp(Src) 98.9 F (37.2 C) (Oral)  Cannot calculate BMI with a height equal to zero. Cant get pulse ox  Cold hands and fingers but normal seeming perfusion otherwise  GENERAL: vitals reviewed and listed above,  frial elderly but alert sitting in wc bundled will respoind alert  oriented, appears well hydrated and in no acute distress no resp distress deep cough ocass  HEENT: atraumatic, conjunctiva  clear, no obvious abnormalities on inspection of external nose and ears OP : no lesion edema or exudate  NECK: no obvious masses on inspection palpation  No jvd  LUNGS: ? Dec Basal bs no wheezes rales rhonchi and no retractions   CV: HRRR, no clubbing cyanosis No  peripheral edema nl cap refill  MS: moves all extremities without noticeable focal  Abnormality non focal exam has OA changes  Skin no actuer rashes  abd no obv tenderness (sitting) co of back hurt  See c xray inc right effusion   ASSESSMENT AND PLAN:  Discussed the following assessment and plan:  Cough - Plan: DG Chest 2 View, Culture, blood (single), Basic metabolic panel, CBC with Differential, CANCELED: Basic metabolic panel, CANCELED: CBC with Differential  Pleural effusion - Plan: Culture, blood (single), Basic metabolic panel, CBC with Differential, CANCELED: Basic metabolic panel, CANCELED: CBC with Differential  Pneumonia - probable - Plan: Culture, blood (single), Basic metabolic panel, CBC with Differential, cefTRIAXone (ROCEPHIN) injection 1 g, CANCELED: Basic metabolic panel, CANCELED: CBC with Differential  Fever, unspecified - Plan: DG Chest 2 View,  Culture, blood (single), Basic metabolic panel, CBC with Differential, CANCELED: Basic metabolic panel, CANCELED: CBC with Differential acts more like infection  Than fluid overload  . Hold on to lasix rx and await labs and progress.  -Patient advised to return or notify health care team  if symptoms worsen or persist or new concerns arise.  Patient Instructions  Declining resp infection on z pack new effusion  Poss from  Infection .  Rocephin today and change antibiotic  Lasix rx  to hold on to   for  swelling and increase retention but at this time I think this is infection. And dont use it yet.   Call us  In next 48 hours about how we are doing. To make plan.  Will notify you  of labs when available.   Standley Brooking. Panosh M.D.

## 2013-10-21 LAB — BASIC METABOLIC PANEL
BUN: 13 mg/dL (ref 6–23)
CALCIUM: 8.9 mg/dL (ref 8.4–10.5)
CO2: 27 mEq/L (ref 19–32)
Chloride: 98 mEq/L (ref 96–112)
Creat: 0.57 mg/dL (ref 0.50–1.10)
Glucose, Bld: 87 mg/dL (ref 70–99)
Potassium: 4.1 mEq/L (ref 3.5–5.3)
SODIUM: 135 meq/L (ref 135–145)

## 2013-10-24 ENCOUNTER — Encounter: Payer: Self-pay | Admitting: Internal Medicine

## 2013-10-24 ENCOUNTER — Telehealth: Payer: Self-pay | Admitting: Internal Medicine

## 2013-10-24 NOTE — Telephone Encounter (Signed)
Pt daughter notified of results by telephone.

## 2013-10-24 NOTE — Telephone Encounter (Signed)
Antibiotic can cause drowsiness somewhat but  Would continue . Will sen her a message in my chart   Give [place] an update next week

## 2013-10-24 NOTE — Telephone Encounter (Signed)
Daughter calling in to update Dr. Mamie Nick, cough is better, daughter thinks pt is a little "perkier", pt still complaining she wants to sleep, even after a nap and still isn't eating, daughter is having to force feed her, but is still not eating a lot. Daughter is not sure what can be done but at least wanted to give an update. [phone], [place] young (daughter).

## 2013-10-26 LAB — CULTURE, BLOOD (SINGLE): ORGANISM ID, BACTERIA: NO GROWTH

## 2013-11-04 ENCOUNTER — Ambulatory Visit: Payer: Medicare Other | Attending: Orthopaedic Surgery | Admitting: Rehabilitative and Restorative Service Providers"

## 2013-11-04 DIAGNOSIS — IMO0001 Reserved for inherently not codable concepts without codable children: Secondary | ICD-10-CM | POA: Insufficient documentation

## 2013-11-04 DIAGNOSIS — R269 Unspecified abnormalities of gait and mobility: Secondary | ICD-10-CM | POA: Insufficient documentation

## 2013-11-04 DIAGNOSIS — M6281 Muscle weakness (generalized): Secondary | ICD-10-CM | POA: Insufficient documentation

## 2013-11-07 ENCOUNTER — Ambulatory Visit: Payer: Medicare Other | Admitting: Rehabilitative and Restorative Service Providers"

## 2013-11-11 ENCOUNTER — Ambulatory Visit: Payer: Medicare Other | Admitting: Rehabilitative and Restorative Service Providers"

## 2013-11-14 ENCOUNTER — Ambulatory Visit: Payer: Medicare Other | Admitting: Rehabilitative and Restorative Service Providers"

## 2013-11-18 ENCOUNTER — Ambulatory Visit: Payer: Medicare Other | Admitting: Rehabilitative and Restorative Service Providers"

## 2013-11-20 ENCOUNTER — Ambulatory Visit: Payer: Medicare Other | Admitting: Rehabilitative and Restorative Service Providers"

## 2013-11-25 ENCOUNTER — Ambulatory Visit: Payer: Medicare Other | Admitting: Physical Therapy

## 2013-11-28 ENCOUNTER — Ambulatory Visit: Payer: Medicare Other | Admitting: Rehabilitative and Restorative Service Providers"

## 2013-12-01 ENCOUNTER — Ambulatory Visit: Payer: Medicare Other | Admitting: Rehabilitative and Restorative Service Providers"

## 2013-12-01 ENCOUNTER — Other Ambulatory Visit: Payer: Self-pay | Admitting: Internal Medicine

## 2013-12-02 ENCOUNTER — Ambulatory Visit: Payer: Medicare Other | Admitting: Rehabilitative and Restorative Service Providers"

## 2013-12-02 NOTE — Telephone Encounter (Signed)
Not sure how much to give

## 2013-12-03 NOTE — Telephone Encounter (Signed)
Ok to refill x 1 year 

## 2013-12-08 ENCOUNTER — Ambulatory Visit: Payer: Medicare Other | Attending: Orthopaedic Surgery | Admitting: Rehabilitative and Restorative Service Providers"

## 2013-12-08 DIAGNOSIS — R269 Unspecified abnormalities of gait and mobility: Secondary | ICD-10-CM | POA: Insufficient documentation

## 2013-12-08 DIAGNOSIS — IMO0001 Reserved for inherently not codable concepts without codable children: Secondary | ICD-10-CM | POA: Insufficient documentation

## 2013-12-08 DIAGNOSIS — M6281 Muscle weakness (generalized): Secondary | ICD-10-CM | POA: Insufficient documentation

## 2013-12-10 ENCOUNTER — Ambulatory Visit: Payer: Medicare Other | Admitting: Rehabilitative and Restorative Service Providers"

## 2013-12-16 ENCOUNTER — Ambulatory Visit: Payer: Medicare Other | Admitting: Rehabilitative and Restorative Service Providers"

## 2013-12-18 ENCOUNTER — Ambulatory Visit: Payer: Medicare Other | Admitting: Rehabilitative and Restorative Service Providers"

## 2013-12-23 ENCOUNTER — Ambulatory Visit: Payer: Medicare Other | Admitting: Rehabilitative and Restorative Service Providers"

## 2013-12-25 ENCOUNTER — Ambulatory Visit: Payer: Medicare Other | Admitting: Rehabilitative and Restorative Service Providers"

## 2013-12-28 ENCOUNTER — Other Ambulatory Visit: Payer: Self-pay | Admitting: Internal Medicine

## 2013-12-30 ENCOUNTER — Ambulatory Visit: Payer: Medicare Other | Admitting: Rehabilitative and Restorative Service Providers"

## 2014-01-01 ENCOUNTER — Ambulatory Visit: Payer: Medicare Other | Admitting: Rehabilitative and Restorative Service Providers"

## 2014-01-01 DIAGNOSIS — M6281 Muscle weakness (generalized): Secondary | ICD-10-CM | POA: Diagnosis not present

## 2014-01-01 DIAGNOSIS — R269 Unspecified abnormalities of gait and mobility: Secondary | ICD-10-CM | POA: Insufficient documentation

## 2014-01-01 DIAGNOSIS — IMO0001 Reserved for inherently not codable concepts without codable children: Secondary | ICD-10-CM | POA: Insufficient documentation

## 2014-01-27 ENCOUNTER — Inpatient Hospital Stay (HOSPITAL_COMMUNITY)
Admission: EM | Admit: 2014-01-27 | Discharge: 2014-02-03 | DRG: 177 | Disposition: A | Discharge status: Home / Self Care | Payer: Medicare Other | Attending: Internal Medicine | Admitting: Internal Medicine

## 2014-01-27 ENCOUNTER — Emergency Department (HOSPITAL_COMMUNITY): Payer: Medicare Other

## 2014-01-27 ENCOUNTER — Encounter (HOSPITAL_COMMUNITY): Payer: Self-pay | Admitting: Emergency Medicine

## 2014-01-27 DIAGNOSIS — R32 Unspecified urinary incontinence: Secondary | ICD-10-CM

## 2014-01-27 DIAGNOSIS — J310 Chronic rhinitis: Secondary | ICD-10-CM

## 2014-01-27 DIAGNOSIS — R0602 Shortness of breath: Secondary | ICD-10-CM

## 2014-01-27 DIAGNOSIS — R7302 Impaired glucose tolerance (oral): Secondary | ICD-10-CM

## 2014-01-27 DIAGNOSIS — R1311 Dysphagia, oral phase: Secondary | ICD-10-CM | POA: Diagnosis present

## 2014-01-27 DIAGNOSIS — E785 Hyperlipidemia, unspecified: Secondary | ICD-10-CM | POA: Diagnosis present

## 2014-01-27 DIAGNOSIS — R05 Cough: Secondary | ICD-10-CM

## 2014-01-27 DIAGNOSIS — W19XXXA Unspecified fall, initial encounter: Secondary | ICD-10-CM

## 2014-01-27 DIAGNOSIS — H04129 Dry eye syndrome of unspecified lacrimal gland: Secondary | ICD-10-CM

## 2014-01-27 DIAGNOSIS — Z66 Do not resuscitate: Secondary | ICD-10-CM | POA: Diagnosis present

## 2014-01-27 DIAGNOSIS — R918 Other nonspecific abnormal finding of lung field: Secondary | ICD-10-CM | POA: Diagnosis present

## 2014-01-27 DIAGNOSIS — E871 Hypo-osmolality and hyponatremia: Secondary | ICD-10-CM | POA: Diagnosis present

## 2014-01-27 DIAGNOSIS — D649 Anemia, unspecified: Secondary | ICD-10-CM

## 2014-01-27 DIAGNOSIS — F70 Mild intellectual disabilities: Secondary | ICD-10-CM | POA: Diagnosis present

## 2014-01-27 DIAGNOSIS — Z8709 Personal history of other diseases of the respiratory system: Secondary | ICD-10-CM

## 2014-01-27 DIAGNOSIS — D72829 Elevated white blood cell count, unspecified: Secondary | ICD-10-CM

## 2014-01-27 DIAGNOSIS — J189 Pneumonia, unspecified organism: Secondary | ICD-10-CM | POA: Diagnosis present

## 2014-01-27 DIAGNOSIS — F0280 Dementia in other diseases classified elsewhere without behavioral disturbance: Secondary | ICD-10-CM | POA: Diagnosis present

## 2014-01-27 DIAGNOSIS — Z87440 Personal history of urinary (tract) infections: Secondary | ICD-10-CM

## 2014-01-27 DIAGNOSIS — M4316 Spondylolisthesis, lumbar region: Secondary | ICD-10-CM

## 2014-01-27 DIAGNOSIS — L259 Unspecified contact dermatitis, unspecified cause: Secondary | ICD-10-CM

## 2014-01-27 DIAGNOSIS — M129 Arthropathy, unspecified: Secondary | ICD-10-CM

## 2014-01-27 DIAGNOSIS — R569 Unspecified convulsions: Secondary | ICD-10-CM

## 2014-01-27 DIAGNOSIS — R030 Elevated blood-pressure reading, without diagnosis of hypertension: Secondary | ICD-10-CM

## 2014-01-27 DIAGNOSIS — Z85828 Personal history of other malignant neoplasm of skin: Secondary | ICD-10-CM

## 2014-01-27 DIAGNOSIS — N39498 Other specified urinary incontinence: Secondary | ICD-10-CM

## 2014-01-27 DIAGNOSIS — N390 Urinary tract infection, site not specified: Secondary | ICD-10-CM

## 2014-01-27 DIAGNOSIS — R5381 Other malaise: Secondary | ICD-10-CM

## 2014-01-27 DIAGNOSIS — J9600 Acute respiratory failure, unspecified whether with hypoxia or hypercapnia: Secondary | ICD-10-CM | POA: Diagnosis not present

## 2014-01-27 DIAGNOSIS — I509 Heart failure, unspecified: Secondary | ICD-10-CM | POA: Diagnosis present

## 2014-01-27 DIAGNOSIS — R0789 Other chest pain: Secondary | ICD-10-CM

## 2014-01-27 DIAGNOSIS — Z87448 Personal history of other diseases of urinary system: Secondary | ICD-10-CM

## 2014-01-27 DIAGNOSIS — Z9849 Cataract extraction status, unspecified eye: Secondary | ICD-10-CM

## 2014-01-27 DIAGNOSIS — Z7982 Long term (current) use of aspirin: Secondary | ICD-10-CM

## 2014-01-27 DIAGNOSIS — G40909 Epilepsy, unspecified, not intractable, without status epilepticus: Secondary | ICD-10-CM | POA: Diagnosis present

## 2014-01-27 DIAGNOSIS — J811 Chronic pulmonary edema: Secondary | ICD-10-CM

## 2014-01-27 DIAGNOSIS — R7309 Other abnormal glucose: Secondary | ICD-10-CM | POA: Diagnosis present

## 2014-01-27 DIAGNOSIS — J4489 Other specified chronic obstructive pulmonary disease: Secondary | ICD-10-CM | POA: Diagnosis present

## 2014-01-27 DIAGNOSIS — F068 Other specified mental disorders due to known physiological condition: Secondary | ICD-10-CM | POA: Diagnosis present

## 2014-01-27 DIAGNOSIS — S8010XA Contusion of unspecified lower leg, initial encounter: Secondary | ICD-10-CM

## 2014-01-27 DIAGNOSIS — F0390 Unspecified dementia without behavioral disturbance: Secondary | ICD-10-CM | POA: Diagnosis present

## 2014-01-27 DIAGNOSIS — R079 Chest pain, unspecified: Secondary | ICD-10-CM

## 2014-01-27 DIAGNOSIS — T148XXA Other injury of unspecified body region, initial encounter: Secondary | ICD-10-CM

## 2014-01-27 DIAGNOSIS — R2681 Unsteadiness on feet: Secondary | ICD-10-CM

## 2014-01-27 DIAGNOSIS — H04123 Dry eye syndrome of bilateral lacrimal glands: Secondary | ICD-10-CM

## 2014-01-27 DIAGNOSIS — Z79899 Other long term (current) drug therapy: Secondary | ICD-10-CM

## 2014-01-27 DIAGNOSIS — J449 Chronic obstructive pulmonary disease, unspecified: Secondary | ICD-10-CM | POA: Diagnosis present

## 2014-01-27 DIAGNOSIS — K224 Dyskinesia of esophagus: Secondary | ICD-10-CM

## 2014-01-27 DIAGNOSIS — R531 Weakness: Secondary | ICD-10-CM

## 2014-01-27 DIAGNOSIS — G479 Sleep disorder, unspecified: Secondary | ICD-10-CM

## 2014-01-27 DIAGNOSIS — D485 Neoplasm of uncertain behavior of skin: Secondary | ICD-10-CM

## 2014-01-27 DIAGNOSIS — R509 Fever, unspecified: Secondary | ICD-10-CM | POA: Diagnosis present

## 2014-01-27 DIAGNOSIS — H409 Unspecified glaucoma: Secondary | ICD-10-CM

## 2014-01-27 DIAGNOSIS — I10 Essential (primary) hypertension: Secondary | ICD-10-CM | POA: Diagnosis present

## 2014-01-27 DIAGNOSIS — Z862 Personal history of diseases of the blood and blood-forming organs and certain disorders involving the immune mechanism: Secondary | ICD-10-CM

## 2014-01-27 DIAGNOSIS — M810 Age-related osteoporosis without current pathological fracture: Secondary | ICD-10-CM | POA: Diagnosis present

## 2014-01-27 DIAGNOSIS — E876 Hypokalemia: Secondary | ICD-10-CM | POA: Diagnosis present

## 2014-01-27 DIAGNOSIS — R5383 Other fatigue: Secondary | ICD-10-CM

## 2014-01-27 DIAGNOSIS — J690 Pneumonitis due to inhalation of food and vomit: Principal | ICD-10-CM | POA: Diagnosis present

## 2014-01-27 DIAGNOSIS — M549 Dorsalgia, unspecified: Secondary | ICD-10-CM

## 2014-01-27 DIAGNOSIS — J90 Pleural effusion, not elsewhere classified: Secondary | ICD-10-CM | POA: Diagnosis present

## 2014-01-27 DIAGNOSIS — M353 Polymyalgia rheumatica: Secondary | ICD-10-CM

## 2014-01-27 DIAGNOSIS — R059 Cough, unspecified: Secondary | ICD-10-CM

## 2014-01-27 LAB — CBC WITH DIFFERENTIAL/PLATELET
Basophils Absolute: 0 10*3/uL (ref 0.0–0.1)
Basophils Relative: 0 % (ref 0–1)
Eosinophils Absolute: 0 10*3/uL (ref 0.0–0.7)
Eosinophils Relative: 0 % (ref 0–5)
HCT: 39 % (ref 36.0–46.0)
Hemoglobin: 13.6 g/dL (ref 12.0–15.0)
Lymphocytes Relative: 3 % — ABNORMAL LOW (ref 12–46)
Lymphs Abs: 0.5 10*3/uL — ABNORMAL LOW (ref 0.7–4.0)
MCH: 30.9 pg (ref 26.0–34.0)
MCHC: 34.9 g/dL (ref 30.0–36.0)
MCV: 88.6 fL (ref 78.0–100.0)
Monocytes Absolute: 0.7 10*3/uL (ref 0.1–1.0)
Monocytes Relative: 4 % (ref 3–12)
Neutro Abs: 14.4 10*3/uL — ABNORMAL HIGH (ref 1.7–7.7)
Neutrophils Relative %: 93 % — ABNORMAL HIGH (ref 43–77)
Platelets: 214 10*3/uL (ref 150–400)
RBC: 4.4 MIL/uL (ref 3.87–5.11)
RDW: 13.2 % (ref 11.5–15.5)
WBC: 15.5 10*3/uL — ABNORMAL HIGH (ref 4.0–10.5)

## 2014-01-27 LAB — COMPREHENSIVE METABOLIC PANEL WITH GFR
ALT: 12 U/L (ref 0–35)
AST: 27 U/L (ref 0–37)
Albumin: 3.4 g/dL — ABNORMAL LOW (ref 3.5–5.2)
Alkaline Phosphatase: 90 U/L (ref 39–117)
BUN: 19 mg/dL (ref 6–23)
CO2: 24 meq/L (ref 19–32)
Calcium: 9.1 mg/dL (ref 8.4–10.5)
Chloride: 92 meq/L — ABNORMAL LOW (ref 96–112)
Creatinine, Ser: 0.67 mg/dL (ref 0.50–1.10)
GFR calc Af Amer: 85 mL/min — ABNORMAL LOW
GFR calc non Af Amer: 73 mL/min — ABNORMAL LOW
Glucose, Bld: 250 mg/dL — ABNORMAL HIGH (ref 70–99)
Potassium: 3.9 meq/L (ref 3.7–5.3)
Sodium: 130 meq/L — ABNORMAL LOW (ref 137–147)
Total Bilirubin: 0.2 mg/dL — ABNORMAL LOW (ref 0.3–1.2)
Total Protein: 8.3 g/dL (ref 6.0–8.3)

## 2014-01-27 LAB — URINALYSIS, ROUTINE W REFLEX MICROSCOPIC
Bilirubin Urine: NEGATIVE
Glucose, UA: 100 mg/dL — AB
Ketones, ur: 15 mg/dL — AB
Leukocytes, UA: NEGATIVE
Nitrite: NEGATIVE
Protein, ur: 30 mg/dL — AB
Specific Gravity, Urine: 1.017 (ref 1.005–1.030)
Urobilinogen, UA: 0.2 mg/dL (ref 0.0–1.0)
pH: 6 (ref 5.0–8.0)

## 2014-01-27 LAB — URINE MICROSCOPIC-ADD ON

## 2014-01-27 LAB — I-STAT CG4 LACTIC ACID, ED: LACTIC ACID, VENOUS: 2.07 mmol/L (ref 0.5–2.2)

## 2014-01-27 MED ORDER — SODIUM CHLORIDE 0.9 % IV SOLN
1000.0000 mL | Freq: Once | INTRAVENOUS | Status: AC
  Administered 2014-01-27: 1000 mL via INTRAVENOUS

## 2014-01-27 MED ORDER — DEXTROSE 5 % IV SOLN
500.0000 mg | INTRAVENOUS | Status: DC
  Administered 2014-01-27 – 2014-01-31 (×5): 500 mg via INTRAVENOUS
  Filled 2014-01-27 (×5): qty 500

## 2014-01-27 MED ORDER — IBUPROFEN 400 MG PO TABS
400.0000 mg | ORAL_TABLET | Freq: Once | ORAL | Status: AC
  Administered 2014-01-27: 400 mg via ORAL
  Filled 2014-01-27: qty 1

## 2014-01-27 MED ORDER — SODIUM CHLORIDE 0.9 % IV SOLN
1000.0000 mL | INTRAVENOUS | Status: DC
  Administered 2014-01-27: 1000 mL via INTRAVENOUS

## 2014-01-27 MED ORDER — DEXTROSE 5 % IV SOLN
1.0000 g | INTRAVENOUS | Status: DC
  Administered 2014-01-27 – 2014-01-28 (×2): 1 g via INTRAVENOUS
  Filled 2014-01-27 (×3): qty 10

## 2014-01-27 MED ORDER — ACETAMINOPHEN 650 MG RE SUPP
650.0000 mg | RECTAL | Status: DC | PRN
  Administered 2014-01-27: 650 mg via RECTAL
  Filled 2014-01-27: qty 1

## 2014-01-27 NOTE — ED Notes (Signed)
Attempted to give report 

## 2014-01-27 NOTE — ED Notes (Signed)
EMS reports the pt lives with family and has two care takers that comes twice throughout the day, EMS reports the caregiver reports the pt started having right sided deficits at 930am, EMS reports the caregiver states she had just got the pt out of the shower and she noticed the pt was having difficulties with her right arm getting her dressed and at lunch time the pt was eating with her left hand when she normally eats with her right hand. Caregiver was going to take pt to the hospital this evening but they were unable to help the pt stand and get into the vehicle, pt is usually able to ambulate with assistance. EMS reports pt's HR 110 and the pt has a cough. Pt febrile: 102.6 upon arrival to department. Pt's 02 sats 92% on RA, pt placed on 4L 02 by EMS.

## 2014-01-27 NOTE — ED Notes (Signed)
MD aware of pt's HR.

## 2014-01-27 NOTE — ED Provider Notes (Signed)
CSN: [id number]     Arrival date & time 01/27/14  1926 History   First MD Initiated Contact with Patient 01/27/14 1944     Chief Complaint  Patient presents with  . Weakness  . Cough     (Consider location/radiation/quality/duration/timing/severity/associated sxs/prior Treatment) HPI 80 year old female presents with worsening cough, shortness of breath, and right arm weakness. The daughter states she last saw her mom normal yesterday. She notes that the caregiver said she is having trouble using her right arm and was shaking. She seemed to not be able to have as much strength is normal. The patient has a history of multiple pneumonias and UTIs. She is having a worsening productive cough. Do not know of any fevers. Patient does not seem to be acting as alert as normal.  Past Medical History  Diagnosis Date  . Anemia   . Hyperlipidemia   . Hypertension   . Seizure disorder     poss from hponatremia when had pneumonia  . Recurrent UTI   . Urinary incontinence     mild  . Osteoporosis     Grade II spondylotistesis at l4-l5 testing in [place] 2008 and confirmed that she has osteoporosis  . Spondylolisthesis of lumbar region     Grade II spondylotistesis at l4-l5 testing in [place] 2008   . Contusion of leg 08/06/2011  . Hematoma 08/06/2011    leg near shin no bov underlying bony abnormality and no infection. should take a while to resolve observe for infection   . Chest wall pain     "off and on" (05/26/2013)  . COPD (chronic obstructive pulmonary disease)   . Pleural effusion     "a few times" (05/26/2013)  . Seizures ~ 2009    "after pneumonia; electrolytes were all messed up" (05/26/2013)  . Pneumonia 09/2002; ~ 2009;   . Arthritis   . Osteoarthritis   . Thoracic compression fracture   . Thoracic back pain   . Skin cancer 2012    leg   . Dementia    Past Surgical History  Procedure Laterality Date  . Abdominal hysterectomy    . Lumbar disc surgery    . Breast lumpectomy  Bilateral      had fibrofatty tissue  . Cataract extraction w/ intraocular lens implant Left   . Glaucoma surgery Left   . Eye surgery    . Skin cancer excision  2012   Family History  Problem Relation Age of Onset  . Mental retardation Daughter   . Hearing loss Daughter    History  Substance Use Topics  . Smoking status: Never Smoker   . Smokeless tobacco: Never Used  . Alcohol Use: No   OB History   Grav Para Term Preterm Abortions TAB SAB Ect Mult Living   4 4             Review of Systems  Unable to perform ROS: Mental status change      Allergies  Betimol; Codeine; Gabapentin; and Sulfonamide derivatives  Home Medications   Prior to Admission medications   Medication Sig Start Date End Date Taking? Authorizing Provider  amLODipine (NORVASC) 2.5 MG tablet TAKE 1 TABLET BY MOUTH EVERY DAY    Burnis Medin, MD  aspirin 81 MG tablet Take 81 mg by mouth every other day.     Historical Provider, MD  Calcium Carbonate-Vitamin D (CALCIUM + D PO) Take by mouth 2 (two) times daily.    Historical Provider, MD  cycloSPORINE (  RESTASIS) 0.05 % ophthalmic emulsion Place 1 drop into both eyes 2 (two) times daily.     Historical Provider, MD  donepezil (ARICEPT) 10 MG tablet TAKE 1 TABLET AT BEDTIME    Burnis Medin, MD  furosemide (LASIX) 20 MG tablet Take 1 tablet (20 mg total) by mouth daily. As directed 10/20/13   Burnis Medin, MD  ibandronate (BONIVA) 150 MG tablet Take 1 tablet (150 mg total) by mouth every 30 (thirty) days.    Burnis Medin, MD  levofloxacin (LEVAQUIN) 500 MG tablet Take 1 tablet (500 mg total) by mouth daily. 10/20/13   Burnis Medin, MD  methenamine (HIPREX) 1 G tablet Take 1 g by mouth daily. 05/20/13   Historical Provider, MD  methylcellulose (ARTIFICIAL TEARS) 1 % ophthalmic solution Place 1 drop into both eyes 4 (four) times daily. 12/19/12   Burnis Medin, MD  mirabegron ER (MYRBETRIQ) 50 MG TB24 tablet Take 50 mg by mouth daily.    Historical  Provider, MD  Travoprost, BAK Free, (TRAVATAN) 0.004 % SOLN ophthalmic solution Place 1 drop into both eyes at bedtime.    Historical Provider, MD   BP 122/49  Pulse 99  Temp(Src) 103.5 F (39.7 C) (Rectal)  Resp 20  SpO2 94% Physical Exam  Nursing note and vitals reviewed. Constitutional: She appears well-developed and well-nourished.  HENT:  Head: Normocephalic and atraumatic.  Right Ear: External ear normal.  Left Ear: External ear normal.  Nose: Nose normal.  Eyes: Right eye exhibits no discharge. Left eye exhibits no discharge.  Cardiovascular: Normal rate, regular rhythm and normal heart sounds.   Pulmonary/Chest: Effort normal. She has wheezes. She has rhonchi.  Abdominal: Soft. There is no tenderness.  Neurological: She is alert. She is disoriented.  Normal strength in all 4 extremities  Skin: Skin is warm and dry.    ED Course  Procedures (including critical care time) Labs Review Labs Reviewed  CBC WITH DIFFERENTIAL - Abnormal; Notable for the following:    WBC 15.5 (*)    Neutrophils Relative % 93 (*)    Neutro Abs 14.4 (*)    Lymphocytes Relative 3 (*)    Lymphs Abs 0.5 (*)    All other components within normal limits  COMPREHENSIVE METABOLIC PANEL - Abnormal; Notable for the following:    Sodium 130 (*)    Chloride 92 (*)    Glucose, Bld 250 (*)    Albumin 3.4 (*)    Total Bilirubin 0.2 (*)    GFR calc non Af Amer 73 (*)    GFR calc Af Amer 85 (*)    All other components within normal limits  URINALYSIS, ROUTINE W REFLEX MICROSCOPIC - Abnormal; Notable for the following:    Glucose, UA 100 (*)    Hgb urine dipstick MODERATE (*)    Ketones, ur 15 (*)    Protein, ur 30 (*)    All other components within normal limits  CULTURE, BLOOD (ROUTINE X 2)  CULTURE, BLOOD (ROUTINE X 2)  URINE CULTURE  URINE MICROSCOPIC-ADD ON  I-STAT CG4 LACTIC ACID, ED    Imaging Review Ct Head Wo Contrast  01/27/2014   CLINICAL DATA:  Transient right arm weakness.   EXAM: CT HEAD WITHOUT CONTRAST  TECHNIQUE: Contiguous axial images were obtained from the base of the skull through the vertex without intravenous contrast.  COMPARISON:  03/24/2012  FINDINGS: Small, remote infarcts are again seen in the left centrum semiovale, left thalamus, and right cerebellum,  unchanged. Periventricular white matter hypodensities do not appear significantly changed and are compatible with moderate chronic small vessel ischemic disease. Cerebral atrophy is unchanged and likely within normal limits for age. There is no evidence of acute cortical infarct, intracranial hemorrhage, mass, midline shift, or extra-axial fluid collection.  Prior bilateral cataract surgery is noted. There is a small to moderate right mastoid effusion. Trace left sphenoid sinus mucosal thickening is noted.  IMPRESSION: 1. No evidence of acute intracranial abnormality. 2. Stable appearance of chronic ischemic changes.   Electronically Signed   By: Logan Bores   On: 01/27/2014 21:15   Dg Chest Port 1 View  (if Code Sepsis Called)  01/27/2014   CLINICAL DATA:  Cough.  EXAM: PORTABLE CHEST - 1 VIEW  COMPARISON:  10/20/2013.  FINDINGS: Mediastinum and hilar structures normal. Interim clearing of right lower lobe atelectasis/ infiltrate and right pleural effusion. Low lung volumes. Chronic interstitial lung disease. No pneumothorax. No acute osseous abnormality. Prior vertebroplasty.  IMPRESSION: Interim clearing of right lower lobe infiltrate and right pleural effusion from chest x-ray of 10/20/2013. No acute abnormality identified.   Electronically Signed   By: Marcello Moores  Register   On: 01/27/2014 20:30     EKG Interpretation   Date/Time:  Tuesday Jan 27 2014 19:36:56 EDT Ventricular Rate:  106 PR Interval:  193 QRS Duration: 91 QT Interval:  387 QTC Calculation: 514 R Axis:   -23 Text Interpretation:  Sinus tachycardia Left ventricular hypertrophy  Nonspecific T abnormalities, inferior leads Prolonged QT  interval No  significant change since last tracing Confirmed by Addison Freimuth  MD, Sadonna Kotara  ([id number]) on 01/27/2014 7:54:28 PM      MDM   1. Fever 2. CAP  80 year old female with worsening productive cough, is febrile here, and meets criteria for sepsis. Chest x-ray is negative, but there is concern for pneumonia and thus she was given Rocephin and azithro. Her heart rate improved with fever control and fluids. No hypotension. She appears stable for admission to the floor. I discussed the case with the hospitalist, who will admit the patient.    Ephraim Hamburger, MD 01/27/14 [phone]

## 2014-01-27 NOTE — H&P (Signed)
PCP:   Holly Dawson, MD   Chief Complaint:  fever  HPI: 80 yo female h/o mild dementia, htn, hld, copd lives at home with her daughter normally walks with assistance with walker brought in by family for high fevers, cough and sob since yesterday.  No n/v/d.  No rashes.  She is very weak and could not get up out of bed today.  More confused than normal.  History obtained from ed staff, pt is pleasantly demented and no family in room at this time.  Pt is resting comfortably and awakes easily by voice, denies any pain.  Review of Systems:  Unobtainable from pt  Past Medical History: Past Medical History  Diagnosis Date  . Anemia   . Hyperlipidemia   . Hypertension   . Seizure disorder     poss from hponatremia when had pneumonia  . Recurrent UTI   . Urinary incontinence     mild  . Osteoporosis     Grade II spondylotistesis at l4-l5 testing in [place] 2008 and confirmed that she has osteoporosis  . Spondylolisthesis of lumbar region     Grade II spondylotistesis at l4-l5 testing in [place] 2008   . Contusion of leg 08/06/2011  . Hematoma 08/06/2011    leg near shin no bov underlying bony abnormality and no infection. should take a while to resolve observe for infection   . Chest wall pain     "off and on" (05/26/2013)  . COPD (chronic obstructive pulmonary disease)   . Pleural effusion     "a few times" (05/26/2013)  . Seizures ~ 2009    "after pneumonia; electrolytes were all messed up" (05/26/2013)  . Pneumonia 09/2002; ~ 2009;   . Arthritis   . Osteoarthritis   . Thoracic compression fracture   . Thoracic back pain   . Skin cancer 2012    leg   . Dementia    Past Surgical History  Procedure Laterality Date  . Abdominal hysterectomy    . Lumbar disc surgery    . Breast lumpectomy Bilateral      had fibrofatty tissue  . Cataract extraction w/ intraocular lens implant Left   . Glaucoma surgery Left   . Eye surgery    . Skin cancer excision  2012     Medications: Prior to Admission medications   Medication Sig Start Date End Date Taking? Authorizing Provider  acetaminophen (TYLENOL) 325 MG tablet Take 650 mg by mouth 2 (two) times daily as needed (pain).   Yes Historical Provider, MD  amLODipine (NORVASC) 2.5 MG tablet Take 2.5 mg by mouth daily.   Yes Historical Provider, MD  aspirin EC 81 MG tablet Take 81 mg by mouth every other day.   Yes Historical Provider, MD  Calcium Carb-Cholecalciferol 500-600 MG-UNIT CHEW Chew 1 tablet by mouth 2 (two) times daily.   Yes Historical Provider, MD  cycloSPORINE (RESTASIS) 0.05 % ophthalmic emulsion Place 1 drop into both eyes 2 (two) times daily.    Yes Historical Provider, MD  donepezil (ARICEPT) 10 MG tablet Take 10 mg by mouth daily with supper.   Yes Historical Provider, MD  HYDROcodone-homatropine (HYCODAN) 5-1.5 MG/5ML syrup Take 2.5 mLs by mouth once.   Yes Historical Provider, MD  ibandronate (BONIVA) 150 MG tablet Take 150 mg by mouth every 30 (thirty) days. Take in the morning with a full glass of water, on an empty stomach, and do not take anything else by mouth or lie down for the next 30  min.; on the 1st Sunday of each month   Yes Historical Provider, MD  methenamine (HIPREX) 1 G tablet Take 1 g by mouth daily with supper.  05/20/13  Yes Historical Provider, MD  mirabegron ER (MYRBETRIQ) 50 MG TB24 tablet Take 50 mg by mouth daily.   Yes Historical Provider, MD  moxifloxacin (VIGAMOX) 0.5 % ophthalmic solution Place 1 drop into the left eye 2 (two) times daily. Pt has contact in eye to prevent more scarring between lid and eye - eye drops are used as long as contact is in place   Yes Historical Provider, MD  [place] 1 application topically daily. Anti-fungal powder (apply to skin fold on abdomen after bath)   Yes Historical Provider, MD  Polyethyl Glycol-Propyl Glycol (SYSTANE PRESERVATIVE FREE) 0.4-0.3 % SOLN Place 1 drop into both eyes as needed (dry eyes).    Yes Historical Provider, MD  PRESCRIPTION MEDICATION Place 1 drop into both eyes 4 (four) times daily. Blood Serum Tears compounded at [facility]   Yes Historical Provider, MD  [facility] 1 drop into both eyes 4 (four) times daily. acetylcysteine eye drops compounded at [facility] (use with hot compress)   Yes Historical Provider, MD  Travoprost, BAK Free, (TRAVATAN) 0.004 % SOLN ophthalmic solution Place 1 drop into both eyes at bedtime.   Yes Historical Provider, MD    Allergies:   Allergies  Allergen Reactions  . Betimol [Timolol] Swelling  . Other Swelling    Patient is also allergic to several glaucoma eye drops - causes swelling  . Codeine Nausea And Vomiting  . Gabapentin Other (See Comments)     tremors  . Sulfonamide Derivatives Hives    Social History:  reports that she has never smoked. She has never used smokeless tobacco. She reports that she does not drink alcohol or use illicit drugs.  Family History: Family History  Problem Relation Age of Onset  . Mental retardation Daughter   . Hearing loss Daughter     Physical Exam: Filed Vitals:   01/27/14 2230 01/27/14 2245 01/27/14 2256 01/27/14 2300  BP: 95/57  102/84 102/84  Pulse: 93 84  86  Temp:      TempSrc:      Resp: 22 26  18   SpO2: 99% 97%  96%   General appearance: alert, cooperative and no distress Head: Normocephalic, without obvious abnormality, atraumatic Eyes: negative Nose: Nares normal. Septum midline. Mucosa normal. No drainage or sinus tenderness. Neck: no JVD and supple, symmetrical, trachea midline Lungs: rhonchi bilaterally Heart: regular rate and rhythm, S1, S2 normal, no murmur, click, rub or gallop Abdomen: soft, non-tender; bowel sounds normal; no masses,  no organomegaly Extremities: extremities normal, atraumatic, no cyanosis or edema Pulses: 2+ and symmetric Skin: Skin color, texture, turgor normal. No rashes or lesions Neurologic:  Grossly normal   Labs on Admission:   Recent Labs  01/27/14 1954  NA 130*  K 3.9  CL 92*  CO2 24  GLUCOSE 250*  BUN 19  CREATININE 0.67  CALCIUM 9.1    Recent Labs  01/27/14 1954  AST 27  ALT 12  ALKPHOS 90  BILITOT 0.2*  PROT 8.3  ALBUMIN 3.4*    Recent Labs  01/27/14 1954  WBC 15.5*  NEUTROABS 14.4*  HGB 13.6  HCT 39.0  MCV 88.6  PLT 214   Radiological Exams on Admission: Ct Head Wo Contrast  01/27/2014   CLINICAL DATA:  Transient right  arm weakness.  EXAM: CT HEAD WITHOUT CONTRAST  TECHNIQUE: Contiguous axial images were obtained from the base of the skull through the vertex without intravenous contrast.  COMPARISON:  03/24/2012  FINDINGS: Small, remote infarcts are again seen in the left centrum semiovale, left thalamus, and right cerebellum, unchanged. Periventricular white matter hypodensities do not appear significantly changed and are compatible with moderate chronic small vessel ischemic disease. Cerebral atrophy is unchanged and likely within normal limits for age. There is no evidence of acute cortical infarct, intracranial hemorrhage, mass, midline shift, or extra-axial fluid collection.  Prior bilateral cataract surgery is noted. There is a small to moderate right mastoid effusion. Trace left sphenoid sinus mucosal thickening is noted.  IMPRESSION: 1. No evidence of acute intracranial abnormality. 2. Stable appearance of chronic ischemic changes.   Electronically Signed   By: Logan Bores   On: 01/27/2014 21:15   Dg Chest Port 1 View  (if Code Sepsis Called)  01/27/2014   CLINICAL DATA:  Cough.  EXAM: PORTABLE CHEST - 1 VIEW  COMPARISON:  10/20/2013.  FINDINGS: Mediastinum and hilar structures normal. Interim clearing of right lower lobe atelectasis/ infiltrate and right pleural effusion. Low lung volumes. Chronic interstitial lung disease. No pneumothorax. No acute osseous abnormality. Prior vertebroplasty.  IMPRESSION: Interim clearing of right lower lobe  infiltrate and right pleural effusion from chest x-ray of 10/20/2013. No acute abnormality identified.   Electronically Signed   By: Marcello Moores  Register   On: 01/27/2014 20:30    Assessment/Plan  80 yo female with probably early CAP  Principal Problem:   PNA (pneumonia)-  pna pathway, place on azithro and rocephin.  Blood cx obtained.vss. Oxygen good.  Will hold off on steroids.  Active Problems:  Stable unless o/w noted   DEMENTIA, MILD   HYPERTENSION   SEIZURE DISORDER   Cough   DNR (do not resuscitate)   Fever  hyperglycemia with no dx of dm -  Place on ssi.  Ck hga1c.   DNR.  Admit to med bed.  Linsie Lupo A Shanon Brow 01/27/2014, 11:10 PM

## 2014-01-27 NOTE — ED Notes (Signed)
3rd fluid bolus stopped.

## 2014-01-27 NOTE — ED Notes (Signed)
MD at bedside. 

## 2014-01-27 NOTE — ED Notes (Signed)
Attempted to wean pt off of 02 and placed on RA per family request, pt's 02 sats reading 89% on RA. Pt placed back on 2L 02.

## 2014-01-27 NOTE — ED Notes (Signed)
2nd fluid bolus stopped.  

## 2014-01-28 DIAGNOSIS — D72829 Elevated white blood cell count, unspecified: Secondary | ICD-10-CM

## 2014-01-28 LAB — GLUCOSE, CAPILLARY
Glucose-Capillary: 108 mg/dL — ABNORMAL HIGH (ref 70–99)
Glucose-Capillary: 91 mg/dL (ref 70–99)
Glucose-Capillary: 91 mg/dL (ref 70–99)
Glucose-Capillary: 170 mg/dL — ABNORMAL HIGH (ref 70–99)

## 2014-01-28 LAB — URINE CULTURE
CULTURE: NO GROWTH
Colony Count: NO GROWTH

## 2014-01-28 LAB — LEGIONELLA ANTIGEN, URINE: Legionella Antigen, Urine: NEGATIVE

## 2014-01-28 LAB — BASIC METABOLIC PANEL
BUN: 13 mg/dL (ref 6–23)
CALCIUM: 7.6 mg/dL — AB (ref 8.4–10.5)
CO2: 21 meq/L (ref 19–32)
CREATININE: 0.65 mg/dL (ref 0.50–1.10)
Chloride: 102 mEq/L (ref 96–112)
GFR calc Af Amer: 86 mL/min — ABNORMAL LOW (ref >90)
GFR calc non Af Amer: 74 mL/min — ABNORMAL LOW (ref >90)
GLUCOSE: 124 mg/dL — AB (ref 70–99)
Potassium: 3.4 mEq/L — ABNORMAL LOW (ref 3.7–5.3)
Sodium: 135 mEq/L — ABNORMAL LOW (ref 137–147)

## 2014-01-28 LAB — CBC WITH DIFFERENTIAL/PLATELET
BASOS ABS: 0 10*3/uL (ref 0.0–0.1)
Basophils Relative: 0 % (ref 0–1)
EOS PCT: 0 % (ref 0–5)
Eosinophils Absolute: 0.1 10*3/uL (ref 0.0–0.7)
HCT: 33.1 % — ABNORMAL LOW (ref 36.0–46.0)
Hemoglobin: 10.6 g/dL — ABNORMAL LOW (ref 12.0–15.0)
Lymphocytes Relative: 9 % — ABNORMAL LOW (ref 12–46)
Lymphs Abs: 1 10*3/uL (ref 0.7–4.0)
MCH: 28.7 pg (ref 26.0–34.0)
MCHC: 32 g/dL (ref 30.0–36.0)
MCV: 89.7 fL (ref 78.0–100.0)
Monocytes Absolute: 0.8 10*3/uL (ref 0.1–1.0)
Monocytes Relative: 7 % (ref 3–12)
Neutro Abs: 9.8 10*3/uL — ABNORMAL HIGH (ref 1.7–7.7)
Neutrophils Relative %: 84 % — ABNORMAL HIGH (ref 43–77)
PLATELETS: 177 10*3/uL (ref 150–400)
RBC: 3.69 MIL/uL — ABNORMAL LOW (ref 3.87–5.11)
RDW: 13.3 % (ref 11.5–15.5)
WBC: 11.7 10*3/uL — ABNORMAL HIGH (ref 4.0–10.5)

## 2014-01-28 LAB — STREP PNEUMONIAE URINARY ANTIGEN: Strep Pneumo Urinary Antigen: NEGATIVE

## 2014-01-28 LAB — HEMOGLOBIN A1C
HEMOGLOBIN A1C: 5.6 % (ref <5.7)
Mean Plasma Glucose: 114 mg/dL (ref <117)

## 2014-01-28 MED ORDER — INSULIN ASPART 100 UNIT/ML ~~LOC~~ SOLN: 0.0000 [IU] | Freq: Three times a day (TID) | SUBCUTANEOUS | Status: DC

## 2014-01-28 MED ORDER — HYDROCODONE-HOMATROPINE 5-1.5 MG/5ML PO SYRP
2.5000 mL | ORAL_SOLUTION | Freq: Four times a day (QID) | ORAL | Status: DC | PRN
  Administered 2014-01-29 – 2014-01-31 (×3): 2.5 mL via ORAL
  Filled 2014-01-28 (×3): qty 5

## 2014-01-28 MED ORDER — DONEPEZIL HCL 10 MG PO TABS
10.0000 mg | ORAL_TABLET | Freq: Every day | ORAL | Status: DC
  Administered 2014-01-28 – 2014-02-02 (×6): 10 mg via ORAL
  Filled 2014-01-28 (×7): qty 1

## 2014-01-28 MED ORDER — ACETAMINOPHEN 325 MG PO TABS
650.0000 mg | ORAL_TABLET | Freq: Two times a day (BID) | ORAL | Status: DC | PRN
  Administered 2014-01-29 – 2014-02-01 (×3): 650 mg via ORAL
  Filled 2014-01-28 (×4): qty 2

## 2014-01-28 MED ORDER — ASPIRIN EC 81 MG PO TBEC
81.0000 mg | DELAYED_RELEASE_TABLET | ORAL | Status: DC
  Administered 2014-01-28 – 2014-02-01 (×3): 81 mg via ORAL
  Filled 2014-01-28 (×3): qty 1

## 2014-01-28 MED ORDER — ALBUTEROL SULFATE (2.5 MG/3ML) 0.083% IN NEBU
2.5000 mg | INHALATION_SOLUTION | RESPIRATORY_TRACT | Status: DC | PRN
  Administered 2014-01-28 – 2014-02-03 (×3): 2.5 mg via RESPIRATORY_TRACT
  Filled 2014-01-28 (×3): qty 3

## 2014-01-28 MED ORDER — ENOXAPARIN SODIUM 40 MG/0.4ML ~~LOC~~ SOLN
40.0000 mg | SUBCUTANEOUS | Status: DC
  Administered 2014-01-28 – 2014-02-01 (×5): 40 mg via SUBCUTANEOUS
  Filled 2014-01-28 (×6): qty 0.4

## 2014-01-28 MED ORDER — SODIUM CHLORIDE 0.9 % IV SOLN: INTRAVENOUS | Status: DC

## 2014-01-28 MED ORDER — SODIUM CHLORIDE 0.9 % IV SOLN
INTRAVENOUS | Status: DC
  Administered 2014-01-28: 01:00:00 via INTRAVENOUS

## 2014-01-28 MED ORDER — POTASSIUM CHLORIDE CRYS ER 20 MEQ PO TBCR
40.0000 meq | EXTENDED_RELEASE_TABLET | Freq: Once | ORAL | Status: AC
  Administered 2014-01-28: 40 meq via ORAL
  Filled 2014-01-28: qty 2

## 2014-01-28 NOTE — Progress Notes (Signed)
PROGRESS NOTE  AOI KOUNS WPV:[id number] DOB: Nov 22, 1918 DOA: 01/27/2014 PCP: [name], MD  Assessment/Plan: ?PNA vs viral illness: chest x ray actually shows Interim clearing of right lower lobe infiltrate and right pleural  effusion from chest x-ray of 10/20/2013. No acute abnormality identified. -NP swab -SLP for ? Aspiration- may benefit from modified diet -abx  DEMENTIA, MILD HYPERTENSION  SEIZURE DISORDER  Cough  Fever  hyperglycemia with no dx of dm - Place on ssi. Ck hga1c Hyponatremia- improved   Code Status: DNR Family Communication: daughter at bedside Disposition Plan: inpt   Consultants:  none  Procedures:  none    HPI/Subjective: Resting- will awaken and answer questions  Objective: Filed Vitals:   01/28/14 0500  BP: 99/60  Pulse: 82  Temp: 97.6 F (36.4 C)  Resp: 18    Intake/Output Summary (Last 24 hours) at 01/28/14 1008 Last data filed at 01/27/14 2135  Gross per 24 hour  Intake   2300 ml  Output      0 ml  Net   2300 ml   Filed Weights   01/28/14 0040  Weight: 61 kg (134 lb 7.7 oz)    Exam:   General:  Resting, no increased work of breathing  Cardiovascular: rrr  Respiratory: coarse breath sounds- some cleared with coughing  Abdomen: +Bs, soft  Musculoskeletal: moves all 4 ext   Data Reviewed: Basic Metabolic Panel:  Recent Labs Lab 01/27/14 1954 01/28/14 0340  NA 130* 135*  K 3.9 3.4*  CL 92* 102  CO2 24 21  GLUCOSE 250* 124*  BUN 19 13  CREATININE 0.67 0.65  CALCIUM 9.1 7.6*   Liver Function Tests:  Recent Labs Lab 01/27/14 1954  AST 27  ALT 12  ALKPHOS 90  BILITOT 0.2*  PROT 8.3  ALBUMIN 3.4*   No results found for this basename: LIPASE, AMYLASE,  in the last 168 hours No results found for this basename: AMMONIA,  in the last 168 hours CBC:  Recent Labs Lab 01/27/14 1954 01/28/14 0340  WBC 15.5* 11.7*  NEUTROABS 14.4* 9.8*  HGB 13.6 10.6*  HCT 39.0 33.1*  MCV 88.6 89.7    PLT 214 177   Cardiac Enzymes: No results found for this basename: CKTOTAL, CKMB, CKMBINDEX, TROPONINI,  in the last 168 hours BNP (last 3 results)  Recent Labs  05/27/13 0100 05/28/13 0602  PROBNP 551.6* 338.4   CBG:  Recent Labs Lab 01/28/14 0629  GLUCAP 108*    No results found for this or any previous visit (from the past 240 hour(s)).   Studies: Ct Head Wo Contrast  01/27/2014   CLINICAL DATA:  Transient right arm weakness.  EXAM: CT HEAD WITHOUT CONTRAST  TECHNIQUE: Contiguous axial images were obtained from the base of the skull through the vertex without intravenous contrast.  COMPARISON:  03/24/2012  FINDINGS: Small, remote infarcts are again seen in the left centrum semiovale, left thalamus, and right cerebellum, unchanged. Periventricular white matter hypodensities do not appear significantly changed and are compatible with moderate chronic small vessel ischemic disease. Cerebral atrophy is unchanged and likely within normal limits for age. There is no evidence of acute cortical infarct, intracranial hemorrhage, mass, midline shift, or extra-axial fluid collection.  Prior bilateral cataract surgery is noted. There is a small to moderate right mastoid effusion. Trace left sphenoid sinus mucosal thickening is noted.  IMPRESSION: 1. No evidence of acute intracranial abnormality. 2. Stable appearance of chronic ischemic changes.   Electronically Signed   By:  Logan Bores   On: 01/27/2014 21:15   Dg Chest Port 1 View  (if Code Sepsis Called)  01/27/2014   CLINICAL DATA:  Cough.  EXAM: PORTABLE CHEST - 1 VIEW  COMPARISON:  10/20/2013.  FINDINGS: Mediastinum and hilar structures normal. Interim clearing of right lower lobe atelectasis/ infiltrate and right pleural effusion. Low lung volumes. Chronic interstitial lung disease. No pneumothorax. No acute osseous abnormality. Prior vertebroplasty.  IMPRESSION: Interim clearing of right lower lobe infiltrate and right pleural effusion  from chest x-ray of 10/20/2013. No acute abnormality identified.   Electronically Signed   By: [place]   On: 01/27/2014 20:30    Scheduled Meds: . aspirin EC  81 mg Oral QODAY  . azithromycin  500 mg Intravenous Q24H  . cefTRIAXone (ROCEPHIN)  IV  1 g Intravenous Q24H  . donepezil  10 mg Oral Q supper  . enoxaparin (LOVENOX) injection  40 mg Subcutaneous Q24H  . insulin aspart  0-9 Units Subcutaneous TID WC   Continuous Infusions: . sodium chloride 75 mL/hr at 01/28/14 0053  . sodium chloride     Antibiotics Given (last 72 hours)   None      Principal Problem:   PNA (pneumonia) Active Problems:   DEMENTIA, MILD   HYPERTENSION   SEIZURE DISORDER   Cough   DNR (do not resuscitate)   Fever    Time spent: [facility] Pager [phone]. If 7PM-7AM, please contact night-coverage at [email or web address], password [email or web address] 01/28/2014, 10:08 AM  LOS: 1 day

## 2014-01-28 NOTE — Evaluation (Signed)
Clinical/Bedside Swallow Evaluation Patient Details  Name: [name] MRN: [id number] Date of Birth: 12-Mar-1919  Today's Date: 01/28/2014 Time: 1884-1660 SLP Time Calculation (min): 25 min  Past Medical History:  Past Medical History  Diagnosis Date  . Anemia   . Hyperlipidemia   . Hypertension   . Seizure disorder     poss from hponatremia when had pneumonia  . Recurrent UTI   . Urinary incontinence     mild  . Osteoporosis     Grade II spondylotistesis at l4-l5 testing in [place] 2008 and confirmed that she has osteoporosis  . Spondylolisthesis of lumbar region     Grade II spondylotistesis at l4-l5 testing in [place] 2008   . Contusion of leg 08/06/2011  . Hematoma 08/06/2011    leg near shin no bov underlying bony abnormality and no infection. should take a while to resolve observe for infection   . Chest wall pain     "off and on" (05/26/2013)  . COPD (chronic obstructive pulmonary disease)   . Pleural effusion     "a few times" (05/26/2013)  . Seizures ~ 2009    "after pneumonia; electrolytes were all messed up" (05/26/2013)  . Pneumonia 09/2002; ~ 2009;   . Arthritis   . Osteoarthritis   . Thoracic compression fracture   . Thoracic back pain   . Skin cancer 2012    leg   . Dementia    Past Surgical History:  Past Surgical History  Procedure Laterality Date  . Abdominal hysterectomy    . Lumbar disc surgery    . Breast lumpectomy Bilateral      had fibrofatty tissue  . Cataract extraction w/ intraocular lens implant Left   . Glaucoma surgery Left   . Eye surgery    . Skin cancer excision  2012   HPI:  80 yo female admitted 01/27/14 due to fever, cough, and SOB.  PMH significant for mild-moderate dementia, htn, hld, copd.   Assessment / Plan / Recommendation Clinical Impression  Pt oral motor strength and function appears adequate. Daughter (present) reports dentures are ill-fitting, but that pt can tolerate most foods without dentures. No overt s/s aspiration  were observed during po trials.  Daughter reported pt usually does not use a straw, and frequently coughs after drinking liquids. MBS was completed 07/20/08, however, results were not available. Per CXR, RLL infiltrate has improved since February.  Pt exhibited no overt s/s aspiration during bedside assessment, however, silent aspiration cannot be detected at bedside, and COPD/dementia increase risk of silent aspiration/dysphagia .  Discussed with daughter the options of repeating objective study (MBS or FEES) to fully assess swallow function and safety, and identify least restrictive diet and applicable strategies and/or positions.  Daughter indicated she would like to discuss this with her husband before making a decision.  ST to follow up next date for po tolerance, and decisions regarding whether to do or not do objective study.      Aspiration Risk  Moderate    Diet Recommendation Dysphagia 3 (Mechanical Soft);Thin liquid   Liquid Administration via: Cup;Straw Medication Administration: Whole meds with liquid Supervision: Staff to assist with self feeding Compensations: Slow rate;Small sips/bites Postural Changes and/or Swallow Maneuvers: Seated upright 90 degrees    Other  Recommendations Recommended Consults: MBS;FEES (if within pt/family wishes.) Oral Care Recommendations: Oral care Q4 per protocol   Follow Up Recommendations   (TBD)    Frequency and Duration min 1 x/week  1 week  Pertinent Vitals/Pain VSS, no pain reported    SLP Swallow Goals  po tolerance   Swallow Study Prior Functional Status   Has dentures at home, but are ill-fitting. Tolerates soft foods/thin liquids at home.    General Date of Onset: 01/27/14 HPI: 80 yo female admitted 01/27/14 due to fever, cough, and SOB.  PMH significant for mild-moderate dementia, htn, hld, copd. Type of Study: Bedside swallow evaluation Previous Swallow Assessment: MBS 07/20/08 Diet Prior to this Study: Regular;Thin  liquids Temperature Spikes Noted: No Respiratory Status: Nasal cannula History of Recent Intubation: No Behavior/Cognition: Alert;Cooperative;Pleasant mood;Confused Oral Cavity - Dentition: Dentures, not available;Edentulous Self-Feeding Abilities: Needs assist Patient Positioning: Upright in bed Baseline Vocal Quality: Clear Volitional Cough: Weak Volitional Swallow: Able to elicit    Oral/Motor/Sensory Function Overall Oral Motor/Sensory Function: Appears within functional limits for tasks assessed   Ice Chips Ice chips: Not tested   Thin Liquid Thin Liquid: Within functional limits Presentation: Straw    Nectar Thick Nectar Thick Liquid: Not tested   Honey Thick Honey Thick Liquid: Not tested   Puree Puree: Within functional limits Presentation: [place] [facility], CCC-SLP [phone] Solid: Within functional limits Presentation: [place] 01/28/2014,4:18 PM

## 2014-01-28 NOTE — Care Management Note (Signed)
    Page 1 of 2   02/03/2014     3:30:50 PM CARE MANAGEMENT NOTE 02/03/2014  Patient:  [name], [name]   Account Number:  [id number]  Date Initiated:  01/28/2014  Documentation initiated by:  Chiamaka Latka  Subjective/Objective Assessment:   Pt adm on 01/27/14 with PNA.  PTA, pt resides at home with daughter.     Action/Plan:   Will follow for dc needs as pt progresses.  Recommend PT consult to determine home needs at dc.   Anticipated DC Date:  02/03/2014   Anticipated DC Plan:  [place]  CM consult      [facility] Choice  HOME HEALTH   Choice offered to / List presented to:  C-1 Patient   DME arranged  [facility]      DME agency  [place] arranged  HH-1 RN  [place]      [place].   Status of service:  Completed, signed off Medicare Important Message given?  YES (If response is "NO", the following Medicare IM given date fields will be blank) Date Medicare IM given:  02/03/2014 Date Additional Medicare IM given:    Discharge Disposition:  [place]  Per UR Regulation:  Reviewed for med. necessity/level of care/duration of stay  If discussed at [place] of Stay Meetings, dates discussed:   02/03/2014    Comments:  02/03/14 Ellan Lambert, RN, BSN [phone] Pt for dc home today with daughter and son in law.  Met with daughter to discuss dc plans.  Pt will need home oxygen, as cont to desat on room air.  Referral to [facility] for home O2 set up; portable tank delivered to daughter prior to dc.  Pt to dc via ambulance transport; csw consulted to arrange.  HH ordered by MD; daughter agreeable to this, and prefers [facility] for [facility] follow up.  Referral to [facility]; start of care 24-48h post dc date.

## 2014-01-28 NOTE — Progress Notes (Signed)
Utilization Review Completed.Payden Bonus T Dowell5/27/2015  

## 2014-01-28 NOTE — Evaluation (Signed)
Physical Therapy Evaluation Patient Details Name: [name] MRN: [id number] DOB: 05/06/1919 Today's Date: 01/28/2014   History of Present Illness  Pt admit for PNA.    Clinical Impression  Pt admitted with above. Pt currently with functional limitations due to the deficits listed below (see PT Problem List). As long as pt has 24 hour care, can d/c home with HHPT.   Pt will benefit from skilled PT to increase their independence and safety with mobility to allow discharge to the venue listed below.     Follow Up Recommendations Home health PT;Supervision/Assistance - 24 hour    Equipment Recommendations  Other (comment) (TBA)    Recommendations for Other Services       Precautions / Restrictions Precautions Precautions: Fall Restrictions Weight Bearing Restrictions: No      Mobility  Bed Mobility Overal bed mobility: Needs Assistance;+2 for physical assistance Bed Mobility: Supine to Sit     Supine to sit: Mod assist;+2 for physical assistance     General bed mobility comments: Needed assist to move LEs off bed and for elevation of trunk.   Transfers Overall transfer level: Needs assistance Equipment used: Rolling walker (2 wheeled) Transfers: Sit to/from Stand Sit to Stand: Mod assist;+2 physical assistance         General transfer comment: Pt needs assit for stability either HHA of 2 or RW.    Ambulation/Gait Ambulation/Gait assistance: Mod assist;+2 physical assistance Ambulation Distance (Feet): 14 Feet Assistive device: 2 person hand held assist Gait Pattern/deviations: Step-through pattern;Decreased step length - right;Decreased step length - left;Narrow base of support;Trunk flexed;Antalgic;Leaning posteriorly;Shuffle   Gait velocity interpretation: Below normal speed for age/gender General Gait Details: Pt ambulated with poor postural stability overall. Pt unsteady and as she fatigues, she flexes her hips, knees and trunk which makes it harder for her  to ambulate.    Stairs            Wheelchair Mobility    Modified Rankin (Stroke Patients Only)       Balance Overall balance assessment: Needs assistance;History of Falls Sitting-balance support: Bilateral upper extremity supported;Feet supported Sitting balance-Leahy Scale: Poor Sitting balance - Comments: Needs min guard assist to sit EOB.   Postural control: Posterior lean Standing balance support: Bilateral upper extremity supported;During functional activity Standing balance-Leahy Scale: Poor Standing balance comment: Must have UE support bil.                               Pertinent Vitals/Pain O2 sat desat to 74% on RA.  93% with 2LO2, No pain    Home Living Family/patient expects to be discharged to:: Private residence Living Arrangements: Children Available Help at Discharge: Family;Available 24 hours/day;Personal care attendant Type of Home: House Home Access: Stairs to enter   [employer] of Steps: 3 Home Layout: Able to live on main level with bedroom/bathroom Home Equipment: Walker - 4 wheels;Shower seat;Grab bars - toilet;Grab bars - tub/shower;Transport chair      Prior Function Level of Independence: Needs assistance   Gait / Transfers Assistance Needed: supervision for gait and transfers  ADL's / Homemaking Assistance Needed: assist for all homemaking and supervision for transfers into bath and for completeness        Hand Dominance   Dominant Hand: Right    Extremity/Trunk Assessment   Upper Extremity Assessment: Defer to OT evaluation           Lower Extremity Assessment: Generalized  weakness      Cervical / Trunk Assessment: Kyphotic  Communication   Communication: No difficulties  Cognition Arousal/Alertness: Awake/alert Behavior During Therapy: WFL for tasks assessed/performed Overall Cognitive Status: History of cognitive impairments - at baseline       Memory: Decreased short-term memory               General Comments      Exercises General Exercises - Lower Extremity Ankle Circles/Pumps: AROM;Both;5 reps;Supine Long Arc Quad: AROM;Both;10 reps;Seated      Assessment/Plan    PT Assessment Patient needs continued PT services  PT Diagnosis Generalized weakness   PT Problem List Decreased activity tolerance;Decreased balance;Decreased mobility;Decreased knowledge of use of DME;Decreased knowledge of precautions;Decreased safety awareness  PT Treatment Interventions DME instruction;Gait training;Functional mobility training;Therapeutic activities;Therapeutic exercise;Balance training;Patient/family education   PT Goals (Current goals can be found in the Care Plan section) Acute Rehab PT Goals Patient Stated Goal: to go home PT Goal Formulation: With patient Time For Goal Achievement: 02/04/14 Potential to Achieve Goals: Good    Frequency Min 3X/week   Barriers to discharge        Co-evaluation               End of Session Equipment Utilized During Treatment: Gait belt;Oxygen Activity Tolerance: Patient limited by fatigue Patient left: in chair;with call bell/phone within reach Nurse Communication: Mobility status         Time: 3893-7342 PT Time Calculation (min): 24 min   Charges:   PT Evaluation $Initial PT Evaluation Tier I: 1 Procedure PT Treatments $Gait Training: 8-22 mins   PT G Codes:          Jaxtyn [place] Dorene Ar 02-02-2014, 2:15 PM  [facility] Acute Rehabilitation [phone] (pager)

## 2014-01-29 ENCOUNTER — Inpatient Hospital Stay (HOSPITAL_COMMUNITY): Payer: Medicare Other

## 2014-01-29 DIAGNOSIS — E876 Hypokalemia: Secondary | ICD-10-CM | POA: Diagnosis not present

## 2014-01-29 LAB — BASIC METABOLIC PANEL
BUN: 11 mg/dL (ref 6–23)
CO2: 23 meq/L (ref 19–32)
CREATININE: 0.67 mg/dL (ref 0.50–1.10)
Calcium: 8 mg/dL — ABNORMAL LOW (ref 8.4–10.5)
Chloride: 98 mEq/L (ref 96–112)
GFR calc Af Amer: 85 mL/min — ABNORMAL LOW (ref >90)
GFR calc non Af Amer: 73 mL/min — ABNORMAL LOW (ref >90)
Glucose, Bld: 125 mg/dL — ABNORMAL HIGH (ref 70–99)
Potassium: 3.1 mEq/L — ABNORMAL LOW (ref 3.7–5.3)
Sodium: 133 mEq/L — ABNORMAL LOW (ref 137–147)

## 2014-01-29 LAB — CBC
HCT: 30 % — ABNORMAL LOW (ref 36.0–46.0)
Hemoglobin: 10.2 g/dL — ABNORMAL LOW (ref 12.0–15.0)
MCH: 30.2 pg (ref 26.0–34.0)
MCHC: 34 g/dL (ref 30.0–36.0)
MCV: 88.8 fL (ref 78.0–100.0)
Platelets: 170 10*3/uL (ref 150–400)
RBC: 3.38 MIL/uL — AB (ref 3.87–5.11)
RDW: 13.4 % (ref 11.5–15.5)
WBC: 13.5 10*3/uL — AB (ref 4.0–10.5)

## 2014-01-29 LAB — GLUCOSE, CAPILLARY
Glucose-Capillary: 111 mg/dL — ABNORMAL HIGH (ref 70–99)
Glucose-Capillary: 144 mg/dL — ABNORMAL HIGH (ref 70–99)
Glucose-Capillary: 131 mg/dL — ABNORMAL HIGH (ref 70–99)

## 2014-01-29 LAB — TROPONIN I: Troponin I: <0.3 ng/mL (ref <0.30)

## 2014-01-29 LAB — RESPIRATORY VIRUS PANEL
Adenovirus: NOT DETECTED
INFLUENZA B 1: NOT DETECTED
Influenza A H1: NOT DETECTED
Influenza A H3: NOT DETECTED
Influenza A: NOT DETECTED
METAPNEUMOVIRUS: NOT DETECTED
PARAINFLUENZA 3 A: NOT DETECTED
Parainfluenza 1: NOT DETECTED
Parainfluenza 2: NOT DETECTED
RESPIRATORY SYNCYTIAL VIRUS A: NOT DETECTED
Respiratory Syncytial Virus B: NOT DETECTED
Rhinovirus: NOT DETECTED

## 2014-01-29 LAB — MAGNESIUM: Magnesium: 1.9 mg/dL (ref 1.5–2.5)

## 2014-01-29 MED ORDER — POTASSIUM CHLORIDE 10 MEQ/100ML IV SOLN
10.0000 meq | INTRAVENOUS | Status: AC
  Administered 2014-01-29 (×3): 10 meq via INTRAVENOUS
  Filled 2014-01-29 (×4): qty 100

## 2014-01-29 MED ORDER — CHLORHEXIDINE GLUCONATE 0.12 % MT SOLN
15.0000 mL | Freq: Two times a day (BID) | OROMUCOSAL | Status: DC
  Administered 2014-01-29 – 2014-02-03 (×8): 15 mL via OROMUCOSAL
  Filled 2014-01-29 (×12): qty 15

## 2014-01-29 MED ORDER — BIOTENE DRY MOUTH MT LIQD
15.0000 mL | Freq: Two times a day (BID) | OROMUCOSAL | Status: DC
  Administered 2014-01-29 – 2014-02-03 (×8): 15 mL via OROMUCOSAL

## 2014-01-29 MED ORDER — AMPICILLIN-SULBACTAM SODIUM 3 (2-1) G IJ SOLR
3.0000 g | Freq: Four times a day (QID) | INTRAMUSCULAR | Status: DC
  Administered 2014-01-29 – 2014-02-01 (×13): 3 g via INTRAVENOUS
  Filled 2014-01-29 (×16): qty 3

## 2014-01-29 NOTE — Progress Notes (Signed)
Speech Language Pathology   Patient Details Name: [name] MRN: [id number] DOB: [birth year] Today's Date: 01/29/2014 Time:  -    Discussed option of performing MBS with MD who stated MBS would help give prognostic evidence of current swallow function as a Palliative care has been consulted.  MBS today at 1300.  [name] [employer] [phone]  01/29/2014

## 2014-01-29 NOTE — Progress Notes (Signed)
PROGRESS NOTE  CANDAS DEEMER SEG:[id number] DOB: 08-Feb-1919 DOA: 01/27/2014 PCP: [name], MD  Assessment/Plan: Fever:  ?PNA vs viral illness: CXR to my reading expiratory film and difficult to r/o pneumonia. Still having fevers on rocephin and azithro. Urine legionella and strep pneumonia negative. BC negative to date. UA negative. Will repeat CXR, BC. Continue azithro. Change rocephin to unasyn in case aspiration related.  Respiratory virus panel pending. MBS today.  DEMENTIA  Hypokalemia: replete and check mag  HYPERTENSION   SEIZURE DISORDER   hyperglycemia with no dx of dm:  Hgb A1c 5.6. D/c CBG and SSI  Hyponatremia stable  PT eval pending  Code Status: DNR Family Communication: none availale Disposition Plan: inpt   Consultants:  none  Procedures:  none  HPI/Subjective: Unable. Had temp of 102.9 early this morning.  Objective: Filed Vitals:   01/29/14 0822  BP:   Pulse:   Temp: 98.7 F (37.1 C)  Resp:     Intake/Output Summary (Last 24 hours) at 01/29/14 1102 Last data filed at 01/28/14 2000  Gross per 24 hour  Intake    120 ml  Output    850 ml  Net   -730 ml   Filed Weights   01/28/14 0040  Weight: 61 kg (134 lb 7.7 oz)    Exam:   General:  Weak appearing. Won't say much  Cardiovascular: rrr without MGR  Respiratory: CTA without WRR  Abdomen: +Bs, soft  Ext no CCE  Data Reviewed: Basic Metabolic Panel:  Recent Labs Lab 01/27/14 1954 01/28/14 0340 01/29/14 0355  NA 130* 135* 133*  K 3.9 3.4* 3.1*  CL 92* 102 98  CO2 24 21 23   GLUCOSE 250* 124* 125*  BUN 19 13 11   CREATININE 0.67 0.65 0.67  CALCIUM 9.1 7.6* 8.0*   Liver Function Tests:  Recent Labs Lab 01/27/14 1954  AST 27  ALT 12  ALKPHOS 90  BILITOT 0.2*  PROT 8.3  ALBUMIN 3.4*   No results found for this basename: LIPASE, AMYLASE,  in the last 168 hours No results found for this basename: AMMONIA,  in the last 168 hours CBC:  Recent  Labs Lab 01/27/14 1954 01/28/14 0340 01/29/14 0355  WBC 15.5* 11.7* 13.5*  NEUTROABS 14.4* 9.8*  --   HGB 13.6 10.6* 10.2*  HCT 39.0 33.1* 30.0*  MCV 88.6 89.7 88.8  PLT 214 177 170   Cardiac Enzymes: No results found for this basename: CKTOTAL, CKMB, CKMBINDEX, TROPONINI,  in the last 168 hours BNP (last 3 results)  Recent Labs  05/27/13 0100 05/28/13 0602  PROBNP 551.6* 338.4   CBG:  Recent Labs Lab 01/28/14 0629 01/28/14 1130 01/28/14 1634 01/28/14 2135 01/29/14 0635  GLUCAP 108* 91 91 170* 111*    Recent Results (from the past 240 hour(s))  CULTURE, BLOOD (ROUTINE X 2)     Status: None   Collection Time    01/27/14  7:54 PM      Result Value Ref Range Status   Specimen Description BLOOD RIGHT FOREARM   Final   Special Requests BOTTLES DRAWN AEROBIC AND ANAEROBIC 5CC   Final   Culture  Setup Time     Final   Value: 01/28/2014 00:27     Performed at [facility]   Culture     Final   Value:        BLOOD CULTURE RECEIVED NO GROWTH TO DATE CULTURE WILL BE HELD FOR 5 DAYS BEFORE ISSUING A FINAL NEGATIVE REPORT  Performed at [facility]   Report Status PENDING   Incomplete  CULTURE, BLOOD (ROUTINE X 2)     Status: None   Collection Time    01/27/14  7:59 PM      Result Value Ref Range Status   Specimen Description BLOOD HAND RIGHT   Final   Special Requests BOTTLES DRAWN AEROBIC ONLY 5CC   Final   Culture  Setup Time     Final   Value: 01/28/2014 00:26     Performed at [facility]   Culture     Final   Value:        BLOOD CULTURE RECEIVED NO GROWTH TO DATE CULTURE WILL BE HELD FOR 5 DAYS BEFORE ISSUING A FINAL NEGATIVE REPORT     Performed at [facility]   Report Status PENDING   Incomplete  URINE CULTURE     Status: None   Collection Time    01/27/14  9:34 PM      Result Value Ref Range Status   Specimen Description URINE, CATHETERIZED   Final   Special Requests NONE   Final   Culture  Setup Time     Final    Value: 01/27/2014 22:46     Performed at [place]     Final   Value: NO GROWTH     Performed at [facility]   Culture     Final   Value: NO GROWTH     Performed at [facility]   Report Status 01/28/2014 FINAL   Final     Studies: Ct Head Wo Contrast  01/27/2014   CLINICAL DATA:  Transient right arm weakness.  EXAM: CT HEAD WITHOUT CONTRAST  TECHNIQUE: Contiguous axial images were obtained from the base of the skull through the vertex without intravenous contrast.  COMPARISON:  03/24/2012  FINDINGS: Small, remote infarcts are again seen in the left centrum semiovale, left thalamus, and right cerebellum, unchanged. Periventricular white matter hypodensities do not appear significantly changed and are compatible with moderate chronic small vessel ischemic disease. Cerebral atrophy is unchanged and likely within normal limits for age. There is no evidence of acute cortical infarct, intracranial hemorrhage, mass, midline shift, or extra-axial fluid collection.  Prior bilateral cataract surgery is noted. There is a small to moderate right mastoid effusion. Trace left sphenoid sinus mucosal thickening is noted.  IMPRESSION: 1. No evidence of acute intracranial abnormality. 2. Stable appearance of chronic ischemic changes.   Electronically Signed   By: Logan Bores   On: 01/27/2014 21:15   Dg Chest Port 1 View  (if Code Sepsis Called)  01/27/2014   CLINICAL DATA:  Cough.  EXAM: PORTABLE CHEST - 1 VIEW  COMPARISON:  10/20/2013.  FINDINGS: Mediastinum and hilar structures normal. Interim clearing of right lower lobe atelectasis/ infiltrate and right pleural effusion. Low lung volumes. Chronic interstitial lung disease. No pneumothorax. No acute osseous abnormality. Prior vertebroplasty.  IMPRESSION: Interim clearing of right lower lobe infiltrate and right pleural effusion from chest x-ray of 10/20/2013. No acute abnormality identified.   Electronically Signed   By:  Marcello Moores  Register   On: 01/27/2014 20:30    Scheduled Meds: . ampicillin-sulbactam (UNASYN) IV  3 g Intravenous Q6H  . aspirin EC  81 mg Oral QODAY  . azithromycin  500 mg Intravenous Q24H  . donepezil  10 mg Oral Q supper  . enoxaparin (LOVENOX) injection  40 mg Subcutaneous Q24H  .  insulin aspart  0-9 Units Subcutaneous TID WC  . potassium chloride  10 mEq Intravenous Q1 Hr x 4   Continuous Infusions:   Antibiotics Given (last 72 hours)   None      Time spent: 35 min  Delfina Redwood, MD  [facility] Pager [phone]. If 7PM-7AM, please contact night-coverage at [email or web address], password [facility] 01/29/2014, 11:02 AM  LOS: 2 days

## 2014-01-29 NOTE — Procedures (Signed)
Objective Swallowing Evaluation: Modified Barium Swallowing Study  Patient Details  Name: [name] MRN: [id number] Date of Birth: [birth year]  Today's Date: 01/29/2014 Time: 04-1339 SLP Time Calculation (min): 15 min  Past Medical History:  Past Medical History  Diagnosis Date  . Anemia   . Hyperlipidemia   . Hypertension   . Seizure disorder     poss from hponatremia when had pneumonia  . Recurrent UTI   . Urinary incontinence     mild  . Osteoporosis     Grade II spondylotistesis at l4-l5 testing in [place] 2008 and confirmed that she has osteoporosis  . Spondylolisthesis of lumbar region     Grade II spondylotistesis at l4-l5 testing in [place] 2008   . Contusion of leg 08/06/2011  . Hematoma 08/06/2011    leg near shin no bov underlying bony abnormality and no infection. should take a while to resolve observe for infection   . Chest wall pain     "off and on" (05/26/2013)  . COPD (chronic obstructive pulmonary disease)   . Pleural effusion     "a few times" (05/26/2013)  . Seizures ~ 2009    "after pneumonia; electrolytes were all messed up" (05/26/2013)  . Pneumonia 09/2002; ~ 2009;   . Arthritis   . Osteoarthritis   . Thoracic compression fracture   . Thoracic back pain   . Skin cancer 2012    leg   . Dementia    Past Surgical History:  Past Surgical History  Procedure Laterality Date  . Abdominal hysterectomy    . Lumbar disc surgery    . Breast lumpectomy Bilateral      had fibrofatty tissue  . Cataract extraction w/ intraocular lens implant Left   . Glaucoma surgery Left   . Eye surgery    . Skin cancer excision  2012   HPI:  80 yo female admitted 01/27/14 due to fever, cough, and SOB.  PMH significant for mild-moderate dementia, htn, hld, copd.  MBS recommended to assess for silent aspiration and safest diet texture and liquid recommendations.     Assessment / Plan / Recommendation Clinical Impression  Dysphagia Diagnosis: Mild oral phase  dysphagia Clinical impression: Pt. exhibited a mild oral dysphagia characterized by initial transit delays and prolonged mastication with solids.  Pharyngeal phase was within functional limits for a pt. of 80 years of age.  Swallow initiation was timely with flash penetration x 1 with straw (ie. barium briefly entered laryngeal vestibule before exiting during the swallow).  Trace vallecular residue which did not increase aspiration risk.  It is possible pt. may have intermittent episodes of aspiration although today's MBS appeared typical for pt. of same age.  Recommend continue Dys 3 due to current deconditioning and thin liquid via cup, no straws, pills whole in applesauce, sit upright, small bites/sips.  ST can follow up for education with daughter on swallow strategies x 1.      Treatment Recommendation  Therapy as outlined in treatment plan below    Diet Recommendation Dysphagia 3 (Mechanical Soft);Thin liquid   Liquid Administration via: Cup;No straw Medication Administration: Whole meds with puree Supervision: Patient able to self feed;Intermittent supervision to cue for compensatory strategies Compensations: Slow rate;Small sips/bites Postural Changes and/or Swallow Maneuvers: Seated upright 90 degrees    Other  Recommendations Oral Care Recommendations: Oral care BID   Follow Up Recommendations  None    Frequency and Duration min 1 x/week  2 weeks   Pertinent Vitals/Pain WDL  Reason for Referral Objectively evaluate swallowing function   Oral Phase Oral Preparation/Oral Phase Oral Phase: Impaired Oral - Solids Oral - Puree: Delayed oral transit Oral - Regular: Delayed oral transit   Pharyngeal Phase Pharyngeal Phase Pharyngeal Phase: Impaired Pharyngeal - Honey Pharyngeal - Honey Teaspoon: Pharyngeal residue - valleculae;Reduced tongue base retraction (trace) Pharyngeal - Nectar Pharyngeal - Nectar Cup: Pharyngeal residue - valleculae;Reduced tongue base  retraction (trace) Pharyngeal - Thin Pharyngeal - Thin Teaspoon: Within functional limits Pharyngeal - Thin Cup: Pharyngeal residue - valleculae;Reduced tongue base retraction (trace) Pharyngeal - Thin Straw: Penetration/Aspiration during swallow Penetration/Aspiration details (thin straw): Material enters airway, remains ABOVE vocal cords then ejected out Pharyngeal - Solids Pharyngeal - Puree: Within functional limits  Cervical Esophageal Phase        Cervical Esophageal Phase Cervical Esophageal Phase: [facility]         [place]ranford Mon.Ed [employer] [phone]  01/29/2014

## 2014-01-30 DIAGNOSIS — E876 Hypokalemia: Secondary | ICD-10-CM

## 2014-01-30 LAB — CBC WITH DIFFERENTIAL/PLATELET
Basophils Absolute: 0 10*3/uL (ref 0.0–0.1)
Basophils Relative: 0 % (ref 0–1)
Eosinophils Absolute: 0.1 10*3/uL (ref 0.0–0.7)
Eosinophils Relative: 1 % (ref 0–5)
HEMATOCRIT: 30.2 % — AB (ref 36.0–46.0)
HEMOGLOBIN: 10.1 g/dL — AB (ref 12.0–15.0)
LYMPHS ABS: 0.8 10*3/uL (ref 0.7–4.0)
Lymphocytes Relative: 7 % — ABNORMAL LOW (ref 12–46)
MCH: 29.5 pg (ref 26.0–34.0)
MCHC: 33.4 g/dL (ref 30.0–36.0)
MCV: 88.3 fL (ref 78.0–100.0)
MONOS PCT: 5 % (ref 3–12)
Monocytes Absolute: 0.6 10*3/uL (ref 0.1–1.0)
NEUTROS ABS: 10 10*3/uL — AB (ref 1.7–7.7)
NEUTROS PCT: 87 % — AB (ref 43–77)
Platelets: 178 10*3/uL (ref 150–400)
RBC: 3.42 MIL/uL — ABNORMAL LOW (ref 3.87–5.11)
RDW: 13.5 % (ref 11.5–15.5)
WBC: 11.5 10*3/uL — ABNORMAL HIGH (ref 4.0–10.5)

## 2014-01-30 LAB — BASIC METABOLIC PANEL
BUN: 12 mg/dL (ref 6–23)
CO2: 24 mEq/L (ref 19–32)
Calcium: 8.2 mg/dL — ABNORMAL LOW (ref 8.4–10.5)
Chloride: 100 mEq/L (ref 96–112)
Creatinine, Ser: 0.52 mg/dL (ref 0.50–1.10)
GFR calc non Af Amer: 80 mL/min — ABNORMAL LOW (ref >90)
GLUCOSE: 101 mg/dL — AB (ref 70–99)
POTASSIUM: 3.5 meq/L — AB (ref 3.7–5.3)
Sodium: 136 mEq/L — ABNORMAL LOW (ref 137–147)

## 2014-01-30 LAB — TROPONIN I

## 2014-01-30 LAB — GLUCOSE, CAPILLARY: GLUCOSE-CAPILLARY: 127 mg/dL — AB (ref 70–99)

## 2014-01-30 MED ORDER — POTASSIUM CHLORIDE 10 MEQ/100ML IV SOLN
10.0000 meq | INTRAVENOUS | Status: AC
Start: 2014-01-30 — End: 2014-01-31
  Administered 2014-01-30 – 2014-01-31 (×4): 10 meq via INTRAVENOUS
  Filled 2014-01-30 (×4): qty 100

## 2014-01-30 NOTE — Progress Notes (Signed)
PROGRESS NOTE  [name] ZJI:[id number] DOB: 11-25-1918 DOA: 01/27/2014 PCP: [name], MD  Assessment/Plan: Pneumonia, possibly aspiration related. Xray confirms. Fevers trending down on current abx. May need home oxygen. Continue current for now  DEMENTIA  Hypokalemia: better. Still low. Replete. mag ok  HYPERTENSION   SEIZURE DISORDER   Hyponatremia stable  PT eval pending  Code Status: DNR Family Communication: Son in Sports coach, Dr. Annamaria Boots 5/28 Disposition Plan: inpt   Consultants:  none  Procedures:  none  HPI/Subjective: No complaints  Objective: Filed Vitals:   01/30/14 1307  BP: 137/67  Pulse: 90  Temp: 98.3 F (36.8 C)  Resp: 18    Intake/Output Summary (Last 24 hours) at 01/30/14 2026 Last data filed at 01/30/14 1814  Gross per 24 hour  Intake    680 ml  Output    550 ml  Net    130 ml   Filed Weights   01/28/14 0040  Weight: 61 kg (134 lb 7.7 oz)    Exam:   General:  Brighter. Answers "yes" to all questions  Cardiovascular: rrr without MGR  Respiratory: CTA without WRR  Abdomen: +Bs, soft  Ext no CCE  Data Reviewed: Basic Metabolic Panel:  Recent Labs Lab 01/27/14 1954 01/28/14 0340 01/29/14 0355 01/29/14 1145 01/30/14 0617  NA 130* 135* 133*  --  136*  K 3.9 3.4* 3.1*  --  3.5*  CL 92* 102 98  --  100  CO2 24 21 23   --  24  GLUCOSE 250* 124* 125*  --  101*  BUN 19 13 11   --  12  CREATININE 0.67 0.65 0.67  --  0.52  CALCIUM 9.1 7.6* 8.0*  --  8.2*  MG  --   --   --  1.9  --    Liver Function Tests:  Recent Labs Lab 01/27/14 1954  AST 27  ALT 12  ALKPHOS 90  BILITOT 0.2*  PROT 8.3  ALBUMIN 3.4*   No results found for this basename: LIPASE, AMYLASE,  in the last 168 hours No results found for this basename: AMMONIA,  in the last 168 hours CBC:  Recent Labs Lab 01/27/14 1954 01/28/14 0340 01/29/14 0355 01/30/14 0617  WBC 15.5* 11.7* 13.5* 11.5*  NEUTROABS 14.4* 9.8*  --  10.0*  HGB 13.6  10.6* 10.2* 10.1*  HCT 39.0 33.1* 30.0* 30.2*  MCV 88.6 89.7 88.8 88.3  PLT 214 177 170 178   Cardiac Enzymes:  Recent Labs Lab 01/29/14 1832 01/30/14 0112 01/30/14 0617  TROPONINI <0.30 <0.30 <0.30   BNP (last 3 results)  Recent Labs  05/27/13 0100 05/28/13 0602  PROBNP 551.6* 338.4   CBG:  Recent Labs Lab 01/28/14 2135 01/29/14 0635 01/29/14 1102 01/29/14 1611 01/29/14 2119  GLUCAP 170* 111* 144* 131* 127*    Recent Results (from the past 240 hour(s))  CULTURE, BLOOD (ROUTINE X 2)     Status: None   Collection Time    01/27/14  7:54 PM      Result Value Ref Range Status   Specimen Description BLOOD RIGHT FOREARM   Final   Special Requests BOTTLES DRAWN AEROBIC AND ANAEROBIC 5CC   Final   Culture  Setup Time     Final   Value: 01/28/2014 00:27     Performed at [facility]   Culture     Final   Value:        BLOOD CULTURE RECEIVED NO GROWTH TO DATE CULTURE WILL BE  HELD FOR 5 DAYS BEFORE ISSUING A FINAL NEGATIVE REPORT     Performed at [facility]   Report Status PENDING   Incomplete  CULTURE, BLOOD (ROUTINE X 2)     Status: None   Collection Time    01/27/14  7:59 PM      Result Value Ref Range Status   Specimen Description BLOOD HAND RIGHT   Final   Special Requests BOTTLES DRAWN AEROBIC ONLY 5CC   Final   Culture  Setup Time     Final   Value: 01/28/2014 00:26     Performed at [facility]   Culture     Final   Value:        BLOOD CULTURE RECEIVED NO GROWTH TO DATE CULTURE WILL BE HELD FOR 5 DAYS BEFORE ISSUING A FINAL NEGATIVE REPORT     Performed at [facility]   Report Status PENDING   Incomplete  URINE CULTURE     Status: None   Collection Time    01/27/14  9:34 PM      Result Value Ref Range Status   Specimen Description URINE, CATHETERIZED   Final   Special Requests NONE   Final   Culture  Setup Time     Final   Value: 01/27/2014 22:46     Performed at [facility]     Final    Value: NO GROWTH     Performed at [facility]   Culture     Final   Value: NO GROWTH     Performed at [facility]   Report Status 01/28/2014 FINAL   Final  RESPIRATORY VIRUS PANEL     Status: None   Collection Time    01/28/14  6:55 PM      Result Value Ref Range Status   Source - [facility] NASAL SWAB   Corrected   Comment: CORRECTED ON 05/28 AT 1813: PREVIOUSLY REPORTED AS NASAL SWAB   Respiratory Syncytial Virus A NOT DETECTED   Final   Respiratory Syncytial Virus B NOT DETECTED   Final   Influenza A NOT DETECTED   Final   Influenza B NOT DETECTED   Final   Parainfluenza 1 NOT DETECTED   Final   Parainfluenza 2 NOT DETECTED   Final   Parainfluenza 3 NOT DETECTED   Final   Metapneumovirus NOT DETECTED   Final   Rhinovirus NOT DETECTED   Final   Adenovirus NOT DETECTED   Final   Influenza A H1 NOT DETECTED   Final   Influenza A H3 NOT DETECTED   Final   Comment: (NOTE)           Normal Reference Range for each Analyte: NOT DETECTED     Testing performed using the Luminex xTAG Respiratory Viral Panel test     kit.     This test was developed and its performance characteristics determined     by [facility]. It has not been cleared or approved by the [place]     Food and Drug Administration. This test is used for clinical purposes.     It should not be regarded as investigational or for research. This     laboratory is certified under the [place] (CLIA) as qualified to perform high complexity     clinical laboratory testing.     Performed at [facility],  BLOOD (ROUTINE X 2)     Status: None   Collection Time    01/29/14 11:40 AM      Result Value Ref Range Status   Specimen Description BLOOD RIGHT HAND   Final   Special Requests BOTTLES DRAWN AEROBIC ONLY 4CC   Final   Culture  Setup Time     Final   Value: 01/29/2014 16:40     Performed at [facility]   Culture     Final   Value:         BLOOD CULTURE RECEIVED NO GROWTH TO DATE CULTURE WILL BE HELD FOR 5 DAYS BEFORE ISSUING A FINAL NEGATIVE REPORT     Performed at [facility]   Report Status PENDING   Incomplete  CULTURE, BLOOD (ROUTINE X 2)     Status: None   Collection Time    01/29/14 11:46 AM      Result Value Ref Range Status   Specimen Description BLOOD RIGHT ARM   Final   Special Requests BOTTLES DRAWN AEROBIC ONLY 5CC   Final   Culture  Setup Time     Final   Value: 01/29/2014 16:41     Performed at [facility]   Culture     Final   Value:        BLOOD CULTURE RECEIVED NO GROWTH TO DATE CULTURE WILL BE HELD FOR 5 DAYS BEFORE ISSUING A FINAL NEGATIVE REPORT     Performed at [facility]   Report Status PENDING   Incomplete     Studies: Dg Chest Port 1 View  01/29/2014   CLINICAL DATA:  Fever  EXAM: PORTABLE CHEST - 1 VIEW  COMPARISON:  Jan 27, 2014  FINDINGS: There is airspace consolidation in the left base. The interstitium is diffusely prominent overall, in part due to shallow degree of inspiration. There may be some mild interstitial edema superimposed. Heart is upper normal in size with pulmonary vascularity within normal limits. There is atherosclerotic change in aorta. No adenopathy. Patient is status post kyphoplasty procedures in the lower thoracic region.  IMPRESSION: New consolidation left base. Aspiration could present in this manner.  Question mild interstitial edema superimposed. Some of the interstitial prominence is due to the overall shallow degree of inspiratory effort.   Electronically Signed   By: Lowella Grip M.D.   On: 01/29/2014 13:36   Dg Swallowing Func-speech Pathology  01/29/2014   Orbie Pyo Weatherly, CCC-SLP     01/29/2014  2:29 PM Objective Swallowing Evaluation: Modified Barium Swallowing Study   Patient Details  Name: [name] MRN: [id number] Date of Birth: 01-16-1919  Today's Date: 01/29/2014 Time: 68-1340 SLP Time Calculation (min): 15 min  Past  Medical History:  Past Medical History  Diagnosis Date  . Anemia   . Hyperlipidemia   . Hypertension   . Seizure disorder     poss from hponatremia when had pneumonia  . Recurrent UTI   . Urinary incontinence     mild  . Osteoporosis     Grade II spondylotistesis at l4-l5 testing in [place] 2008 and  confirmed that she has osteoporosis  . Spondylolisthesis of lumbar region     Grade II spondylotistesis at l4-l5 testing in [place] 2008   . Contusion of leg 08/06/2011  . Hematoma 08/06/2011    leg near shin no bov underlying bony abnormality and no  infection. should take a while to resolve observe for infection   . Chest wall  pain     "off and on" (05/26/2013)  . COPD (chronic obstructive pulmonary disease)   . Pleural effusion     "a few times" (05/26/2013)  . Seizures ~ 2009    "after pneumonia; electrolytes were all messed up" (05/26/2013)  . Pneumonia 09/2002; ~ 2009;   . Arthritis   . Osteoarthritis   . Thoracic compression fracture   . Thoracic back pain   . Skin cancer 2012    leg   . Dementia    Past Surgical History:  Past Surgical History  Procedure Laterality Date  . Abdominal hysterectomy    . Lumbar disc surgery    . Breast lumpectomy Bilateral      had fibrofatty tissue  . Cataract extraction w/ intraocular lens implant Left   . Glaucoma surgery Left   . Eye surgery    . Skin cancer excision  2012   HPI:  80 yo female admitted 01/27/14 due to fever, cough, and SOB.  PMH  significant for mild-moderate dementia, htn, hld, copd.  MBS  recommended to assess for silent aspiration and safest diet  texture and liquid recommendations.     Assessment / Plan / Recommendation Clinical Impression  Dysphagia Diagnosis: Mild oral phase dysphagia Clinical impression: Pt. exhibited a mild oral dysphagia  characterized by initial transit delays and prolonged mastication  with solids.  Pharyngeal phase was within functional limits for a  pt. of 80 years of age.  Swallow initiation was timely with flash  penetration x 1 with straw  (ie. barium briefly entered laryngeal  vestibule before exiting during the swallow).  Trace vallecular  residue which did not increase aspiration risk.  It is possible  pt. may have intermittent episodes of aspiration although today's  MBS appeared typical for pt. of same age.  Recommend continue Dys  3 due to current deconditioning and thin liquid via cup, no  straws, pills whole in applesauce, sit upright, small bites/sips.   ST can follow up for education with daughter on swallow  strategies x 1.      Treatment Recommendation  Therapy as outlined in treatment plan below    Diet Recommendation Dysphagia 3 (Mechanical Soft);Thin liquid   Liquid Administration via: Cup;No straw Medication Administration: Whole meds with puree Supervision: Patient able to self feed;Intermittent supervision  to cue for compensatory strategies Compensations: Slow rate;Small sips/bites Postural Changes and/or Swallow Maneuvers: Seated upright 90  degrees    Other  Recommendations Oral Care Recommendations: Oral care BID   Follow Up Recommendations  None    Frequency and Duration min 1 x/week  2 weeks   Pertinent Vitals/Pain WDL            Reason for Referral Objectively evaluate swallowing function   Oral Phase Oral Preparation/Oral Phase Oral Phase: Impaired Oral - Solids Oral - Puree: Delayed oral transit Oral - Regular: Delayed oral transit   Pharyngeal Phase Pharyngeal Phase Pharyngeal Phase: Impaired Pharyngeal - Honey Pharyngeal - Honey Teaspoon: Pharyngeal residue -  valleculae;Reduced tongue base retraction (trace) Pharyngeal - Nectar Pharyngeal - Nectar Cup: Pharyngeal residue - valleculae;Reduced  tongue base retraction (trace) Pharyngeal - Thin Pharyngeal - Thin Teaspoon: Within functional limits Pharyngeal - Thin Cup: Pharyngeal residue - valleculae;Reduced  tongue base retraction (trace) Pharyngeal - Thin Straw: Penetration/Aspiration during swallow Penetration/Aspiration details (thin straw): Material enters  airway,  remains ABOVE vocal cords then ejected out Pharyngeal - Solids Pharyngeal - Puree: Within functional limits  Cervical Esophageal Phase  Cervical Esophageal Phase Cervical Esophageal Phase: [facility]         Cranford Mon.Ed CCC-SLP Pager [phone]  01/29/2014    Scheduled Meds: . ampicillin-sulbactam (UNASYN) IV  3 g Intravenous Q6H  . antiseptic oral rinse  15 mL Mouth Rinse q12n4p  . aspirin EC  81 mg Oral QODAY  . azithromycin  500 mg Intravenous Q24H  . chlorhexidine  15 mL Mouth Rinse BID  . donepezil  10 mg Oral Q supper  . enoxaparin (LOVENOX) injection  40 mg Subcutaneous Q24H   Continuous Infusions:   Antibiotics Given (last 72 hours)   Date/Time Action Medication Dose Rate   01/29/14 1207 Given   Ampicillin-Sulbactam (UNASYN) 3 g in sodium chloride 0.9 % 100 mL IVPB 3 g 100 mL/hr   01/29/14 1712 Given   Ampicillin-Sulbactam (UNASYN) 3 g in sodium chloride 0.9 % 100 mL IVPB 3 g 100 mL/hr   01/30/14 0023 Given   Ampicillin-Sulbactam (UNASYN) 3 g in sodium chloride 0.9 % 100 mL IVPB 3 g 100 mL/hr   01/30/14 0523 Given   Ampicillin-Sulbactam (UNASYN) 3 g in sodium chloride 0.9 % 100 mL IVPB 3 g 100 mL/hr   01/30/14 1225 Given   Ampicillin-Sulbactam (UNASYN) 3 g in sodium chloride 0.9 % 100 mL IVPB 3 g 100 mL/hr   01/30/14 1815 Given   Ampicillin-Sulbactam (UNASYN) 3 g in sodium chloride 0.9 % 100 mL IVPB 3 g 100 mL/hr      Time spent: 35 min  Delfina Redwood, MD  [facility] Pager [phone]. If 7PM-7AM, please contact night-coverage at [email or web address], password [facility] 01/30/2014, 8:26 PM  LOS: 3 days

## 2014-01-30 NOTE — Progress Notes (Signed)
Physical Therapy Treatment Patient Details Name: [name] MRN: [id number] DOB: 03-13-1919 Today's Date: 01/30/2014    History of Present Illness Pt admit for PNA.      PT Comments    Pt admitted with above. Pt currently with functional limitations due to balance and endurance deficits. Pt will benefit from skilled PT to increase their independence and safety with mobility to allow discharge to the venue listed below.  Follow Up Recommendations  Home health PT;Supervision/Assistance - 24 hour     Equipment Recommendations   (home O2 possibly)    Recommendations for Other Services       Precautions / Restrictions Precautions Precautions: Fall Restrictions Weight Bearing Restrictions: No    Mobility  Bed Mobility Overal bed mobility: Needs Assistance;+2 for physical assistance Bed Mobility: Sit to Supine       Sit to supine: Mod assist   General bed mobility comments: Needed assist to place LEs on bed and to control descent of trunk.  Pt fatigued and laid down quickly.   Transfers Overall transfer level: Needs assistance Equipment used: Rolling walker (2 wheeled) Transfers: Sit to/from Stand Sit to Stand: Max assist         General transfer comment: Pt needed alot of assist for power up.  Pt with flexed posture more than last treatment.    Ambulation/Gait Ambulation/Gait assistance: Mod assist;+2 physical assistance Ambulation Distance (Feet): 16 Feet (8 feet, 4 feet and 4 feet) Assistive device: 2 person hand held assist Gait Pattern/deviations: Step-through pattern;Decreased step length - right;Decreased step length - left;Narrow base of support;Trunk flexed;Leaning posteriorly;Antalgic;Shuffle   Gait velocity interpretation: Below normal speed for age/gender General Gait Details: Pt ambulated with poor postural stability overall.  Pt stated she needed to use the bathroom and she began to sit in a chair that was close by.  Obtained a 3N1 and assisted pt to  3N1 with mod assist.  Had to change pts gown as it was wet.  Total assist to clean pt after she urinated in 3N1.  Pt had to sit and rest 2 x prior to getting back to bed.    Pt unsteady and as she fatigues, she flexes her hips, knees and trunk which makes it harder for her to ambulate.  Once in bed, pt c/o chest pain.  Called nursing who came in to assess pt.  By the time nursing came in, chest pain was resolving.  Pt had removed O2 on arrival and her O2 was at 82% on RA therefore this PT replaced O2 at 2L and sats 94% and >.  Notified nursing that PT had to replace O2 and that pt had taken it off as well as desaturation.     Stairs            Wheelchair Mobility    Modified Rankin (Stroke Patients Only)       Balance Overall balance assessment: Needs assistance;History of Falls Sitting-balance support: Bilateral upper extremity supported;Feet supported Sitting balance-Leahy Scale: Poor Sitting balance - Comments: Needs min guard assist to sit EOB.   Postural control: Posterior lean Standing balance support: Bilateral upper extremity supported;During functional activity Standing balance-Leahy Scale: Poor Standing balance comment: Needs incr assist for standing balance.                     Cognition Arousal/Alertness: Awake/alert Behavior During Therapy: WFL for tasks assessed/performed Overall Cognitive Status: History of cognitive impairments - at baseline       Memory:  Decreased short-term memory              Exercises General Exercises - Lower Extremity Ankle Circles/Pumps: AROM;Both;5 reps;Supine Long Arc Quad: AROM;Both;10 reps;Seated    General Comments        Pertinent Vitals/Pain VSS, No pain    Home Living                      Prior Function            PT Goals (current goals can now be found in the care plan section) Progress towards PT goals: Progressing toward goals    Frequency  Min 3X/week    PT Plan Current plan  remains appropriate    Co-evaluation             End of Session Equipment Utilized During Treatment: Gait belt;Oxygen Activity Tolerance: Patient limited by fatigue Patient left: in chair;with call bell/phone within reach     Time: 1413-1438 PT Time Calculation (min): 25 min  Charges:  $Gait Training: 8-22 mins $Self Care/Home Management: 8-22                    G Codes:      Christianne Dolin Feb 01, 2014, 4:31 PM [facility] [phone] (pager)

## 2014-01-30 NOTE — Progress Notes (Signed)
SATURATION QUALIFICATIONS: (This note is used to comply with regulatory documentation for home oxygen)  Patient Saturations on Room Air at Rest = 85%  Patient Saturations on Room Air while Ambulating = 82%  Patient Saturations on 2 Liters of oxygen while Ambulating = 91%  Please briefly explain why patient needs home oxygen: Pt desats on RA at rest and with ambulation.  Will need O2 at home.  Thanks [facility] [phone] (pager)

## 2014-01-31 LAB — GLUCOSE, CAPILLARY
GLUCOSE-CAPILLARY: 100 mg/dL — AB (ref 70–99)
GLUCOSE-CAPILLARY: 106 mg/dL — AB (ref 70–99)
Glucose-Capillary: 113 mg/dL — ABNORMAL HIGH (ref 70–99)

## 2014-01-31 NOTE — Progress Notes (Signed)
PROGRESS NOTE  [name] KPQ:[id number] DOB: [birth year] DOA: 01/27/2014 PCP: [name], MD  Assessment/Plan: Pneumonia, possibly aspiration related. Xray confirms. Fevers trending down on current abx. May need home oxygen. Continue current for now. Likely change to po abx tomorrow  DEMENTIA  Hypokalemia: better. Still low. Replete. mag ok  HYPERTENSION   SEIZURE DISORDER   Hyponatremia stable  PT eval pending  Code Status: DNR Family Communication: daughter at bedside Disposition Plan: inpt with home health   Consultants:  none  Procedures:  none  HPI/Subjective: No complaints  Objective: Filed Vitals:   01/31/14 1341  BP: 126/48  Pulse: 99  Temp: 98.8 F (37.1 C)  Resp: 18    Intake/Output Summary (Last 24 hours) at 01/31/14 1422 Last data filed at 01/31/14 1300  Gross per 24 hour  Intake    480 ml  Output    700 ml  Net   -220 ml   Filed Weights   01/28/14 0040  Weight: 61 kg (134 lb 7.7 oz)    Exam:   General:  Asleep. arousable  Cardiovascular: rrr without MGR  Respiratory: CTA without WRR  Abdomen: +Bs, soft  Ext no CCE  Data Reviewed: Basic Metabolic Panel:  Recent Labs Lab 01/27/14 1954 01/28/14 0340 01/29/14 0355 01/29/14 1145 01/30/14 0617  NA 130* 135* 133*  --  136*  K 3.9 3.4* 3.1*  --  3.5*  CL 92* 102 98  --  100  CO2 _0 --  24  GLUCOSE 250* 124* 125*  --  101*  BUN _1 --  12  CREATININE 0.67 0.65 0.67  --  0.52  CALCIUM 9.1 7.6* 8.0*  --  8.2*  MG  --   --   --  1.9  --    Liver Function Tests:  Recent Labs Lab 01/27/14 1954  AST 27  ALT 12  ALKPHOS 90  BILITOT 0.2*  PROT 8.3  ALBUMIN 3.4*   No results found for this basename: LIPASE, AMYLASE,  in the last 168 hours No results found for this basename: AMMONIA,  in the last 168 hours CBC:  Recent Labs Lab 01/27/14 1954 01/28/14 0340 01/29/14 0355 01/30/14 0617  WBC 15.5* 11.7* 13.5* 11.5*  NEUTROABS 14.4* 9.8*  --   10.0*  HGB 13.6 10.6* 10.2* 10.1*  HCT 39.0 33.1* 30.0* 30.2*  MCV 88.6 89.7 88.8 88.3  PLT 214 177 170 178   Cardiac Enzymes:  Recent Labs Lab 01/29/14 1832 01/30/14 0112 01/30/14 0617  TROPONINI <0.30 <0.30 <0.30   BNP (last 3 results)  Recent Labs  05/27/13 0100 05/28/13 0602  PROBNP 551.6* 338.4   CBG:  Recent Labs Lab 01/29/14 0635 01/29/14 1102 01/29/14 1611 01/29/14 2119 01/31/14 1202  GLUCAP 111* 144* 131* 127* 100*    Recent Results (from the past 240 hour(s))  CULTURE, BLOOD (ROUTINE X 2)     Status: None   Collection Time    01/27/14  7:54 PM      Result Value Ref Range Status   Specimen Description BLOOD RIGHT FOREARM   Final   Special Requests BOTTLES DRAWN AEROBIC AND ANAEROBIC 5CC   Final   Culture  Setup Time     Final   Value: 01/28/2014 00:27     Performed at [facility]   Culture     Final   Value:        BLOOD CULTURE RECEIVED NO GROWTH TO DATE CULTURE WILL  BE HELD FOR 5 DAYS BEFORE ISSUING A FINAL NEGATIVE REPORT     Performed at [facility]   Report Status PENDING   Incomplete  CULTURE, BLOOD (ROUTINE X 2)     Status: None   Collection Time    01/27/14  7:59 PM      Result Value Ref Range Status   Specimen Description BLOOD HAND RIGHT   Final   Special Requests BOTTLES DRAWN AEROBIC ONLY 5CC   Final   Culture  Setup Time     Final   Value: 01/28/2014 00:26     Performed at [facility]   Culture     Final   Value:        BLOOD CULTURE RECEIVED NO GROWTH TO DATE CULTURE WILL BE HELD FOR 5 DAYS BEFORE ISSUING A FINAL NEGATIVE REPORT     Performed at [facility]   Report Status PENDING   Incomplete  URINE CULTURE     Status: None   Collection Time    01/27/14  9:34 PM      Result Value Ref Range Status   Specimen Description URINE, CATHETERIZED   Final   Special Requests NONE   Final   Culture  Setup Time     Final   Value: 01/27/2014 22:46     Performed at [place]      Final   Value: NO GROWTH     Performed at [facility]   Culture     Final   Value: NO GROWTH     Performed at [facility]   Report Status 01/28/2014 FINAL   Final  RESPIRATORY VIRUS PANEL     Status: None   Collection Time    01/28/14  6:55 PM      Result Value Ref Range Status   Source - RVPAN NASAL SWAB   Corrected   Comment: CORRECTED ON 05/28 AT 1813: PREVIOUSLY REPORTED AS NASAL SWAB   Respiratory Syncytial Virus A NOT DETECTED   Final   Respiratory Syncytial Virus B NOT DETECTED   Final   Influenza A NOT DETECTED   Final   Influenza B NOT DETECTED   Final   Parainfluenza 1 NOT DETECTED   Final   Parainfluenza 2 NOT DETECTED   Final   Parainfluenza 3 NOT DETECTED   Final   Metapneumovirus NOT DETECTED   Final   Rhinovirus NOT DETECTED   Final   Adenovirus NOT DETECTED   Final   Influenza A H1 NOT DETECTED   Final   Influenza A H3 NOT DETECTED   Final   Comment: (NOTE)           Normal Reference Range for each Analyte: NOT DETECTED     Testing performed using the Luminex xTAG Respiratory Viral Panel test     kit.     This test was developed and its performance characteristics determined     by [facility]. It has not been cleared or approved by the [place]     Food and Drug Administration. This test is used for clinical purposes.     It should not be regarded as investigational or for research. This     laboratory is certified under the [place] (CLIA) as qualified to perform high complexity     clinical laboratory testing.     Performed at [facility]  CULTURE, BLOOD (ROUTINE X 2)     Status: None   Collection Time    01/29/14 11:40 AM      Result Value Ref Range Status   Specimen Description BLOOD RIGHT HAND   Final   Special Requests BOTTLES DRAWN AEROBIC ONLY 4CC   Final   Culture  Setup Time     Final   Value: 01/29/2014 16:40     Performed at [facility]   Culture      Final   Value:        BLOOD CULTURE RECEIVED NO GROWTH TO DATE CULTURE WILL BE HELD FOR 5 DAYS BEFORE ISSUING A FINAL NEGATIVE REPORT     Performed at [facility]   Report Status PENDING   Incomplete  CULTURE, BLOOD (ROUTINE X 2)     Status: None   Collection Time    01/29/14 11:46 AM      Result Value Ref Range Status   Specimen Description BLOOD RIGHT ARM   Final   Special Requests BOTTLES DRAWN AEROBIC ONLY 5CC   Final   Culture  Setup Time     Final   Value: 01/29/2014 16:41     Performed at [facility]   Culture     Final   Value:        BLOOD CULTURE RECEIVED NO GROWTH TO DATE CULTURE WILL BE HELD FOR 5 DAYS BEFORE ISSUING A FINAL NEGATIVE REPORT     Performed at [facility]   Report Status PENDING   Incomplete     Studies: No results found.  Scheduled Meds: . ampicillin-sulbactam (UNASYN) IV  3 g Intravenous Q6H  . antiseptic oral rinse  15 mL Mouth Rinse q12n4p  . aspirin EC  81 mg Oral QODAY  . azithromycin  500 mg Intravenous Q24H  . chlorhexidine  15 mL Mouth Rinse BID  . donepezil  10 mg Oral Q supper  . enoxaparin (LOVENOX) injection  40 mg Subcutaneous Q24H   Continuous Infusions:   Antibiotics Given (last 72 hours)   Date/Time Action Medication Dose Rate   01/29/14 1207 Given   Ampicillin-Sulbactam (UNASYN) 3 g in sodium chloride 0.9 % 100 mL IVPB 3 g 100 mL/hr   01/29/14 1712 Given   Ampicillin-Sulbactam (UNASYN) 3 g in sodium chloride 0.9 % 100 mL IVPB 3 g 100 mL/hr   01/30/14 0023 Given   Ampicillin-Sulbactam (UNASYN) 3 g in sodium chloride 0.9 % 100 mL IVPB 3 g 100 mL/hr   01/30/14 0523 Given   Ampicillin-Sulbactam (UNASYN) 3 g in sodium chloride 0.9 % 100 mL IVPB 3 g 100 mL/hr   01/30/14 1225 Given   Ampicillin-Sulbactam (UNASYN) 3 g in sodium chloride 0.9 % 100 mL IVPB 3 g 100 mL/hr   01/30/14 1815 Given   Ampicillin-Sulbactam (UNASYN) 3 g in sodium chloride 0.9 % 100 mL IVPB 3 g 100 mL/hr   01/31/14 0100 Given    Ampicillin-Sulbactam (UNASYN) 3 g in sodium chloride 0.9 % 100 mL IVPB 3 g 100 mL/hr   01/31/14 0504 Given   Ampicillin-Sulbactam (UNASYN) 3 g in sodium chloride 0.9 % 100 mL IVPB 3 g 100 mL/hr   01/31/14 1202 Given   Ampicillin-Sulbactam (UNASYN) 3 g in sodium chloride 0.9 % 100 mL IVPB 3 g 100 mL/hr      Time spent: 15 min  Delfina Redwood, MD  [facility] Pager [phone]. If 7PM-7AM, please contact night-coverage at [email or web address], password [facility] 01/31/2014, 2:22  PM  LOS: 4 days

## 2014-02-01 LAB — GLUCOSE, CAPILLARY
GLUCOSE-CAPILLARY: 106 mg/dL — AB (ref 70–99)
Glucose-Capillary: 103 mg/dL — ABNORMAL HIGH (ref 70–99)
Glucose-Capillary: 129 mg/dL — ABNORMAL HIGH (ref 70–99)

## 2014-02-01 MED ORDER — AZITHROMYCIN 250 MG PO TABS
250.0000 mg | ORAL_TABLET | Freq: Every day | ORAL | Status: DC
  Administered 2014-02-02 – 2014-02-03 (×2): 250 mg via ORAL
  Filled 2014-02-01 (×2): qty 1

## 2014-02-01 MED ORDER — AMOXICILLIN-POT CLAVULANATE 500-125 MG PO TABS
1.0000 | ORAL_TABLET | Freq: Two times a day (BID) | ORAL | Status: DC
  Administered 2014-02-01 – 2014-02-03 (×5): 500 mg via ORAL
  Filled 2014-02-01 (×6): qty 1

## 2014-02-01 NOTE — Progress Notes (Signed)
PROGRESS NOTE  [name] QBH:[id number] DOB: Dec 19, 1918 DOA: 01/27/2014 PCP: [name], MD  Assessment/Plan: Pneumonia, possibly aspiration related. Change to PO abx. Repeat cxr in am. Repeat labs in am. OOB to chair with meals  DEMENTIA  Hypokalemia: better.   HYPERTENSION   SEIZURE DISORDER   Hyponatremia stable    Code Status: DNR Family Communication: SIL by phone Disposition Plan: home with home health   Consultants:  none  Procedures:  none  HPI/Subjective: No complaints  Objective: Filed Vitals:   02/01/14 0500  BP: 136/84  Pulse: 104  Temp: 100 F (37.8 C)  Resp: 21    Intake/Output Summary (Last 24 hours) at 02/01/14 1425 Last data filed at 02/01/14 0600  Gross per 24 hour  Intake    450 ml  Output    500 ml  Net    -50 ml   Filed Weights   01/28/14 0040  Weight: 61 kg (134 lb 7.7 oz)    Exam:   General:  Asleep. arousable  Cardiovascular: rrr without MGR  Respiratory: CTA without WRR  Abdomen: +Bs, soft  Ext no CCE  Data Reviewed: Basic Metabolic Panel:  Recent Labs Lab 01/27/14 1954 01/28/14 0340 01/29/14 0355 01/29/14 1145 01/30/14 0617  NA 130* 135* 133*  --  136*  K 3.9 3.4* 3.1*  --  3.5*  CL 92* 102 98  --  100  CO2 _0 --  24  GLUCOSE 250* 124* 125*  --  101*  BUN _1 --  12  CREATININE 0.67 0.65 0.67  --  0.52  CALCIUM 9.1 7.6* 8.0*  --  8.2*  MG  --   --   --  1.9  --    Liver Function Tests:  Recent Labs Lab 01/27/14 1954  AST 27  ALT 12  ALKPHOS 90  BILITOT 0.2*  PROT 8.3  ALBUMIN 3.4*   No results found for this basename: LIPASE, AMYLASE,  in the last 168 hours No results found for this basename: AMMONIA,  in the last 168 hours CBC:  Recent Labs Lab 01/27/14 1954 01/28/14 0340 01/29/14 0355 01/30/14 0617  WBC 15.5* 11.7* 13.5* 11.5*  NEUTROABS 14.4* 9.8*  --  10.0*  HGB 13.6 10.6* 10.2* 10.1*  HCT 39.0 33.1* 30.0* 30.2*  MCV 88.6 89.7 88.8 88.3  PLT 214  177 170 178   Cardiac Enzymes:  Recent Labs Lab 01/29/14 1832 01/30/14 0112 01/30/14 0617  TROPONINI <0.30 <0.30 <0.30   BNP (last 3 results)  Recent Labs  05/27/13 0100 05/28/13 0602  PROBNP 551.6* 338.4   CBG:  Recent Labs Lab 01/29/14 2119 01/31/14 1202 01/31/14 1650 01/31/14 2110 02/01/14 0618  GLUCAP 127* 100* 113* 106* 106*    Recent Results (from the past 240 hour(s))  CULTURE, BLOOD (ROUTINE X 2)     Status: None   Collection Time    01/27/14  7:54 PM      Result Value Ref Range Status   Specimen Description BLOOD RIGHT FOREARM   Final   Special Requests BOTTLES DRAWN AEROBIC AND ANAEROBIC 5CC   Final   Culture  Setup Time     Final   Value: 01/28/2014 00:27     Performed at [facility]   Culture     Final   Value:        BLOOD CULTURE RECEIVED NO GROWTH TO DATE CULTURE WILL BE HELD FOR 5 DAYS BEFORE ISSUING A FINAL NEGATIVE  REPORT     Performed at [facility]   Report Status PENDING   Incomplete  CULTURE, BLOOD (ROUTINE X 2)     Status: None   Collection Time    01/27/14  7:59 PM      Result Value Ref Range Status   Specimen Description BLOOD HAND RIGHT   Final   Special Requests BOTTLES DRAWN AEROBIC ONLY 5CC   Final   Culture  Setup Time     Final   Value: 01/28/2014 00:26     Performed at [facility]   Culture     Final   Value:        BLOOD CULTURE RECEIVED NO GROWTH TO DATE CULTURE WILL BE HELD FOR 5 DAYS BEFORE ISSUING A FINAL NEGATIVE REPORT     Performed at [facility]   Report Status PENDING   Incomplete  URINE CULTURE     Status: None   Collection Time    01/27/14  9:34 PM      Result Value Ref Range Status   Specimen Description URINE, CATHETERIZED   Final   Special Requests NONE   Final   Culture  Setup Time     Final   Value: 01/27/2014 22:46     Performed at [place]     Final   Value: NO GROWTH     Performed at [facility]   Culture     Final   Value:  NO GROWTH     Performed at [facility]   Report Status 01/28/2014 FINAL   Final  RESPIRATORY VIRUS PANEL     Status: None   Collection Time    01/28/14  6:55 PM      Result Value Ref Range Status   Source - RVPAN NASAL SWAB   Corrected   Comment: CORRECTED ON 05/28 AT 1813: PREVIOUSLY REPORTED AS NASAL SWAB   Respiratory Syncytial Virus A NOT DETECTED   Final   Respiratory Syncytial Virus B NOT DETECTED   Final   Influenza A NOT DETECTED   Final   Influenza B NOT DETECTED   Final   Parainfluenza 1 NOT DETECTED   Final   Parainfluenza 2 NOT DETECTED   Final   Parainfluenza 3 NOT DETECTED   Final   Metapneumovirus NOT DETECTED   Final   Rhinovirus NOT DETECTED   Final   Adenovirus NOT DETECTED   Final   Influenza A H1 NOT DETECTED   Final   Influenza A H3 NOT DETECTED   Final   Comment: (NOTE)           Normal Reference Range for each Analyte: NOT DETECTED     Testing performed using the Luminex xTAG Respiratory Viral Panel test     kit.     This test was developed and its performance characteristics determined     by [facility]. It has not been cleared or approved by the [place]     Food and Drug Administration. This test is used for clinical purposes.     It should not be regarded as investigational or for research. This     laboratory is certified under the [place] (CLIA) as qualified to perform high complexity     clinical laboratory testing.     Performed at [place], BLOOD (ROUTINE X 2)     Status:  None   Collection Time    01/29/14 11:40 AM      Result Value Ref Range Status   Specimen Description BLOOD RIGHT HAND   Final   Special Requests BOTTLES DRAWN AEROBIC ONLY 4CC   Final   Culture  Setup Time     Final   Value: 01/29/2014 16:40     Performed at [facility]   Culture     Final   Value:        BLOOD CULTURE RECEIVED NO GROWTH TO DATE CULTURE WILL BE HELD FOR 5 DAYS BEFORE  ISSUING A FINAL NEGATIVE REPORT     Performed at [facility]   Report Status PENDING   Incomplete  CULTURE, BLOOD (ROUTINE X 2)     Status: None   Collection Time    01/29/14 11:46 AM      Result Value Ref Range Status   Specimen Description BLOOD RIGHT ARM   Final   Special Requests BOTTLES DRAWN AEROBIC ONLY 5CC   Final   Culture  Setup Time     Final   Value: 01/29/2014 16:41     Performed at [facility]   Culture     Final   Value:        BLOOD CULTURE RECEIVED NO GROWTH TO DATE CULTURE WILL BE HELD FOR 5 DAYS BEFORE ISSUING A FINAL NEGATIVE REPORT     Performed at [facility]   Report Status PENDING   Incomplete     Studies: No results found.  Scheduled Meds: . ampicillin-sulbactam (UNASYN) IV  3 g Intravenous Q6H  . antiseptic oral rinse  15 mL Mouth Rinse q12n4p  . aspirin EC  81 mg Oral QODAY  . azithromycin  500 mg Intravenous Q24H  . chlorhexidine  15 mL Mouth Rinse BID  . donepezil  10 mg Oral Q supper  . enoxaparin (LOVENOX) injection  40 mg Subcutaneous Q24H   Continuous Infusions:   Antibiotics Given (last 72 hours)   Date/Time Action Medication Dose Rate   01/29/14 1712 Given   Ampicillin-Sulbactam (UNASYN) 3 g in sodium chloride 0.9 % 100 mL IVPB 3 g 100 mL/hr   01/30/14 0023 Given   Ampicillin-Sulbactam (UNASYN) 3 g in sodium chloride 0.9 % 100 mL IVPB 3 g 100 mL/hr   01/30/14 0523 Given   Ampicillin-Sulbactam (UNASYN) 3 g in sodium chloride 0.9 % 100 mL IVPB 3 g 100 mL/hr   01/30/14 1225 Given   Ampicillin-Sulbactam (UNASYN) 3 g in sodium chloride 0.9 % 100 mL IVPB 3 g 100 mL/hr   01/30/14 1815 Given   Ampicillin-Sulbactam (UNASYN) 3 g in sodium chloride 0.9 % 100 mL IVPB 3 g 100 mL/hr   01/31/14 0100 Given   Ampicillin-Sulbactam (UNASYN) 3 g in sodium chloride 0.9 % 100 mL IVPB 3 g 100 mL/hr   01/31/14 0504 Given   Ampicillin-Sulbactam (UNASYN) 3 g in sodium chloride 0.9 % 100 mL IVPB 3 g 100 mL/hr   01/31/14 1202 Given    Ampicillin-Sulbactam (UNASYN) 3 g in sodium chloride 0.9 % 100 mL IVPB 3 g 100 mL/hr   01/31/14 1700 Given   Ampicillin-Sulbactam (UNASYN) 3 g in sodium chloride 0.9 % 100 mL IVPB 3 g 100 mL/hr   02/01/14 0040 Given   Ampicillin-Sulbactam (UNASYN) 3 g in sodium chloride 0.9 % 100 mL IVPB 3 g 100 mL/hr   02/01/14 0600 Given   Ampicillin-Sulbactam (UNASYN) 3 g in sodium chloride 0.9 % 100  mL IVPB 3 g 100 mL/hr   02/01/14 1253 Given   Ampicillin-Sulbactam (UNASYN) 3 g in sodium chloride 0.9 % 100 mL IVPB 3 g 100 mL/hr      Time spent: 25 min  Delfina Redwood, MD  [facility] Pager [phone]. If 7PM-7AM, please contact night-coverage at [email or web address], password [facility] 02/01/2014, 2:25 PM  LOS: 5 days

## 2014-02-02 ENCOUNTER — Inpatient Hospital Stay (HOSPITAL_COMMUNITY): Payer: Medicare Other

## 2014-02-02 LAB — CBC
HEMATOCRIT: 29.9 % — AB (ref 36.0–46.0)
Hemoglobin: 9.8 g/dL — ABNORMAL LOW (ref 12.0–15.0)
MCH: 29.3 pg (ref 26.0–34.0)
MCHC: 32.8 g/dL (ref 30.0–36.0)
MCV: 89.3 fL (ref 78.0–100.0)
Platelets: 261 10*3/uL (ref 150–400)
RBC: 3.35 MIL/uL — ABNORMAL LOW (ref 3.87–5.11)
RDW: 13.4 % (ref 11.5–15.5)
WBC: 8.9 10*3/uL (ref 4.0–10.5)

## 2014-02-02 LAB — GLUCOSE, CAPILLARY
GLUCOSE-CAPILLARY: 97 mg/dL (ref 70–99)
GLUCOSE-CAPILLARY: 99 mg/dL (ref 70–99)
GLUCOSE-CAPILLARY: 141 mg/dL — AB (ref 70–99)

## 2014-02-02 LAB — BASIC METABOLIC PANEL
BUN: 12 mg/dL (ref 6–23)
CO2: 27 mEq/L (ref 19–32)
CREATININE: 0.51 mg/dL (ref 0.50–1.10)
Calcium: 8.4 mg/dL (ref 8.4–10.5)
Chloride: 102 mEq/L (ref 96–112)
GFR, EST NON AFRICAN AMERICAN: 80 mL/min — AB (ref >90)
Glucose, Bld: 99 mg/dL (ref 70–99)
Potassium: 2.9 mEq/L — CL (ref 3.7–5.3)
Sodium: 140 mEq/L (ref 137–147)

## 2014-02-02 LAB — PRO B NATRIURETIC PEPTIDE: Pro B Natriuretic peptide (BNP): 3497 pg/mL — ABNORMAL HIGH (ref 0–450)

## 2014-02-02 LAB — LACTATE DEHYDROGENASE: LDH: 250 U/L (ref 94–250)

## 2014-02-02 MED ORDER — POTASSIUM CHLORIDE 10 MEQ/100ML IV SOLN
10.0000 meq | INTRAVENOUS | Status: DC
  Administered 2014-02-02: 10 meq via INTRAVENOUS
  Filled 2014-02-02 (×3): qty 100

## 2014-02-02 MED ORDER — POTASSIUM CHLORIDE 10 MEQ/100ML IV SOLN
10.0000 meq | INTRAVENOUS | Status: AC
  Administered 2014-02-02 (×3): 10 meq via INTRAVENOUS
  Filled 2014-02-02 (×4): qty 100

## 2014-02-02 NOTE — Progress Notes (Signed)
Patient ID: [name], female   DOB: 1919-05-22, 80 y.o.   MRN: [id number]   Request made for L thoracentesis  Limited US was performed over L chest VERY small amount of effusion noted Riskful with such a small effusion  Called Dr  Conley Canal Discussed with her Could attempt thora to obtain small amt of fluid, But she agreed risk vs benefits was high. Will hold off on thoracentesis for now Re order if effusion becomes larger.  Also noted: pt complained of "back itching" Examination does show reddened macules over back   MD may want to address this

## 2014-02-02 NOTE — Progress Notes (Signed)
Speech Language Pathology Treatment: Dysphagia  Patient Details Name: [name] MRN: [id number] DOB: 01/10/1919 Today's Date: 02/02/2014 Time: 1327-1335 SLP Time Calculation (min): 8 min  Assessment / Plan / Recommendation Clinical Impression  Brief observation for safety and efficiency with Dys 3, thin.  Caregiver reports pt. Masticating without difficulties.  SLP educated caregiver on clinical rationale for cup sips only.  No indications of penetration or aspiration.  Reviewed swallow precautions.  Recommend continue Dys 3 diet and thin liquids. No follow up needed.      HPI HPI: 80 yo female admitted 01/27/14 due to fever, cough, and SOB.  PMH significant for mild-moderate dementia, htn, hld, copd.  MBS recommended to assess for silent aspiration and safest diet texture and liquid recommendations.   Pertinent Vitals WDL  SLP Plan  All goals met    Recommendations Diet recommendations: Dysphagia 3 (mechanical soft);Thin liquid Liquids provided via: Cup;No straw Medication Administration: Whole meds with puree Supervision: Patient able to self feed;Full supervision/cueing for compensatory strategies;Staff to assist with self feeding Compensations: Slow rate;Small sips/bites Postural Changes and/or Swallow Maneuvers: Seated upright 90 degrees              Oral Care Recommendations: Oral care BID Follow up Recommendations: None Plan: All goals met    GO      Willis  M.Ed CCC-SLP Pager [phone]  02/02/2014    

## 2014-02-02 NOTE — Progress Notes (Signed)
PROGRESS NOTE  [name] OZH:[id number] DOB: 1919-07-20 DOA: 01/27/2014 PCP: [name], MD  Assessment/Plan: Pneumonia, possibly aspiration related. Tolerating oral abx. Afebrile and WBC now normal.  CXR shows possibly increased left pleural effusion.  Discussed with Dr. Annamaria Boots, son in law. Will see whether enough fluid to tap. Discussed with Jannifer Franklin, PA, and placed order.  Check p BNP as well.  Has not received Lovenox since yesterday. Will hold and place SCDs.  DEMENTIA  Hypokalemia, recurrent. Replete IV. mag ok a few days ago.  HYPERTENSION   SEIZURE DISORDER   Hyponatremia resolved  Code Status: DNR Family Communication: SIL by phone Disposition Plan: home with home health   Consultants:  Interventional radiology  Procedures:  none  Antibiotics Azithromycin 5/26- Rocephin 5/26-5/28 Unasyn 5/28-5/31 augmentin 5/31 -   HPI/Subjective: No complaints  Objective: Filed Vitals:   02/02/14 0615  BP: 132/59  Pulse: 87  Temp: 98.2 F (36.8 C)  Resp: 22   No intake or output data in the 24 hours ending 02/02/14 1022 Filed Weights   01/28/14 0040  Weight: 61 kg (134 lb 7.7 oz)    Exam:   General:  Asleep. arousable. In chair.  Cardiovascular: rrr without MGR  Respiratory: CTA without WRR  Abdomen: +Bs, soft  Ext no CCE  Data Reviewed: Basic Metabolic Panel:  Recent Labs Lab 01/27/14 1954 01/28/14 0340 01/29/14 0355 01/29/14 1145 01/30/14 0617 02/02/14 0428  NA 130* 135* 133*  --  136* 140  K 3.9 3.4* 3.1*  --  3.5* 2.9*  CL 92* 102 98  --  100 102  CO2 24 21 23   --  24 27  GLUCOSE 250* 124* 125*  --  101* 99  BUN 19 13 11   --  12 12  CREATININE 0.67 0.65 0.67  --  0.52 0.51  CALCIUM 9.1 7.6* 8.0*  --  8.2* 8.4  MG  --   --   --  1.9  --   --    Liver Function Tests:  Recent Labs Lab 01/27/14 1954  AST 27  ALT 12  ALKPHOS 90  BILITOT 0.2*  PROT 8.3  ALBUMIN 3.4*   No results found for this basename: LIPASE,  AMYLASE,  in the last 168 hours No results found for this basename: AMMONIA,  in the last 168 hours CBC:  Recent Labs Lab 01/27/14 1954 01/28/14 0340 01/29/14 0355 01/30/14 0617 02/02/14 0428  WBC 15.5* 11.7* 13.5* 11.5* 8.9  NEUTROABS 14.4* 9.8*  --  10.0*  --   HGB 13.6 10.6* 10.2* 10.1* 9.8*  HCT 39.0 33.1* 30.0* 30.2* 29.9*  MCV 88.6 89.7 88.8 88.3 89.3  PLT 214 177 170 178 261   Cardiac Enzymes:  Recent Labs Lab 01/29/14 1832 01/30/14 0112 01/30/14 0617  TROPONINI <0.30 <0.30 <0.30   BNP (last 3 results)  Recent Labs  05/27/13 0100 05/28/13 0602  PROBNP 551.6* 338.4   CBG:  Recent Labs Lab 01/31/14 2110 02/01/14 0618 02/01/14 1620 02/01/14 2105 02/02/14 0744  GLUCAP 106* 106* 103* 129* 97    Recent Results (from the past 240 hour(s))  CULTURE, BLOOD (ROUTINE X 2)     Status: None   Collection Time    01/27/14  7:54 PM      Result Value Ref Range Status   Specimen Description BLOOD RIGHT FOREARM   Final   Special Requests BOTTLES DRAWN AEROBIC AND ANAEROBIC 5CC   Final   Culture  Setup Time  Final   Value: 01/28/2014 00:27     Performed at [facility]   Culture     Final   Value:        BLOOD CULTURE RECEIVED NO GROWTH TO DATE CULTURE WILL BE HELD FOR 5 DAYS BEFORE ISSUING A FINAL NEGATIVE REPORT     Performed at [facility]   Report Status PENDING   Incomplete  CULTURE, BLOOD (ROUTINE X 2)     Status: None   Collection Time    01/27/14  7:59 PM      Result Value Ref Range Status   Specimen Description BLOOD HAND RIGHT   Final   Special Requests BOTTLES DRAWN AEROBIC ONLY 5CC   Final   Culture  Setup Time     Final   Value: 01/28/2014 00:26     Performed at [facility]   Culture     Final   Value:        BLOOD CULTURE RECEIVED NO GROWTH TO DATE CULTURE WILL BE HELD FOR 5 DAYS BEFORE ISSUING A FINAL NEGATIVE REPORT     Performed at [facility]   Report Status PENDING   Incomplete  URINE CULTURE      Status: None   Collection Time    01/27/14  9:34 PM      Result Value Ref Range Status   Specimen Description URINE, CATHETERIZED   Final   Special Requests NONE   Final   Culture  Setup Time     Final   Value: 01/27/2014 22:46     Performed at [facility]     Final   Value: NO GROWTH     Performed at [facility]   Culture     Final   Value: NO GROWTH     Performed at [facility]   Report Status 01/28/2014 FINAL   Final  RESPIRATORY VIRUS PANEL     Status: None   Collection Time    01/28/14  6:55 PM      Result Value Ref Range Status   Source - [facility] NASAL SWAB   Corrected   Comment: CORRECTED ON 05/28 AT 1813: PREVIOUSLY REPORTED AS NASAL SWAB   Respiratory Syncytial Virus A NOT DETECTED   Final   Respiratory Syncytial Virus B NOT DETECTED   Final   Influenza A NOT DETECTED   Final   Influenza B NOT DETECTED   Final   Parainfluenza 1 NOT DETECTED   Final   Parainfluenza 2 NOT DETECTED   Final   Parainfluenza 3 NOT DETECTED   Final   Metapneumovirus NOT DETECTED   Final   Rhinovirus NOT DETECTED   Final   Adenovirus NOT DETECTED   Final   Influenza A H1 NOT DETECTED   Final   Influenza A H3 NOT DETECTED   Final   Comment: (NOTE)           Normal Reference Range for each Analyte: NOT DETECTED     Testing performed using the Luminex xTAG Respiratory Viral Panel test     kit.     This test was developed and its performance characteristics determined     by [facility]. It has not been cleared or approved by the [place]     Food and Drug Administration. This test is used for clinical purposes.     It should not be regarded as investigational or for research. This  laboratory is certified under the Clinical Laboratory Improvement     Amendments of 1988 (CLIA) as qualified to perform high complexity     clinical laboratory testing.     Performed at [facility] [place], BLOOD (ROUTINE X 2)     Status: None   Collection  Time    01/29/14 11:40 AM      Result Value Ref Range Status   Specimen Description BLOOD RIGHT HAND   Final   Special Requests BOTTLES DRAWN AEROBIC ONLY 4CC   Final   Culture  Setup Time     Final   Value: 01/29/2014 16:40     Performed at [facility]   Culture     Final   Value:        BLOOD CULTURE RECEIVED NO GROWTH TO DATE CULTURE WILL BE HELD FOR 5 DAYS BEFORE ISSUING A FINAL NEGATIVE REPORT     Performed at [facility]   Report Status PENDING   Incomplete  CULTURE, BLOOD (ROUTINE X 2)     Status: None   Collection Time    01/29/14 11:46 AM      Result Value Ref Range Status   Specimen Description BLOOD RIGHT ARM   Final   Special Requests BOTTLES DRAWN AEROBIC ONLY 5CC   Final   Culture  Setup Time     Final   Value: 01/29/2014 16:41     Performed at [facility]   Culture     Final   Value:        BLOOD CULTURE RECEIVED NO GROWTH TO DATE CULTURE WILL BE HELD FOR 5 DAYS BEFORE ISSUING A FINAL NEGATIVE REPORT     Performed at [facility]   Report Status PENDING   Incomplete     Studies: Dg Chest Port 1 View  02/02/2014   CLINICAL DATA:  Aspiration pneumonia  EXAM: PORTABLE CHEST - 1 VIEW  COMPARISON:  Portable exam 0701 hr compared to 01/29/2014  FINDINGS: Enlargement of cardiac silhouette.  Atherosclerotic calcification aorta.  Decreased lung volumes with RIGHT basilar atelectasis.  LEFT lower lobe consolidation and associated LEFT pleural effusion.  No pneumothorax.  Bones demineralized with evidence of prior spinal augmentation procedures at multiple levels of the thoracolumbar spine.  IMPRESSION: Persistent LEFT lower lobe consolidation and associated LEFT pleural effusion.  Decreased lung volumes with RIGHT basilar atelectasis.  When compared to the previous exam, LEFT basilar pleural effusion has slightly increased.   Electronically Signed   By: [name] M.D.   On: 02/02/2014 08:10    Scheduled Meds: . amoxicillin-clavulanate  1  tablet Oral BID  . antiseptic oral rinse  15 mL Mouth Rinse q12n4p  . azithromycin  250 mg Oral Daily  . chlorhexidine  15 mL Mouth Rinse BID  . donepezil  10 mg Oral Q supper  . potassium chloride  10 mEq Intravenous Q1 Hr x 4   Continuous Infusions:   Antibiotics Given (last 72 hours)   Date/Time Action Medication Dose Rate   01/30/14 1225 Given   Ampicillin-Sulbactam (UNASYN) 3 g in sodium chloride 0.9 % 100 mL IVPB 3 g 100 mL/hr   01/30/14 1815 Given   Ampicillin-Sulbactam (UNASYN) 3 g in sodium chloride 0.9 % 100 mL IVPB 3 g 100 mL/hr   01/31/14 0100 Given   Ampicillin-Sulbactam (UNASYN) 3 g in sodium chloride 0.9 % 100 mL IVPB 3 g 100 mL/hr   01/31/14 0504 Given   Ampicillin-Sulbactam (UNASYN) 3  g in sodium chloride 0.9 % 100 mL IVPB 3 g 100 mL/hr   01/31/14 1202 Given   Ampicillin-Sulbactam (UNASYN) 3 g in sodium chloride 0.9 % 100 mL IVPB 3 g 100 mL/hr   01/31/14 1700 Given   Ampicillin-Sulbactam (UNASYN) 3 g in sodium chloride 0.9 % 100 mL IVPB 3 g 100 mL/hr   02/01/14 0040 Given   Ampicillin-Sulbactam (UNASYN) 3 g in sodium chloride 0.9 % 100 mL IVPB 3 g 100 mL/hr   02/01/14 0600 Given   Ampicillin-Sulbactam (UNASYN) 3 g in sodium chloride 0.9 % 100 mL IVPB 3 g 100 mL/hr   02/01/14 1253 Given   Ampicillin-Sulbactam (UNASYN) 3 g in sodium chloride 0.9 % 100 mL IVPB 3 g 100 mL/hr   02/01/14 1600 Given   amoxicillin-clavulanate (AUGMENTIN) 500-125 MG per tablet 500 mg 500 mg    02/01/14 2103 Given   amoxicillin-clavulanate (AUGMENTIN) 500-125 MG per tablet 500 mg 500 mg       Time spent: 25 min  Delfina Redwood, MD  [facility] Pager [phone]. If 7PM-7AM, please contact night-coverage at [email or web address], password [facility] 02/02/2014, 10:22 AM  LOS: 6 days

## 2014-02-03 DIAGNOSIS — J690 Pneumonitis due to inhalation of food and vomit: Principal | ICD-10-CM

## 2014-02-03 DIAGNOSIS — J9600 Acute respiratory failure, unspecified whether with hypoxia or hypercapnia: Secondary | ICD-10-CM

## 2014-02-03 DIAGNOSIS — H04129 Dry eye syndrome of unspecified lacrimal gland: Secondary | ICD-10-CM

## 2014-02-03 LAB — CULTURE, BLOOD (ROUTINE X 2)
CULTURE: NO GROWTH
Culture: NO GROWTH

## 2014-02-03 LAB — BASIC METABOLIC PANEL
BUN: 11 mg/dL (ref 6–23)
CO2: 28 mEq/L (ref 19–32)
Calcium: 8.8 mg/dL (ref 8.4–10.5)
Chloride: 104 mEq/L (ref 96–112)
Creatinine, Ser: 0.6 mg/dL (ref 0.50–1.10)
GFR calc Af Amer: 88 mL/min — ABNORMAL LOW (ref >90)
GFR calc non Af Amer: 76 mL/min — ABNORMAL LOW (ref >90)
Glucose, Bld: 95 mg/dL (ref 70–99)
Potassium: 3.7 mEq/L (ref 3.7–5.3)
Sodium: 141 mEq/L (ref 137–147)

## 2014-02-03 LAB — GLUCOSE, CAPILLARY: GLUCOSE-CAPILLARY: 95 mg/dL (ref 70–99)

## 2014-02-03 MED ORDER — FUROSEMIDE 20 MG PO TABS: 20.0000 mg | ORAL_TABLET | Freq: Every day | ORAL | Status: DC

## 2014-02-03 MED ORDER — FUROSEMIDE 20 MG PO TABS
20.0000 mg | ORAL_TABLET | Freq: Every day | ORAL | Status: DC
  Administered 2014-02-03: 20 mg via ORAL
  Filled 2014-02-03: qty 1

## 2014-02-03 MED ORDER — POTASSIUM CHLORIDE CRYS ER 20 MEQ PO TBCR
20.0000 meq | EXTENDED_RELEASE_TABLET | Freq: Every day | ORAL | Status: DC
  Administered 2014-02-03: 20 meq via ORAL
  Filled 2014-02-03: qty 1

## 2014-02-03 MED ORDER — POTASSIUM CHLORIDE CRYS ER 20 MEQ PO TBCR: 20.0000 meq | EXTENDED_RELEASE_TABLET | Freq: Every day | ORAL | Status: DC

## 2014-02-03 NOTE — Progress Notes (Signed)
Clinical Social Work Department BRIEF PSYCHOSOCIAL ASSESSMENT 02/03/2014  Patient:  [name], [name]     Account Number:  [id number]     Admit date:  01/27/2014  Clinical Social Worker:  Freeman Caldron  Date/Time:  02/03/2014 03:41 PM  Referred by:  Physician  Date Referred:  02/03/2014 Referred for  Transportation assistance   Other Referral:   Interview type:  Family Other interview type:    PSYCHOSOCIAL DATA Living Status:  FAMILY Admitted from facility:   Level of care:   Primary support name:  Fisher Callas ([phone]) Primary support relationship to patient:  CHILD, ADULT Degree of support available:   Good--pt lives with daughter.    CURRENT CONCERNS Current Concerns  Other - See comment   Other Concerns:   Transportation assistance--PTAR    SOCIAL WORK ASSESSMENT / PLAN CSW introduced self and explained role in discharge. Confirmed address with pt's daughter at bedside and arranged for PTAR to pick pt up at 4pm. RN informed. CSW signing off.   Assessment/plan status:  No Further Intervention Required Other assessment/ plan:   Information/referral to community resources:   PTAR scheduled for 4 pm    PATIENT'S/FAMILY'S RESPONSE TO PLAN OF CARE: Pt's daughter thanked CSW for assistance       Ky Barban, MSW, [place] Social Worker [phone]

## 2014-02-03 NOTE — Progress Notes (Signed)
SATURATION QUALIFICATIONS: (This note is used to comply with regulatory documentation for home oxygen)  Patient Saturations on Room Air at Rest = 88   Patient Saturations on Room Air while Ambulating = 86   Patient Saturations on 2 Liters of oxygen while Ambulating = 95   Please briefly explain why patient needs home oxygen: Pt short of breath with activity and desats on room air.

## 2014-02-03 NOTE — Progress Notes (Signed)
PT Cancellation Note  Patient Details Name: MAYELIN PANOS MRN: [id number] DOB: 08-Jun-1919   Cancelled Treatment:    Reason Eval/Treat Not Completed: Other (comment).  Pt is discharging today and daughter does not want her to be worn out.  Has equipment and f/u arranged.  Thanks.     [facility] 02/03/2014, 10:37 AM [facility] [phone] (pager)

## 2014-02-03 NOTE — Discharge Summary (Addendum)
Physician Discharge Summary  [name] HCW:[id number] DOB: 30-Jan-1919 DOA: 01/27/2014  PCP: [name], MD  Admit date: 01/27/2014 Discharge date: 02/03/2014  Time spent: 35 minutes  Recommendations for Outpatient Follow-up:  1. BMP Friday re Cr and K 2. Home via ambulance 3. O2- 2L 4. Home health  Discharge Diagnoses:  Principal Problem:   Aspiration pneumonia Active Problems:   DEMENTIA, MILD   HYPERTENSION   SEIZURE DISORDER   Cough   DNR (do not resuscitate)   Pleural effusion   Fever   Hypokalemia   Acute respiratory failure   Discharge Condition: improved  Diet recommendation: DYS 3 thin  Filed Weights   01/28/14 0040  Weight: 61 kg (134 lb 7.7 oz)    History of present illness:  80 yo female h/o mild dementia, htn, hld, copd lives at home with her daughter normally walks with assistance with walker brought in by family for high fevers, cough and sob since yesterday. No n/v/d. No rashes. She is very weak and could not get up out of bed today. More confused than normal. History obtained from ed staff, pt is pleasantly demented and no family in room at this time. Pt is resting comfortably and awakes easily by voice, denies any pain.   Hospital Course:  Pneumonia, possibly aspiration related. Tolerating oral abx- finishing 8 days of abx. Afebrile and WBC now normal. CXR shows possibly increased left pleural effusion- not enough to tap.  p BNP elevated, will give low dose lasix and monitor with home health a response= weight not elevated  Acute resp failure- prob due to CHF- has elevated BNP- lasix given  DEMENTIA  Hypokalemia, recurrent. Replete PO at home now that on low dose lasix HYPERTENSION  SEIZURE DISORDER  Hyponatremia resolved   Procedures:    Consultations:  IR  Discharge Exam: Filed Vitals:   02/03/14 0521  BP: 136/44  Pulse: 85  Temp: 98.3 F (36.8 C)  Resp: 18    General: pleasant/cooperative Cardiovascular:  rrr Respiratory: no wheezing- decreased at bases  Discharge Instructions You were cared for by a hospitalist during your hospital stay. If you have any questions about your discharge medications or the care you received while you were in the hospital after you are discharged, you can call the unit and asked to speak with the hospitalist on call if the hospitalist that took care of you is not available. Once you are discharged, your primary care physician will handle any further medical issues. Please note that NO REFILLS for any discharge medications will be authorized once you are discharged, as it is imperative that you return to your primary care physician (or establish a relationship with a primary care physician if you do not have one) for your aftercare needs so that they can reassess your need for medications and monitor your lab values.      Discharge Instructions   Discharge instructions    Complete by:  As directed   [place] O2 BMP Friday re Cr and K- results to PCP- home health can draw labs DYS diet Low dose lasix- can be titrated up or d/c'd based on response     Increase activity slowly    Complete by:  As directed             Medication List    STOP taking these medications       amLODipine 2.5 MG tablet  Commonly known as:  NORVASC      TAKE these  medications       acetaminophen 325 MG tablet  Commonly known as:  TYLENOL  Take 650 mg by mouth 2 (two) times daily as needed (pain).     aspirin EC 81 MG tablet  Take 81 mg by mouth every other day.     Calcium Carb-Cholecalciferol 500-600 MG-UNIT Chew  Chew 1 tablet by mouth 2 (two) times daily.     cycloSPORINE 0.05 % ophthalmic emulsion  Commonly known as:  RESTASIS  Place 1 drop into both eyes 2 (two) times daily.     donepezil 10 MG tablet  Commonly known as:  ARICEPT  Take 10 mg by mouth daily with supper.     furosemide 20 MG tablet  Commonly known as:  LASIX  Take 1 tablet (20 mg total)  by mouth daily.     HYDROcodone-homatropine 5-1.5 MG/5ML syrup  Commonly known as:  HYCODAN  Take 2.5 mLs by mouth once.     ibandronate 150 MG tablet  Commonly known as:  BONIVA  Take 150 mg by mouth every 30 (thirty) days. Take in the morning with a full glass of water, on an empty stomach, and do not take anything else by mouth or lie down for the next 30 min.; on the 1st Sunday of each month     methenamine 1 G tablet  Commonly known as:  HIPREX  Take 1 g by mouth daily with supper.     mirabegron ER 50 MG Tb24 tablet  Commonly known as:  MYRBETRIQ  Take 50 mg by mouth daily.     OVER THE COUNTER MEDICATION  Apply 1 application topically daily. Anti-fungal powder (apply to skin fold on abdomen after bath)     potassium chloride SA 20 MEQ tablet  Commonly known as:  K-DUR,KLOR-CON  Take 1 tablet (20 mEq total) by mouth daily.     PRESCRIPTION MEDICATION  Place 1 drop into both eyes 4 (four) times daily. Blood Serum Tears compounded at [facility] 1 drop into both eyes 4 (four) times daily. acetylcysteine eye drops compounded at [facility] (use with hot compress)     SYSTANE PRESERVATIVE FREE 0.4-0.3 % Soln  Generic drug:  Polyethyl Glycol-Propyl Glycol  Place 1 drop into both eyes as needed (dry eyes).     Travoprost (BAK Free) 0.004 % Soln ophthalmic solution  Commonly known as:  TRAVATAN  Place 1 drop into both eyes at bedtime.     VIGAMOX 0.5 % ophthalmic solution  Generic drug:  moxifloxacin  Place 1 drop into the left eye 2 (two) times daily. Pt has contact in eye to prevent more scarring between lid and eye - eye drops are used as long as contact is in place       Allergies  Allergen Reactions  . Betimol [Timolol] Swelling  . Other Swelling    Patient is also allergic to several glaucoma eye drops - causes swelling  . Codeine Nausea And Vomiting  . Gabapentin Other (See Comments)     tremors  .  Sulfonamide Derivatives Hives      The results of significant diagnostics from this hospitalization (including imaging, microbiology, ancillary and laboratory) are listed below for reference.    Significant Diagnostic Studies: Ct Head Wo Contrast  01/27/2014   CLINICAL DATA:  Transient right arm weakness.  EXAM: CT HEAD WITHOUT CONTRAST  TECHNIQUE: Contiguous axial images were obtained from the base of the  skull through the vertex without intravenous contrast.  COMPARISON:  03/24/2012  FINDINGS: Small, remote infarcts are again seen in the left centrum semiovale, left thalamus, and right cerebellum, unchanged. Periventricular white matter hypodensities do not appear significantly changed and are compatible with moderate chronic small vessel ischemic disease. Cerebral atrophy is unchanged and likely within normal limits for age. There is no evidence of acute cortical infarct, intracranial hemorrhage, mass, midline shift, or extra-axial fluid collection.  Prior bilateral cataract surgery is noted. There is a small to moderate right mastoid effusion. Trace left sphenoid sinus mucosal thickening is noted.  IMPRESSION: 1. No evidence of acute intracranial abnormality. 2. Stable appearance of chronic ischemic changes.   Electronically Signed   By: Logan Bores   On: 01/27/2014 21:15   [place] Chest  02/02/2014   CLINICAL DATA:  80 year old female being assessed for thoracentesis. Initial encounter.  EXAM: CHEST ULTRASOUND  COMPARISON:  Chest radiographs 0701 hr the same day and earlier.  FINDINGS: Small volume left pleural effusion demonstrated, quantity insufficient for thoracentesis.  IMPRESSION: Small left pleural effusion, quantity insufficient for thoracentesis.   Electronically Signed   By: Lars Pinks M.D.   On: 02/02/2014 12:32   Dg Chest Port 1 View  02/02/2014   CLINICAL DATA:  Aspiration pneumonia  EXAM: PORTABLE CHEST - 1 VIEW  COMPARISON:  Portable exam 0701 hr compared to 01/29/2014  FINDINGS:  Enlargement of cardiac silhouette.  Atherosclerotic calcification aorta.  Decreased lung volumes with RIGHT basilar atelectasis.  LEFT lower lobe consolidation and associated LEFT pleural effusion.  No pneumothorax.  Bones demineralized with evidence of prior spinal augmentation procedures at multiple levels of the thoracolumbar spine.  IMPRESSION: Persistent LEFT lower lobe consolidation and associated LEFT pleural effusion.  Decreased lung volumes with RIGHT basilar atelectasis.  When compared to the previous exam, LEFT basilar pleural effusion has slightly increased.   Electronically Signed   By: [name] M.D.   On: 02/02/2014 08:10   Dg Chest Port 1 View  01/29/2014   CLINICAL DATA:  Fever  EXAM: PORTABLE CHEST - 1 VIEW  COMPARISON:  Jan 27, 2014  FINDINGS: There is airspace consolidation in the left base. The interstitium is diffusely prominent overall, in part due to shallow degree of inspiration. There may be some mild interstitial edema superimposed. Heart is upper normal in size with pulmonary vascularity within normal limits. There is atherosclerotic change in aorta. No adenopathy. Patient is status post kyphoplasty procedures in the lower thoracic region.  IMPRESSION: New consolidation left base. Aspiration could present in this manner.  Question mild interstitial edema superimposed. Some of the interstitial prominence is due to the overall shallow degree of inspiratory effort.   Electronically Signed   By: Lowella Grip M.D.   On: 01/29/2014 13:36   Dg Chest Port 1 View  (if Code Sepsis Called)  01/27/2014   CLINICAL DATA:  Cough.  EXAM: PORTABLE CHEST - 1 VIEW  COMPARISON:  10/20/2013.  FINDINGS: Mediastinum and hilar structures normal. Interim clearing of right lower lobe atelectasis/ infiltrate and right pleural effusion. Low lung volumes. Chronic interstitial lung disease. No pneumothorax. No acute osseous abnormality. Prior vertebroplasty.  IMPRESSION: Interim clearing of right lower  lobe infiltrate and right pleural effusion from chest x-ray of 10/20/2013. No acute abnormality identified.   Electronically Signed   By: Marcello Moores  Register   On: 01/27/2014 20:30   Dg Swallowing Func-speech Pathology  01/29/2014   Mo[place], CCC-SLP     01/29/2014  2:29 PM Objective Swallowing Evaluation: Modified Barium Swallowing Study   Patient Details  Name: [name] MRN: [id number] Date of Birth: Nov 20, 1918  Today's Date: 01/29/2014 Time: 74-1340 SLP Time Calculation (min): 15 min  Past Medical History:  Past Medical History  Diagnosis Date  . Anemia   . Hyperlipidemia   . Hypertension   . Seizure disorder     poss from hponatremia when had pneumonia  . Recurrent UTI   . Urinary incontinence     mild  . Osteoporosis     Grade II spondylotistesis at l4-l5 testing in [place] 2008 and  confirmed that she has osteoporosis  . Spondylolisthesis of lumbar region     Grade II spondylotistesis at l4-l5 testing in [place] 2008   . Contusion of leg 08/06/2011  . Hematoma 08/06/2011    leg near shin no bov underlying bony abnormality and no  infection. should take a while to resolve observe for infection   . Chest wall pain     "off and on" (05/26/2013)  . COPD (chronic obstructive pulmonary disease)   . Pleural effusion     "a few times" (05/26/2013)  . Seizures ~ 2009    "after pneumonia; electrolytes were all messed up" (05/26/2013)  . Pneumonia 09/2002; ~ 2009;   . Arthritis   . Osteoarthritis   . Thoracic compression fracture   . Thoracic back pain   . Skin cancer 2012    leg   . Dementia    Past Surgical History:  Past Surgical History  Procedure Laterality Date  . Abdominal hysterectomy    . Lumbar disc surgery    . Breast lumpectomy Bilateral      had fibrofatty tissue  . Cataract extraction w/ intraocular lens implant Left   . Glaucoma surgery Left   . Eye surgery    . Skin cancer excision  2012   HPI:  80 yo female admitted 01/27/14 due to fever, cough, and SOB.  PMH  significant for mild-moderate dementia,  htn, hld, copd.  MBS  recommended to assess for silent aspiration and safest diet  texture and liquid recommendations.     Assessment / Plan / Recommendation Clinical Impression  Dysphagia Diagnosis: Mild oral phase dysphagia Clinical impression: Pt. exhibited a mild oral dysphagia  characterized by initial transit delays and prolonged mastication  with solids.  Pharyngeal phase was within functional limits for a  pt. of 80 years of age.  Swallow initiation was timely with flash  penetration x 1 with straw (ie. barium briefly entered laryngeal  vestibule before exiting during the swallow).  Trace vallecular  residue which did not increase aspiration risk.  It is possible  pt. may have intermittent episodes of aspiration although today's  MBS appeared typical for pt. of same age.  Recommend continue Dys  3 due to current deconditioning and thin liquid via cup, no  straws, pills whole in applesauce, sit upright, small bites/sips.   ST can follow up for education with daughter on swallow  strategies x 1.      Treatment Recommendation  Therapy as outlined in treatment plan below    Diet Recommendation Dysphagia 3 (Mechanical Soft);Thin liquid   Liquid Administration via: Cup;No straw Medication Administration: Whole meds with puree Supervision: Patient able to self feed;Intermittent supervision  to cue for compensatory strategies Compensations: Slow rate;Small sips/bites Postural Changes and/or Swallow Maneuvers: Seated upright 90  degrees    Other  Recommendations Oral Care Recommendations: Oral care BID  Follow Up Recommendations  None    Frequency and Duration min 1 x/week  2 weeks   Pertinent Vitals/Pain WDL            Reason for Referral Objectively evaluate swallowing function   Oral Phase Oral Preparation/Oral Phase Oral Phase: Impaired Oral - Solids Oral - Puree: Delayed oral transit Oral - Regular: Delayed oral transit   Pharyngeal Phase Pharyngeal Phase Pharyngeal Phase: Impaired Pharyngeal - Honey Pharyngeal  - Honey Teaspoon: Pharyngeal residue -  valleculae;Reduced tongue base retraction (trace) Pharyngeal - Nectar Pharyngeal - Nectar Cup: Pharyngeal residue - valleculae;Reduced  tongue base retraction (trace) Pharyngeal - Thin Pharyngeal - Thin Teaspoon: Within functional limits Pharyngeal - Thin Cup: Pharyngeal residue - valleculae;Reduced  tongue base retraction (trace) Pharyngeal - Thin Straw: Penetration/Aspiration during swallow Penetration/Aspiration details (thin straw): Material enters  airway, remains ABOVE vocal cords then ejected out Pharyngeal - Solids Pharyngeal - Puree: Within functional limits  Cervical Esophageal Phase        Cervical Esophageal Phase Cervical Esophageal Phase: [facility]         [place] Mon.Ed CCC-SLP Pager [phone]  01/29/2014    Microbiology: Recent Results (from the past 240 hour(s))  CULTURE, BLOOD (ROUTINE X 2)     Status: None   Collection Time    01/27/14  7:54 PM      Result Value Ref Range Status   Specimen Description BLOOD RIGHT FOREARM   Final   Special Requests BOTTLES DRAWN AEROBIC AND ANAEROBIC 5CC   Final   Culture  Setup Time     Final   Value: 01/28/2014 00:27     Performed at [facility]   Culture     Final   Value: NO GROWTH 5 DAYS     Performed at [facility]   Report Status 02/03/2014 FINAL   Final  CULTURE, BLOOD (ROUTINE X 2)     Status: None   Collection Time    01/27/14  7:59 PM      Result Value Ref Range Status   Specimen Description BLOOD HAND RIGHT   Final   Special Requests BOTTLES DRAWN AEROBIC ONLY 5CC   Final   Culture  Setup Time     Final   Value: 01/28/2014 00:26     Performed at [facility]   Culture     Final   Value: NO GROWTH 5 DAYS     Performed at [facility]   Report Status 02/03/2014 FINAL   Final  URINE CULTURE     Status: None   Collection Time    01/27/14  9:34 PM      Result Value Ref Range Status   Specimen Description URINE, CATHETERIZED   Final   Special  Requests NONE   Final   Culture  Setup Time     Final   Value: 01/27/2014 22:46     Performed at [facility]     Final   Value: NO GROWTH     Performed at [facility]   Culture     Final   Value: NO GROWTH     Performed at [facility]   Report Status 01/28/2014 FINAL   Final  RESPIRATORY VIRUS PANEL     Status: None   Collection Time    01/28/14  6:55 PM      Result Value Ref Range Status   Source - [facility] NASAL SWAB  Corrected   Comment: CORRECTED ON 05/28 AT 1813: PREVIOUSLY REPORTED AS NASAL SWAB   Respiratory Syncytial Virus A NOT DETECTED   Final   Respiratory Syncytial Virus B NOT DETECTED   Final   Influenza A NOT DETECTED   Final   Influenza B NOT DETECTED   Final   Parainfluenza 1 NOT DETECTED   Final   Parainfluenza 2 NOT DETECTED   Final   Parainfluenza 3 NOT DETECTED   Final   Metapneumovirus NOT DETECTED   Final   Rhinovirus NOT DETECTED   Final   Adenovirus NOT DETECTED   Final   Influenza A H1 NOT DETECTED   Final   Influenza A H3 NOT DETECTED   Final   Comment: (NOTE)           Normal Reference Range for each Analyte: NOT DETECTED     Testing performed using the Luminex xTAG Respiratory Viral Panel test     kit.     This test was developed and its performance characteristics determined     by [facility]. It has not been cleared or approved by the [place]     Food and Drug Administration. This test is used for clinical purposes.     It should not be regarded as investigational or for research. This     laboratory is certified under the [place] (CLIA) as qualified to perform high complexity     clinical laboratory testing.     Performed at [facility], BLOOD (ROUTINE X 2)     Status: None   Collection Time    01/29/14 11:40 AM      Result Value Ref Range Status   Specimen Description BLOOD RIGHT HAND   Final   Special Requests BOTTLES DRAWN  AEROBIC ONLY 4CC   Final   Culture  Setup Time     Final   Value: 01/29/2014 16:40     Performed at [facility]   Culture     Final   Value:        BLOOD CULTURE RECEIVED NO GROWTH TO DATE CULTURE WILL BE HELD FOR 5 DAYS BEFORE ISSUING A FINAL NEGATIVE REPORT     Performed at [facility]   Report Status PENDING   Incomplete  CULTURE, BLOOD (ROUTINE X 2)     Status: None   Collection Time    01/29/14 11:46 AM      Result Value Ref Range Status   Specimen Description BLOOD RIGHT ARM   Final   Special Requests BOTTLES DRAWN AEROBIC ONLY 5CC   Final   Culture  Setup Time     Final   Value: 01/29/2014 16:41     Performed at [facility]   Culture     Final   Value:        BLOOD CULTURE RECEIVED NO GROWTH TO DATE CULTURE WILL BE HELD FOR 5 DAYS BEFORE ISSUING A FINAL NEGATIVE REPORT     Performed at [facility]   Report Status PENDING   Incomplete     Labs: Basic Metabolic Panel:  Recent Labs Lab 01/28/14 0340 01/29/14 0355 01/29/14 1145 01/30/14 0617 02/02/14 0428 02/03/14 0421  NA 135* 133*  --  136* 140 141  K 3.4* 3.1*  --  3.5* 2.9* 3.7  CL 102 98  --  100 102 104  CO2 21 23  --  24  27 28  GLUCOSE 124* 125*  --  101* 99 95  BUN 13 11  --  _0 CREATININE 0.65 0.67  --  0.52 0.51 0.60  CALCIUM 7.6* 8.0*  --  8.2* 8.4 8.8  MG  --   --  1.9  --   --   --    Liver Function Tests:  Recent Labs Lab 01/27/14 1954  AST 27  ALT 12  ALKPHOS 90  BILITOT 0.2*  PROT 8.3  ALBUMIN 3.4*   No results found for this basename: LIPASE, AMYLASE,  in the last 168 hours No results found for this basename: AMMONIA,  in the last 168 hours CBC:  Recent Labs Lab 01/27/14 1954 01/28/14 0340 01/29/14 0355 01/30/14 0617 02/02/14 0428  WBC 15.5* 11.7* 13.5* 11.5* 8.9  NEUTROABS 14.4* 9.8*  --  10.0*  --   HGB 13.6 10.6* 10.2* 10.1* 9.8*  HCT 39.0 33.1* 30.0* 30.2* 29.9*  MCV 88.6 89.7 88.8 88.3 89.3  PLT 214 177 170 178 261   Cardiac  Enzymes:  Recent Labs Lab 01/29/14 1832 01/30/14 0112 01/30/14 0617  TROPONINI <0.30 <0.30 <0.30   BNP: BNP (last 3 results)  Recent Labs  05/27/13 0100 05/28/13 0602 02/02/14 0428  PROBNP 551.6* 338.4 3497.0*   CBG:  Recent Labs Lab 02/01/14 1620 02/01/14 2105 02/02/14 0744 02/02/14 1115 02/02/14 1635  GLUCAP 103* 129* 97 99 141*       Signed:  Geradine Girt  [facility] 02/03/2014, 10:48 AM

## 2014-02-03 NOTE — Progress Notes (Signed)
Patient evaluated for community based chronic disease management services with [facility] as a benefit of patient's [employer]. Spoke with patient's HCPOA/Daughter Halford Chessman Mobile: [phone] or Home: [phone]) at bedside to explain [place] [facility] services.   Services have been accepted with written consent.  Ensure TID has been ordered made daughter aware of our discounted pricing.  Patient has moderate dementia and her daughter is therefore the primary contact.  Patient will receive a post discharge transition of care call and will be evaluated for monthly home visits for assessments and disease process education.  Left contact information and [facility] literature at bedside. Made Inpatient Case Manager aware that [place] Management following. Of note, [facility] Management services does not replace or interfere with any services that are arranged by inpatient case management or social work.  For additional questions or referrals please contact Corliss Blacker BSN RN [facility] Liaison at [phone].

## 2014-02-04 ENCOUNTER — Telehealth: Payer: Self-pay | Admitting: Internal Medicine

## 2014-02-04 LAB — CULTURE, BLOOD (ROUTINE X 2)
Culture: NO GROWTH
Culture: NO GROWTH

## 2014-02-04 MED ORDER — AMOXICILLIN-POT CLAVULANATE 875-125 MG PO TABS: 1.0000 | ORAL_TABLET | Freq: Two times a day (BID) | ORAL | Status: DC

## 2014-02-04 NOTE — Telephone Encounter (Signed)
Pt daughter called to say that pt was in hosp with  Pneumonia and came home 02/03/14 without her rx for antibiotic Augmetin or Amlodipine daughter is requesting these be called in [facility] [address]

## 2014-02-04 NOTE — Telephone Encounter (Signed)
Per WP, okay to fill Augmentin [phone] 1 bid #6.  [name] (daughter) notified to pick up at the pharmacy.

## 2014-02-05 ENCOUNTER — Telehealth: Payer: Self-pay | Admitting: Internal Medicine

## 2014-02-05 NOTE — Telephone Encounter (Signed)
Ok please proceed  With OT referral

## 2014-02-05 NOTE — Telephone Encounter (Signed)
Since pt was up and active prior to hos stay, [facility] is also asking for Occupational therapy.  Orders  For home health and PT have already been ordered. Verbal is ok

## 2014-02-06 ENCOUNTER — Telehealth: Payer: Self-pay | Admitting: Internal Medicine

## 2014-02-06 NOTE — Telephone Encounter (Signed)
Not much choice i guess. Can do at OV IF  No change in medication dosing and doing well

## 2014-02-06 NOTE — Telephone Encounter (Signed)
Left a message at the below listed number for [name] to return my call.

## 2014-02-06 NOTE — Telephone Encounter (Signed)
[name] called from [facility] to say that they were not able to draw her blood today she is asking if you want her to try and draw again or if it can be drawn at the office doing her appt Tuesday  Call back [phone]

## 2014-02-06 NOTE — Telephone Encounter (Signed)
Spoke to [place] from [facility].  She reports no changes in meds and pt is doing well.  Advised that we will get labs while here for her OV.

## 2014-02-09 NOTE — Telephone Encounter (Signed)
Spoke to [employer] and gave verbal order for OT.

## 2014-02-10 ENCOUNTER — Ambulatory Visit (INDEPENDENT_AMBULATORY_CARE_PROVIDER_SITE_OTHER): Payer: Medicare Other | Admitting: Internal Medicine

## 2014-02-10 ENCOUNTER — Encounter: Payer: Self-pay | Admitting: Internal Medicine

## 2014-02-10 VITALS — BP 134/66 | HR 82 | Temp 99.0°F

## 2014-02-10 DIAGNOSIS — F068 Other specified mental disorders due to known physiological condition: Secondary | ICD-10-CM

## 2014-02-10 DIAGNOSIS — I10 Essential (primary) hypertension: Secondary | ICD-10-CM

## 2014-02-10 DIAGNOSIS — J90 Pleural effusion, not elsewhere classified: Secondary | ICD-10-CM

## 2014-02-10 DIAGNOSIS — I509 Heart failure, unspecified: Secondary | ICD-10-CM

## 2014-02-10 DIAGNOSIS — D649 Anemia, unspecified: Secondary | ICD-10-CM

## 2014-02-10 DIAGNOSIS — J690 Pneumonitis due to inhalation of food and vomit: Secondary | ICD-10-CM

## 2014-02-10 DIAGNOSIS — Z09 Encounter for follow-up examination after completed treatment for conditions other than malignant neoplasm: Secondary | ICD-10-CM

## 2014-02-10 LAB — CBC WITH DIFFERENTIAL/PLATELET
Basophils Absolute: 0 10*3/uL (ref 0.0–0.1)
Basophils Relative: 0.5 % (ref 0.0–3.0)
Eosinophils Absolute: 0.2 10*3/uL (ref 0.0–0.7)
Eosinophils Relative: 3 % (ref 0.0–5.0)
HCT: 37.1 % (ref 36.0–46.0)
HEMOGLOBIN: 12.3 g/dL (ref 12.0–15.0)
LYMPHS PCT: 15.7 % (ref 12.0–46.0)
Lymphs Abs: 1.1 10*3/uL (ref 0.7–4.0)
MCHC: 33.2 g/dL (ref 30.0–36.0)
MCV: 91.4 fl (ref 78.0–100.0)
MONOS PCT: 6 % (ref 3.0–12.0)
Monocytes Absolute: 0.4 10*3/uL (ref 0.1–1.0)
NEUTROS ABS: 5.5 10*3/uL (ref 1.4–7.7)
Neutrophils Relative %: 74.8 % (ref 43.0–77.0)
Platelets: 372 10*3/uL (ref 150.0–400.0)
RBC: 4.06 Mil/uL (ref 3.87–5.11)
RDW: 14 % (ref 11.5–15.5)
WBC: 7.3 10*3/uL (ref 4.0–10.5)

## 2014-02-10 LAB — BASIC METABOLIC PANEL
BUN: 29 mg/dL — ABNORMAL HIGH (ref 6–23)
CO2: 32 mEq/L (ref 19–32)
Calcium: 9.6 mg/dL (ref 8.4–10.5)
Chloride: 100 mEq/L (ref 96–112)
Creatinine, Ser: 0.8 mg/dL (ref 0.4–1.2)
GFR: 70.12 mL/min (ref >60.00)
Glucose, Bld: 97 mg/dL (ref 70–99)
Potassium: 3.8 mEq/L (ref 3.5–5.1)
SODIUM: 139 meq/L (ref 135–145)

## 2014-02-10 NOTE — Progress Notes (Signed)
Chief Complaint  Patient presents with  . Follow-up    hospital    HPI: Holly ARRAZOLA 80 yo pt sit ht mild dementia and UI  And utis hyponatremia who  comes in today for follow up of  multiple medical problems.  hospitalization from for resp faialure  CHF and PNA prob aspiration  triggered  . Rx with unasyn  Switched to oral day before discharge . She is finishing course of augmentin  . K and cr were supposed to ve done 3 days ago but [facility] couldn't easily obtain specimen so delayed . Since dc  [place] of chest shows minimal effusion.  Doing better  o2 2 liters   Most time unless she pulls off at night  Goes down to 88 Took last dose Augmentin about 36 hrs  ago.  Also on lasix.  And potassium. Due for lab check  No edema  Cob new Off amlodipine since hospitalization  Admit date: 01/27/2014  Discharge date: 02/03/2014  Time spent: 35 minutes  Recommendations for Outpatient Follow-up:  1. BMP Friday re Cr and K 2. Home via ambulance 3. O2- 2L 4. Home health Discharge Diagnoses:  Principal Problem:  Aspiration pneumonia  Active Problems:  DEMENTIA, MILD  HYPERTENSION  SEIZURE DISORDER  Cough  DNR (do not resuscitate)  Pleural effusion  Fever  Hypokalemia  Acute respiratory failure  Discharge Condition: improved  Diet recommendation: DYS 3 thin  Filed Weights    01/28/14 0040   Weight:  61 kg (134 lb 7.7 oz)       ROS: See pertinent positives and negatives per HPI.  Past Medical History  Diagnosis Date  . Anemia   . Hyperlipidemia   . Hypertension   . Seizure disorder     poss from hponatremia when had pneumonia  . Recurrent UTI   . Urinary incontinence     mild  . Osteoporosis     Grade II spondylotistesis at l4-l5 testing in ealy 2008 and confirmed that she has osteoporosis  . Spondylolisthesis of lumbar region     Grade II spondylotistesis at l4-l5 testing in e[place] 2008   . Contusion of leg 08/06/2011  . Hematoma 08/06/2011    leg near shin no bov underlying bony  abnormality and no infection. should take a while to resolve observe for infection   . Chest wall pain     "off and on" (05/26/2013)  . COPD (chronic obstructive pulmonary disease)   . Pleural effusion     "a few times" (05/26/2013)  . Seizures ~ 2009    "after pneumonia; electrolytes were all messed up" (05/26/2013)  . Pneumonia 09/2002; ~ 2009;   . Arthritis   . Osteoarthritis   . Thoracic compression fracture   . Thoracic back pain   . Skin cancer 2012    leg   . Dementia     Family History  Problem Relation Age of Onset  . Mental retardation Daughter   . Hearing loss Daughter     History   Social History  . Marital Status: Widowed    Spouse Name: N/A    Number of Children: N/A  . Years of Education: N/A   Social History Main Topics  . Smoking status: Never Smoker   . Smokeless tobacco: Never Used  . Alcohol Use: No  . Drug Use: No  . Sexual Activity: No   Other Topics Concern  . None   Social History Narrative   Moved from [place] to  daughter's home   [facility] of 4   Memory test conducted in 2002 and 2007   G4P4   Goes to adult day care in day    Outpatient Encounter Prescriptions as of 02/10/2014  Medication Sig  . acetaminophen (TYLENOL) 325 MG tablet Take 650 mg by mouth 2 (two) times daily as needed (pain).  Marland Kitchen aspirin EC 81 MG tablet Take 81 mg by mouth every other day.  . Calcium Carb-Cholecalciferol 500-600 MG-UNIT CHEW Chew 1 tablet by mouth 2 (two) times daily.  . cycloSPORINE (RESTASIS) 0.05 % ophthalmic emulsion Place 1 drop into both eyes 2 (two) times daily.   Marland Kitchen donepezil (ARICEPT) 10 MG tablet Take 10 mg by mouth daily with supper.  . furosemide (LASIX) 20 MG tablet Take 1 tablet (20 mg total) by mouth daily.  Marland Kitchen ibandronate (BONIVA) 150 MG tablet Take 150 mg by mouth every 30 (thirty) days. Take in the morning with a full glass of water, on an empty stomach, and do not take anything else by mouth or lie down for the next 30 min.; on the 1st Sunday of each  month  . methenamine (HIPREX) 1 G tablet Take 1 g by mouth daily with supper.   . mirabegron ER (MYRBETRIQ) 50 MG TB24 tablet Take 50 mg by mouth daily.  Marland Kitchen moxifloxacin (VIGAMOX) 0.5 % ophthalmic solution Place 1 drop into the left eye 2 (two) times daily. Pt has contact in eye to prevent more scarring between lid and eye - eye drops are used as long as contact is in place  . OVER THE COUNTER MEDICATION Apply 1 application topically daily. Anti-fungal powder (apply to skin fold on abdomen after bath)  . Polyethyl Glycol-Propyl Glycol (SYSTANE PRESERVATIVE FREE) 0.4-0.3 % SOLN Place 1 drop into both eyes as needed (dry eyes).  . potassium chloride SA (K-DUR,KLOR-CON) 20 MEQ tablet Take 1 tablet (20 mEq total) by mouth daily.  Marland Kitchen PRESCRIPTION MEDICATION Place 1 drop into both eyes 4 (four) times daily. Blood Serum Tears compounded at [place] 1 drop into both eyes 4 (four) times daily. acetylcysteine eye drops compounded at [place] (use with hot compress)  . Travoprost, BAK Free, (TRAVATAN) 0.004 % SOLN ophthalmic solution Place 1 drop into both eyes at bedtime.  . [DISCONTINUED] amoxicillin-clavulanate (AUGMENTIN) 875-125 MG per tablet Take 1 tablet by mouth 2 (two) times daily.  . [DISCONTINUED] HYDROcodone-homatropine (HYCODAN) 5-1.5 MG/5ML syrup Take 2.5 mLs by mouth once.    EXAM:  BP 134/66  Pulse 82  Temp(Src) 99 F (37.2 C) (Oral)  SpO2 96%  There is no weight on file to calculate BMI.  GENERAL: vitals reviewed and listed above, alert, , appears well hydrated and in no acute distress on o2 in WC pleasant   HEENT: atraumatic, conjunctiva  clear, no obvious abnormalities on inspection of external nose and ears  Wax in eacs  NECK: no obvious masses on inspection palpation  LUNGS: clear to auscultation bilaterally, no wheezes, rales or rhonchi, dec bs at bases  CV: HRRR, no clubbing cyanosis or  peripheral edema nl cap  refill  MS: moves all extremities  oa changes   Lab Results  Component Value Date   WBC 8.9 02/02/2014   HGB 9.8* 02/02/2014   HCT 29.9* 02/02/2014   PLT 261 02/02/2014   GLUCOSE 95 02/03/2014   CHOL  Value: 101        ATP III CLASSIFICATION:  <200  mg/dL   Desirable  200-239  mg/dL   Borderline High  >=240    mg/dL   High 07/17/2008   TRIG 52 07/17/2008   HDL 47 07/17/2008   LDLCALC  Value: 44        Total Cholesterol/HDL:CHD Risk Coronary Heart Disease Risk Table                     Men   Women  1/2 Average Risk   3.4   3.3 07/17/2008   ALT 12 01/27/2014   AST 27 01/27/2014   NA 141 02/03/2014   K 3.7 02/03/2014   CL 104 02/03/2014   CREATININE 0.60 02/03/2014   BUN 11 02/03/2014   CO2 28 02/03/2014   TSH 1.03 02/14/2012   INR 1.2* 01/15/2013   HGBA1C 5.6 01/28/2014   .last bnp 3497    338 9 14  Prev mild elevations  ASSESSMENT AND PLAN:  Discussed the following assessment and plan:  Aspiration pneumonia - clinically improved  hx recurrent pna .  - Plan: Basic metabolic panel, CBC with Differential  Pleural effusion - non tapable  - Plan: Basic metabolic panel, CBC with Differential  Unspecified essential hypertension - off amlod  stay off  - Plan: Basic metabolic panel, CBC with Differential  Other persistent mental disorders due to conditions classified elsewhere - Plan: Basic metabolic panel, CBC with Differential  Hospital discharge follow-up  Anemia - 9.8 last check in hospital  no active bleeding - Plan: Basic metabolic panel, CBC with Differential  CHF (congestive heart failure) - for now stay on lasix and K supp bmp today   -Patient advised to return or notify health care team  if symptoms worsen ,persist or new concerns arise.  Patient Instructions  Will notify you  of labs when available. Then plan follow up. continue current meds .        Standley Brooking. Panosh M.D. Pre visit review using our clinic review tool, if applicable. No additional management support is needed unless  otherwise documented below in the visit note.

## 2014-02-10 NOTE — Patient Instructions (Signed)
Will notify you  of labs when available. Then plan follow up. continue current meds .

## 2014-02-11 ENCOUNTER — Telehealth: Payer: Self-pay | Admitting: Internal Medicine

## 2014-02-11 NOTE — Telephone Encounter (Signed)
Relevant patient education assigned to patient using Emmi. ° °

## 2014-02-18 ENCOUNTER — Telehealth: Payer: Self-pay | Admitting: Internal Medicine

## 2014-02-18 NOTE — Telephone Encounter (Signed)
Called and spoke to [place].  Per Keturah Barre, MD ok to try lasix eod until Dr. Regis Bill returns.  [name] is pushing fluids.  Will call back if help is needed.

## 2014-02-18 NOTE — Telephone Encounter (Signed)
Pt is on lasix 20 mg once a day and daughter has changed lasix to 20 mg every other day. Pt barbara vital ok and endurance ok and ambulating a little better. Per Pamala Hurry per is  slightly dehydrated  Per skin ?pinched.

## 2014-02-22 DIAGNOSIS — G40909 Epilepsy, unspecified, not intractable, without status epilepticus: Secondary | ICD-10-CM

## 2014-02-22 DIAGNOSIS — J90 Pleural effusion, not elsewhere classified: Secondary | ICD-10-CM

## 2014-02-22 DIAGNOSIS — J690 Pneumonitis due to inhalation of food and vomit: Secondary | ICD-10-CM

## 2014-02-22 DIAGNOSIS — J441 Chronic obstructive pulmonary disease with (acute) exacerbation: Secondary | ICD-10-CM

## 2014-02-22 NOTE — Telephone Encounter (Signed)
Decrease lasix to  3 x per week . ( tak potssium on days she takes the lasix    (She has had 2 hospitalizations with need for diuresis ) Keep appt for lab in 2-3 weeks

## 2014-02-23 NOTE — Telephone Encounter (Signed)
Left a message at the home number for [name] to return my call.

## 2014-02-26 NOTE — Telephone Encounter (Signed)
Left a message for [name] to return my call.

## 2014-02-27 NOTE — Telephone Encounter (Signed)
[name] notified by telephone.

## 2014-03-02 ENCOUNTER — Encounter: Payer: Self-pay | Admitting: Internal Medicine

## 2014-03-09 ENCOUNTER — Telehealth: Payer: Self-pay | Admitting: Internal Medicine

## 2014-03-09 NOTE — Telephone Encounter (Signed)
Per daughter Patrici Ranks young would like order sent to [facility] to pick up oxygen tank. Pt no longer needs oxygen

## 2014-03-09 NOTE — Telephone Encounter (Signed)
Ok to send order to D/C O2?

## 2014-03-09 NOTE — Telephone Encounter (Signed)
Please document the pulse ox  and clinical status in the record . Before dcing( remember the form that  Was rejected by [facility]? )

## 2014-03-11 NOTE — Telephone Encounter (Signed)
Spoke to Sheldon, she reports Radonna is doing well.  Her oxygen is running 96-97%.  Will send in the d/c order to [facility].

## 2014-03-13 ENCOUNTER — Other Ambulatory Visit (INDEPENDENT_AMBULATORY_CARE_PROVIDER_SITE_OTHER): Payer: Medicare Other

## 2014-03-13 ENCOUNTER — Other Ambulatory Visit: Payer: Self-pay | Admitting: Family Medicine

## 2014-03-13 DIAGNOSIS — I10 Essential (primary) hypertension: Secondary | ICD-10-CM

## 2014-03-13 LAB — BASIC METABOLIC PANEL
BUN: 18 mg/dL (ref 6–23)
CHLORIDE: 98 meq/L (ref 96–112)
CO2: 29 meq/L (ref 19–32)
Calcium: 9.4 mg/dL (ref 8.4–10.5)
Creatinine, Ser: 0.7 mg/dL (ref 0.4–1.2)
GFR: 77.81 mL/min (ref >60.00)
Glucose, Bld: 108 mg/dL — ABNORMAL HIGH (ref 70–99)
Potassium: 4 mEq/L (ref 3.5–5.1)
Sodium: 134 mEq/L — ABNORMAL LOW (ref 135–145)

## 2014-03-13 MED ORDER — POTASSIUM CHLORIDE CRYS ER 20 MEQ PO TBCR: EXTENDED_RELEASE_TABLET | ORAL | Status: AC

## 2014-03-13 MED ORDER — FUROSEMIDE 20 MG PO TABS: ORAL_TABLET | ORAL | Status: AC

## 2014-03-13 NOTE — Telephone Encounter (Signed)
Sent to the pharmacy by e-scribe. 

## 2014-03-17 ENCOUNTER — Telehealth: Payer: Self-pay | Admitting: Internal Medicine

## 2014-03-17 NOTE — Telephone Encounter (Signed)
Pt daughter called to ask if paperwork she dropped off is ready for pick up

## 2014-03-22 ENCOUNTER — Encounter: Payer: Self-pay | Admitting: Internal Medicine

## 2014-03-22 DIAGNOSIS — F0390 Unspecified dementia without behavioral disturbance: Secondary | ICD-10-CM | POA: Insufficient documentation

## 2014-03-24 ENCOUNTER — Encounter: Payer: Self-pay | Admitting: Internal Medicine

## 2014-03-24 NOTE — Telephone Encounter (Signed)
Will check to see if Dr. Regis Bill has paper work.

## 2014-03-24 NOTE — Telephone Encounter (Signed)
Paperwork faxed °

## 2014-03-24 NOTE — Telephone Encounter (Signed)
Letter is on Misty's desk. Please put on letterhead paper is needed and ask Daughter [name] if she thinks they need medical records or just a statement.

## 2014-03-26 ENCOUNTER — Telehealth: Payer: Self-pay | Admitting: Family Medicine

## 2014-03-26 NOTE — Telephone Encounter (Signed)
Spoke to [name] and advised she may come and pick up the letter at the front desk. Given to [name] to put on letter head.  [name] stated she does not believe that she will need any medical records.  Will call back if needed.

## 2014-03-26 NOTE — Telephone Encounter (Signed)
[name] would like to know when the patient should return for a follow up.  Please advise.  Thanks!

## 2014-03-27 NOTE — Telephone Encounter (Signed)
Pt is sch °

## 2014-03-27 NOTE — Telephone Encounter (Signed)
Please call [name] and arrange appointment.  Thanks!

## 2014-03-27 NOTE — Telephone Encounter (Signed)
Plan rov in 1-2 months

## 2014-04-07 ENCOUNTER — Encounter: Payer: Self-pay | Admitting: Internal Medicine

## 2014-04-07 ENCOUNTER — Ambulatory Visit (INDEPENDENT_AMBULATORY_CARE_PROVIDER_SITE_OTHER): Payer: Medicare Other | Admitting: Internal Medicine

## 2014-04-07 VITALS — BP 134/64 | HR 91 | Temp 99.0°F

## 2014-04-07 DIAGNOSIS — H01006 Unspecified blepharitis left eye, unspecified eyelid: Secondary | ICD-10-CM

## 2014-04-07 DIAGNOSIS — F0390 Unspecified dementia without behavioral disturbance: Secondary | ICD-10-CM

## 2014-04-07 DIAGNOSIS — H01009 Unspecified blepharitis unspecified eye, unspecified eyelid: Secondary | ICD-10-CM

## 2014-04-07 DIAGNOSIS — H04123 Dry eye syndrome of bilateral lacrimal glands: Secondary | ICD-10-CM

## 2014-04-07 DIAGNOSIS — H5789 Other specified disorders of eye and adnexa: Secondary | ICD-10-CM | POA: Insufficient documentation

## 2014-04-07 DIAGNOSIS — H01003 Unspecified blepharitis right eye, unspecified eyelid: Secondary | ICD-10-CM

## 2014-04-07 DIAGNOSIS — H109 Unspecified conjunctivitis: Secondary | ICD-10-CM

## 2014-04-07 DIAGNOSIS — H04129 Dry eye syndrome of unspecified lacrimal gland: Secondary | ICD-10-CM

## 2014-04-07 MED ORDER — MOXIFLOXACIN HCL 0.5 % OP SOLN: 1.0000 [drp] | Freq: Four times a day (QID) | OPHTHALMIC | Status: DC

## 2014-04-07 NOTE — Progress Notes (Signed)
Pre visit review using our clinic review tool, if applicable. No additional management support is needed unless otherwise documented below in the visit note.   Chief Complaint  Patient presents with  . Eye Problem    HPI: Patient comes in today for SDA for  new problem evaluation. Here with [place] attendant care  ( daughter is out of town and sendt in message. And talked with her during visit. Breasts Brees has dry eyes and is on 2-3 drops a day on a regular basis she is also had treatment with baby shampoo on her eyelids for some blepharitis in the past. She recently was on Vigamox last month stopped. She was noted to have redder eyes at adult daycare today with some crusting and because there was an episode of conjunctivitis there was sent in for evaluation. And also appropriateness of return to daycare.  She always tends to have some photophobia no fever no major cough no change in her many nose.  ROS: See pertinent positives and negatives per HPI.  Past Medical History  Diagnosis Date  . Anemia   . Hyperlipidemia   . Hypertension   . Seizure disorder     poss from hponatremia when had pneumonia  . Recurrent UTI   . Urinary incontinence     mild  . Osteoporosis     Grade II spondylotistesis at l4-l5 testing in e[place] 2008 and confirmed that she has osteoporosis  . Spondylolisthesis of lumbar region     Grade II spondylotistesis at l4-l5 testing in [place] 2008   . Contusion of leg 08/06/2011  . Hematoma 08/06/2011    leg near shin no bov underlying bony abnormality and no infection. should take a while to resolve observe for infection   . Chest wall pain     "off and on" (05/26/2013)  . COPD (chronic obstructive pulmonary disease)   . Pleural effusion     "a few times" (05/26/2013)  . Seizures ~ 2009    "after pneumonia; electrolytes were all messed up" (05/26/2013)  . Pneumonia 09/2002; ~ 2009;   . Arthritis   . Osteoarthritis   . Thoracic compression fracture   . Thoracic back  pain   . Skin cancer 2012    leg   . Dementia     Family History  Problem Relation Age of Onset  . Mental retardation Daughter   . Hearing loss Daughter     History   Social History  . Marital Status: Widowed    Spouse Name: N/A    Number of Children: N/A  . Years of Education: N/A   Social History Main Topics  . Smoking status: Never Smoker   . Smokeless tobacco: Never Used  . Alcohol Use: No  . Drug Use: No  . Sexual Activity: No   Other Topics Concern  . None   Social History Narrative   Moved from [place] to daughter's home   [facility] of 4   Memory test conducted in 2002 and 2007   G4P4   Goes to adult day care in day    Outpatient Encounter Prescriptions as of 04/07/2014  Medication Sig  . acetaminophen (TYLENOL) 325 MG tablet Take 650 mg by mouth 2 (two) times daily as needed (pain).  Marland Kitchen aspirin EC 81 MG tablet Take 81 mg by mouth every other day.  . Calcium Carb-Cholecalciferol 500-600 MG-UNIT CHEW Chew 1 tablet by mouth 2 (two) times daily.  . cycloSPORINE (RESTASIS) 0.05 % ophthalmic emulsion Place 1 drop  into both eyes 2 (two) times daily.   Marland Kitchen donepezil (ARICEPT) 10 MG tablet Take 10 mg by mouth daily with supper.  . furosemide (LASIX) 20 MG tablet 20 mg three times weekly  . ibandronate (BONIVA) 150 MG tablet Take 150 mg by mouth every 30 (thirty) days. Take in the morning with a full glass of water, on an empty stomach, and do not take anything else by mouth or lie down for the next 30 min.; on the 1st Sunday of each month  . methenamine (HIPREX) 1 G tablet Take 1 g by mouth daily with supper.   . mirabegron ER (MYRBETRIQ) 50 MG TB24 tablet Take 50 mg by mouth daily.  Marland Kitchen OVER THE COUNTER MEDICATION Apply 1 application topically daily. Anti-fungal powder (apply to skin fold on abdomen after bath)  . Polyethyl Glycol-Propyl Glycol (SYSTANE PRESERVATIVE FREE) 0.4-0.3 % SOLN Place 1 drop into both eyes as needed (dry eyes).  . potassium chloride SA (K-DUR,KLOR-CON) 20 MEQ  tablet 20 meq three times weekly  . PRESCRIPTION MEDICATION Place 1 drop into both eyes 4 (four) times daily. Blood Serum Tears compounded at [place] 1 drop into both eyes 4 (four) times daily. acetylcysteine eye drops compounded at [place] (use with hot compress)  . Travoprost, BAK Free, (TRAVATAN) 0.004 % SOLN ophthalmic solution Place 1 drop into both eyes at bedtime.  . [DISCONTINUED] moxifloxacin (VIGAMOX) 0.5 % ophthalmic solution Place 1 drop into the left eye 2 (two) times daily. Pt has contact in eye to prevent more scarring between lid and eye - eye drops are used as long as contact is in place  . moxifloxacin (VIGAMOX) 0.5 % ophthalmic solution Place 1-2 drops into both eyes 4 (four) times daily.    EXAM:  BP 134/64  Pulse 91  Temp(Src) 99 F (37.2 C) (Oral)  SpO2 95%  There is no weight on file to calculate BMI.  GENERAL: vitals reviewed and listed above, alert, pleasant in no acute distress in wheelchair appears to recognize me., appears well hydrated  HEENT: atraumatic, conjunctiva  2+ red bulbar and lid   mild photophobia eyelids without edema but some minor yellow crusting superiorly. No perioral edema EACs clear TMs intact OP : no lesion edema or exudate  NECK: no obvious masses on inspection palpation  LUNGS: clear to auscultation bilaterally, no wheezes, rales or rhonchi,  CV: HRRR, no clubbing cyanosis  MS: moves all extremities  Skin no acute findings noted  On face  ASSESSMENT AND PLAN:  Discussed the following assessment and plan:  Red eyes  Blepharitis of both eyes  Conjunctivitis unspecified  Dry eye syndrome, bilateral  Dementia, unspecified, without behavioral disturbance Add back vigamox qid and continue eye care rx for day care to place 1 drop each eye at 10 am and 2 pm.  Ok to return to day care tomorrow . Disc plan with caretaker and daughter . considier fu eye doc if   persistent or progressive  -Patient advised to return or notify health care team  if symptoms worsen ,persist or new concerns arise.  Patient Instructions  Start back on the  Antibiotic drops  . For now.  4 x per day .  For a week and then see eye doc . If not better     Standley Brooking. Panosh M.D.   Total visit 43mins > 50% spent counseling and coordinating care  Spoke with daughter HCPOA by cell phone  Also

## 2014-04-07 NOTE — Patient Instructions (Addendum)
Start back on the  Antibiotic drops  . For now.  4 x per day .  For a week and then see eye doc . If not better

## 2014-05-04 ENCOUNTER — Emergency Department (HOSPITAL_COMMUNITY): Payer: Medicare Other

## 2014-05-04 ENCOUNTER — Encounter (HOSPITAL_COMMUNITY): Payer: Self-pay | Admitting: Emergency Medicine

## 2014-05-04 ENCOUNTER — Inpatient Hospital Stay (HOSPITAL_COMMUNITY)
Admission: EM | Admit: 2014-05-04 | Discharge: 2014-05-12 | DRG: 480 | Disposition: A | Discharge status: Skilled Nursing Facility | Payer: Medicare Other | Attending: Internal Medicine | Admitting: Internal Medicine

## 2014-05-04 DIAGNOSIS — M4316 Spondylolisthesis, lumbar region: Secondary | ICD-10-CM

## 2014-05-04 DIAGNOSIS — F0390 Unspecified dementia without behavioral disturbance: Secondary | ICD-10-CM

## 2014-05-04 DIAGNOSIS — G479 Sleep disorder, unspecified: Secondary | ICD-10-CM

## 2014-05-04 DIAGNOSIS — R32 Unspecified urinary incontinence: Secondary | ICD-10-CM

## 2014-05-04 DIAGNOSIS — J310 Chronic rhinitis: Secondary | ICD-10-CM

## 2014-05-04 DIAGNOSIS — H01006 Unspecified blepharitis left eye, unspecified eyelid: Secondary | ICD-10-CM

## 2014-05-04 DIAGNOSIS — I10 Essential (primary) hypertension: Secondary | ICD-10-CM | POA: Diagnosis present

## 2014-05-04 DIAGNOSIS — R7302 Impaired glucose tolerance (oral): Secondary | ICD-10-CM

## 2014-05-04 DIAGNOSIS — S72142K Displaced intertrochanteric fracture of left femur, subsequent encounter for closed fracture with nonunion: Secondary | ICD-10-CM

## 2014-05-04 DIAGNOSIS — Z66 Do not resuscitate: Secondary | ICD-10-CM

## 2014-05-04 DIAGNOSIS — Z862 Personal history of diseases of the blood and blood-forming organs and certain disorders involving the immune mechanism: Secondary | ICD-10-CM

## 2014-05-04 DIAGNOSIS — M25559 Pain in unspecified hip: Secondary | ICD-10-CM | POA: Diagnosis present

## 2014-05-04 DIAGNOSIS — J449 Chronic obstructive pulmonary disease, unspecified: Secondary | ICD-10-CM | POA: Diagnosis present

## 2014-05-04 DIAGNOSIS — Y92009 Unspecified place in unspecified non-institutional (private) residence as the place of occurrence of the external cause: Secondary | ICD-10-CM | POA: Diagnosis not present

## 2014-05-04 DIAGNOSIS — K5900 Constipation, unspecified: Secondary | ICD-10-CM | POA: Diagnosis present

## 2014-05-04 DIAGNOSIS — J4489 Other specified chronic obstructive pulmonary disease: Secondary | ICD-10-CM | POA: Diagnosis present

## 2014-05-04 DIAGNOSIS — H04123 Dry eye syndrome of bilateral lacrimal glands: Secondary | ICD-10-CM

## 2014-05-04 DIAGNOSIS — M810 Age-related osteoporosis without current pathological fracture: Secondary | ICD-10-CM

## 2014-05-04 DIAGNOSIS — T148XXA Other injury of unspecified body region, initial encounter: Secondary | ICD-10-CM

## 2014-05-04 DIAGNOSIS — H01003 Unspecified blepharitis right eye, unspecified eyelid: Secondary | ICD-10-CM

## 2014-05-04 DIAGNOSIS — I509 Heart failure, unspecified: Secondary | ICD-10-CM | POA: Diagnosis present

## 2014-05-04 DIAGNOSIS — G40909 Epilepsy, unspecified, not intractable, without status epilepticus: Secondary | ICD-10-CM | POA: Diagnosis present

## 2014-05-04 DIAGNOSIS — S72143A Displaced intertrochanteric fracture of unspecified femur, initial encounter for closed fracture: Secondary | ICD-10-CM | POA: Diagnosis present

## 2014-05-04 DIAGNOSIS — D649 Anemia, unspecified: Secondary | ICD-10-CM

## 2014-05-04 DIAGNOSIS — L259 Unspecified contact dermatitis, unspecified cause: Secondary | ICD-10-CM

## 2014-05-04 DIAGNOSIS — E785 Hyperlipidemia, unspecified: Secondary | ICD-10-CM

## 2014-05-04 DIAGNOSIS — D72829 Elevated white blood cell count, unspecified: Secondary | ICD-10-CM

## 2014-05-04 DIAGNOSIS — M353 Polymyalgia rheumatica: Secondary | ICD-10-CM

## 2014-05-04 DIAGNOSIS — W19XXXA Unspecified fall, initial encounter: Secondary | ICD-10-CM | POA: Diagnosis present

## 2014-05-04 DIAGNOSIS — Z85828 Personal history of other malignant neoplasm of skin: Secondary | ICD-10-CM

## 2014-05-04 DIAGNOSIS — M1990 Unspecified osteoarthritis, unspecified site: Secondary | ICD-10-CM | POA: Diagnosis present

## 2014-05-04 DIAGNOSIS — Z87440 Personal history of urinary (tract) infections: Secondary | ICD-10-CM

## 2014-05-04 DIAGNOSIS — M549 Dorsalgia, unspecified: Secondary | ICD-10-CM

## 2014-05-04 DIAGNOSIS — S72002A Fracture of unspecified part of neck of left femur, initial encounter for closed fracture: Secondary | ICD-10-CM

## 2014-05-04 DIAGNOSIS — R569 Unspecified convulsions: Secondary | ICD-10-CM

## 2014-05-04 DIAGNOSIS — R531 Weakness: Secondary | ICD-10-CM

## 2014-05-04 DIAGNOSIS — H409 Unspecified glaucoma: Secondary | ICD-10-CM

## 2014-05-04 DIAGNOSIS — F068 Other specified mental disorders due to known physiological condition: Secondary | ICD-10-CM

## 2014-05-04 DIAGNOSIS — N390 Urinary tract infection, site not specified: Secondary | ICD-10-CM

## 2014-05-04 DIAGNOSIS — H04129 Dry eye syndrome of unspecified lacrimal gland: Secondary | ICD-10-CM | POA: Diagnosis present

## 2014-05-04 DIAGNOSIS — Z8709 Personal history of other diseases of the respiratory system: Secondary | ICD-10-CM

## 2014-05-04 DIAGNOSIS — Z79899 Other long term (current) drug therapy: Secondary | ICD-10-CM | POA: Diagnosis not present

## 2014-05-04 DIAGNOSIS — I639 Cerebral infarction, unspecified: Secondary | ICD-10-CM

## 2014-05-04 DIAGNOSIS — Z87448 Personal history of other diseases of urinary system: Secondary | ICD-10-CM

## 2014-05-04 DIAGNOSIS — Z7982 Long term (current) use of aspirin: Secondary | ICD-10-CM | POA: Diagnosis not present

## 2014-05-04 DIAGNOSIS — G8190 Hemiplegia, unspecified affecting unspecified side: Secondary | ICD-10-CM | POA: Diagnosis present

## 2014-05-04 DIAGNOSIS — I6350 Cerebral infarction due to unspecified occlusion or stenosis of unspecified cerebral artery: Secondary | ICD-10-CM | POA: Diagnosis not present

## 2014-05-04 DIAGNOSIS — H5789 Other specified disorders of eye and adnexa: Secondary | ICD-10-CM

## 2014-05-04 DIAGNOSIS — R05 Cough: Secondary | ICD-10-CM

## 2014-05-04 DIAGNOSIS — D62 Acute posthemorrhagic anemia: Secondary | ICD-10-CM

## 2014-05-04 DIAGNOSIS — S72009A Fracture of unspecified part of neck of unspecified femur, initial encounter for closed fracture: Secondary | ICD-10-CM

## 2014-05-04 DIAGNOSIS — R0602 Shortness of breath: Secondary | ICD-10-CM

## 2014-05-04 DIAGNOSIS — R059 Cough, unspecified: Secondary | ICD-10-CM

## 2014-05-04 DIAGNOSIS — S72142P Displaced intertrochanteric fracture of left femur, subsequent encounter for closed fracture with malunion: Secondary | ICD-10-CM

## 2014-05-04 DIAGNOSIS — D485 Neoplasm of uncertain behavior of skin: Secondary | ICD-10-CM

## 2014-05-04 DIAGNOSIS — E871 Hypo-osmolality and hyponatremia: Secondary | ICD-10-CM

## 2014-05-04 DIAGNOSIS — I5032 Chronic diastolic (congestive) heart failure: Secondary | ICD-10-CM | POA: Diagnosis present

## 2014-05-04 DIAGNOSIS — R2681 Unsteadiness on feet: Secondary | ICD-10-CM

## 2014-05-04 DIAGNOSIS — K224 Dyskinesia of esophagus: Secondary | ICD-10-CM

## 2014-05-04 DIAGNOSIS — E876 Hypokalemia: Secondary | ICD-10-CM

## 2014-05-04 DIAGNOSIS — S72142A Displaced intertrochanteric fracture of left femur, initial encounter for closed fracture: Secondary | ICD-10-CM | POA: Diagnosis present

## 2014-05-04 DIAGNOSIS — R0789 Other chest pain: Secondary | ICD-10-CM

## 2014-05-04 DIAGNOSIS — R030 Elevated blood-pressure reading, without diagnosis of hypertension: Secondary | ICD-10-CM

## 2014-05-04 DIAGNOSIS — J90 Pleural effusion, not elsewhere classified: Secondary | ICD-10-CM

## 2014-05-04 DIAGNOSIS — N39498 Other specified urinary incontinence: Secondary | ICD-10-CM

## 2014-05-04 DIAGNOSIS — M129 Arthropathy, unspecified: Secondary | ICD-10-CM

## 2014-05-04 DIAGNOSIS — I6340 Cerebral infarction due to embolism of unspecified cerebral artery: Secondary | ICD-10-CM

## 2014-05-04 DIAGNOSIS — S7290XA Unspecified fracture of unspecified femur, initial encounter for closed fracture: Secondary | ICD-10-CM | POA: Diagnosis present

## 2014-05-04 LAB — CBC WITH DIFFERENTIAL/PLATELET
BASOS ABS: 0 10*3/uL (ref 0.0–0.1)
Basophils Relative: 0 % (ref 0–1)
Eosinophils Absolute: 0.1 10*3/uL (ref 0.0–0.7)
Eosinophils Relative: 1 % (ref 0–5)
HCT: 36.7 % (ref 36.0–46.0)
HEMOGLOBIN: 12.3 g/dL (ref 12.0–15.0)
Lymphocytes Relative: 7 % — ABNORMAL LOW (ref 12–46)
Lymphs Abs: 0.9 10*3/uL (ref 0.7–4.0)
MCH: 29.8 pg (ref 26.0–34.0)
MCHC: 33.5 g/dL (ref 30.0–36.0)
MCV: 88.9 fL (ref 78.0–100.0)
MONOS PCT: 4 % (ref 3–12)
Monocytes Absolute: 0.5 10*3/uL (ref 0.1–1.0)
NEUTROS ABS: 11.8 10*3/uL — AB (ref 1.7–7.7)
NEUTROS PCT: 88 % — AB (ref 43–77)
Platelets: 225 10*3/uL (ref 150–400)
RBC: 4.13 MIL/uL (ref 3.87–5.11)
RDW: 12.7 % (ref 11.5–15.5)
WBC: 13.3 10*3/uL — ABNORMAL HIGH (ref 4.0–10.5)

## 2014-05-04 LAB — URINALYSIS, ROUTINE W REFLEX MICROSCOPIC
Bilirubin Urine: NEGATIVE
Glucose, UA: NEGATIVE mg/dL
Hgb urine dipstick: NEGATIVE
KETONES UR: NEGATIVE mg/dL
Leukocytes, UA: NEGATIVE
NITRITE: NEGATIVE
Protein, ur: NEGATIVE mg/dL
SPECIFIC GRAVITY, URINE: 1.013 (ref 1.005–1.030)
Urobilinogen, UA: 0.2 mg/dL (ref 0.0–1.0)
pH: 6 (ref 5.0–8.0)

## 2014-05-04 LAB — COMPREHENSIVE METABOLIC PANEL
ALBUMIN: 3.7 g/dL (ref 3.5–5.2)
ALK PHOS: 81 U/L (ref 39–117)
ALT: 10 U/L (ref 0–35)
ANION GAP: 16 — AB (ref 5–15)
AST: 17 U/L (ref 0–37)
BILIRUBIN TOTAL: 0.3 mg/dL (ref 0.3–1.2)
BUN: 20 mg/dL (ref 6–23)
CHLORIDE: 92 meq/L — AB (ref 96–112)
CO2: 24 mEq/L (ref 19–32)
Calcium: 9.5 mg/dL (ref 8.4–10.5)
Creatinine, Ser: 0.72 mg/dL (ref 0.50–1.10)
GFR calc Af Amer: 83 mL/min — ABNORMAL LOW (ref >90)
GFR calc non Af Amer: 72 mL/min — ABNORMAL LOW (ref >90)
Glucose, Bld: 158 mg/dL — ABNORMAL HIGH (ref 70–99)
POTASSIUM: 3.9 meq/L (ref 3.7–5.3)
Sodium: 132 mEq/L — ABNORMAL LOW (ref 137–147)
TOTAL PROTEIN: 7.9 g/dL (ref 6.0–8.3)

## 2014-05-04 LAB — APTT: aPTT: 27 seconds (ref 24–37)

## 2014-05-04 LAB — PROTIME-INR
INR: 1.11 (ref 0.00–1.49)
Prothrombin Time: 14.3 seconds (ref 11.6–15.2)

## 2014-05-04 MED ORDER — FENTANYL CITRATE 0.05 MG/ML IJ SOLN
12.5000 ug | INTRAMUSCULAR | Status: DC | PRN
  Administered 2014-05-05 (×3): 12.5 ug via INTRAVENOUS
  Filled 2014-05-04 (×3): qty 2

## 2014-05-04 MED ORDER — CYCLOSPORINE 0.05 % OP EMUL
1.0000 [drp] | Freq: Two times a day (BID) | OPHTHALMIC | Status: DC
  Administered 2014-05-04 – 2014-05-12 (×15): 1 [drp] via OPHTHALMIC
  Filled 2014-05-04 (×18): qty 1

## 2014-05-04 MED ORDER — POLYVINYL ALCOHOL 1.4 % OP SOLN
1.0000 [drp] | OPHTHALMIC | Status: DC | PRN
  Filled 2014-05-04: qty 15

## 2014-05-04 MED ORDER — ACETAMINOPHEN 325 MG PO TABS: 650.0000 mg | ORAL_TABLET | Freq: Four times a day (QID) | ORAL | Status: DC | PRN

## 2014-05-04 MED ORDER — METHENAMINE HIPPURATE 1 G PO TABS
1.0000 g | ORAL_TABLET | Freq: Every day | ORAL | Status: DC
Start: 2014-05-04 — End: 2014-05-12
  Administered 2014-05-04 – 2014-05-11 (×6): 1 g via ORAL
  Filled 2014-05-04 (×9): qty 1

## 2014-05-04 MED ORDER — GATIFLOXACIN 0.5 % OP SOLN
1.0000 [drp] | Freq: Four times a day (QID) | OPHTHALMIC | Status: DC
  Filled 2014-05-04: qty 2.5

## 2014-05-04 MED ORDER — ONDANSETRON HCL 4 MG/2ML IJ SOLN
4.0000 mg | Freq: Three times a day (TID) | INTRAMUSCULAR | Status: AC | PRN
Start: 2014-05-04 — End: 2014-05-05

## 2014-05-04 MED ORDER — DONEPEZIL HCL 10 MG PO TABS
10.0000 mg | ORAL_TABLET | Freq: Every day | ORAL | Status: DC
  Administered 2014-05-04 – 2014-05-11 (×6): 10 mg via ORAL
  Filled 2014-05-04 (×9): qty 1

## 2014-05-04 MED ORDER — MORPHINE SULFATE 2 MG/ML IJ SOLN
2.0000 mg | INTRAMUSCULAR | Status: DC | PRN
  Administered 2014-05-04 – 2014-05-05 (×2): 1 mg via INTRAVENOUS
  Filled 2014-05-04 (×2): qty 1

## 2014-05-04 MED ORDER — SODIUM CHLORIDE 0.9 % IV SOLN
INTRAVENOUS | Status: DC
  Administered 2014-05-04 – 2014-05-05 (×2): via INTRAVENOUS

## 2014-05-04 MED ORDER — POLYETHYL GLYCOL-PROPYL GLYCOL 0.4-0.3 % OP SOLN: 1.0000 [drp] | OPHTHALMIC | Status: DC | PRN

## 2014-05-04 MED ORDER — LATANOPROST 0.005 % OP SOLN
1.0000 [drp] | Freq: Every day | OPHTHALMIC | Status: DC
  Administered 2014-05-05 – 2014-05-09 (×4): 1 [drp] via OPHTHALMIC
  Filled 2014-05-04: qty 2.5

## 2014-05-04 MED ORDER — MIRABEGRON ER 50 MG PO TB24
50.0000 mg | ORAL_TABLET | Freq: Every day | ORAL | Status: DC
  Administered 2014-05-06 – 2014-05-12 (×6): 50 mg via ORAL
  Filled 2014-05-04 (×8): qty 1

## 2014-05-04 MED ORDER — HYDROCORTISONE 0.5 % EX CREA
TOPICAL_CREAM | Freq: Two times a day (BID) | CUTANEOUS | Status: DC
  Administered 2014-05-04 – 2014-05-08 (×7): via TOPICAL
  Administered 2014-05-08: 1 via TOPICAL
  Filled 2014-05-04 (×3): qty 28.35

## 2014-05-04 MED ORDER — ACETAMINOPHEN 650 MG RE SUPP: 650.0000 mg | Freq: Four times a day (QID) | RECTAL | Status: DC | PRN

## 2014-05-04 NOTE — H&P (Signed)
History and Physical    [name] LNL:[id number] DOB: 07/30/1919 DOA: 05/04/2014  Referring physician: Dr. Tomi Bamberger PCP: [name], MD  Specialists: orthopedic surgery, Dr. [name]  Chief Complaint: leg pain  HPI: [name] is a 80 y.o. female has a past medical history significant for dementia, HLD, osteoporosis, urinary incontinence, COPD, comes to the ED after having a fall at home. She usually uses a walker and is somewhat ambulatory and has 24h supervision. She was seated and was eating today when she was by herself for few minutes when she apparently got up and fell on the floor. Patient has underlying dementia and cannot contribute to the story. Daughter denies recent fever or chills, denies that the patient complained of chest pain, shortness of breath, lightheadedness or dizziness, and apparently she is at baseline. There is a new rash on her inner thighs for which she was evaluated today by Dermatology as an outpatient and apparently she is supposed to start a steroid cream (?diagnosed with ?bullous pemphigoid per daughter). In the ED hip XR showed femur fracture, Dr. [name] was consulted and [facility] asked for admission.   Review of Systems: unable to obtain ROS  Past Medical History  Diagnosis Date  . Anemia   . Hyperlipidemia   . Hypertension   . Seizure disorder     poss from hponatremia when had pneumonia  . Recurrent UTI   . Urinary incontinence     mild  . Osteoporosis     Grade II spondylotistesis at l4-l5 testing in [place] 2008 and confirmed that she has osteoporosis  . Spondylolisthesis of lumbar region     Grade II spondylotistesis at l4-l5 testing in [place] 2008   . Contusion of leg 08/06/2011  . Hematoma 08/06/2011    leg near shin no bov underlying bony abnormality and no infection. should take a while to resolve observe for infection   . Chest wall pain     "off and on" (05/26/2013)  . COPD (chronic obstructive pulmonary disease)   . Pleural effusion     "a  few times" (05/26/2013)  . Seizures ~ 2009    "after pneumonia; electrolytes were all messed up" (05/26/2013)  . Pneumonia 09/2002; ~ 2009;   . Arthritis   . Osteoarthritis   . Thoracic compression fracture   . Thoracic back pain   . Skin cancer 2012    leg   . Dementia    Past Surgical History  Procedure Laterality Date  . Abdominal hysterectomy    . Lumbar disc surgery    . Breast lumpectomy Bilateral      had fibrofatty tissue  . Cataract extraction w/ intraocular lens implant Left   . Glaucoma surgery Left   . Eye surgery    . Skin cancer excision  2012   Social History:  reports that she has never smoked. She has never used smokeless tobacco. She reports that she does not drink alcohol or use illicit drugs.  Allergies  Allergen Reactions  . Betimol [Timolol] Swelling  . Other Swelling    Patient is also allergic to several glaucoma eye drops - causes swelling  . Codeine Nausea And Vomiting  . Gabapentin Other (See Comments)     tremors  . Sulfonamide Derivatives Hives    Family History  Problem Relation Age of Onset  . Mental retardation Daughter   . Hearing loss Daughter    Prior to Admission medications   Medication Sig Start Date End Date Taking?  Authorizing Provider  acetaminophen (TYLENOL) 325 MG tablet Take 650 mg by mouth 2 (two) times daily as needed (pain).    Historical Provider, MD  aspirin EC 81 MG tablet Take 81 mg by mouth every other day.    Historical Provider, MD  Calcium Carb-Cholecalciferol 500-600 MG-UNIT CHEW Chew 1 tablet by mouth 2 (two) times daily.    Historical Provider, MD  cycloSPORINE (RESTASIS) 0.05 % ophthalmic emulsion Place 1 drop into both eyes 2 (two) times daily.     Historical Provider, MD  donepezil (ARICEPT) 10 MG tablet Take 10 mg by mouth daily with supper.    Historical Provider, MD  furosemide (LASIX) 20 MG tablet 20 mg three times weekly 03/13/14   Burnis Medin, MD  ibandronate (BONIVA) 150 MG tablet Take 150 mg by mouth  every 30 (thirty) days. Take in the morning with a full glass of water, on an empty stomach, and do not take anything else by mouth or lie down for the next 30 min.; on the 1st Sunday of each month    Historical Provider, MD  methenamine (HIPREX) 1 G tablet Take 1 g by mouth daily with supper.  05/20/13   Historical Provider, MD  mirabegron ER (MYRBETRIQ) 50 MG TB24 tablet Take 50 mg by mouth daily.    Historical Provider, MD  moxifloxacin (VIGAMOX) 0.5 % ophthalmic solution Place 1-2 drops into both eyes 4 (four) times daily. 04/07/14   Burnis Medin, MD  OVER THE COUNTER MEDICATION Apply 1 application topically daily. Anti-fungal powder (apply to skin fold on abdomen after bath)    Historical Provider, MD  Polyethyl Glycol-Propyl Glycol (SYSTANE PRESERVATIVE FREE) 0.4-0.3 % SOLN Place 1 drop into both eyes as needed (dry eyes).    Historical Provider, MD  potassium chloride SA (K-DUR,KLOR-CON) 20 MEQ tablet 20 meq three times weekly 03/13/14   Burnis Medin, MD  PRESCRIPTION MEDICATION Place 1 drop into both eyes 4 (four) times daily. Blood Serum Tears compounded at [place] Provider, MD  [place] 1 drop into both eyes 4 (four) times daily. acetylcysteine eye drops compounded at [place] (use with hot compress)    Historical Provider, MD  Travoprost, BAK Free, (TRAVATAN) 0.004 % SOLN ophthalmic solution Place 1 drop into both eyes at bedtime.    Historical Provider, MD   Physical Exam: Filed Vitals:   05/04/14 1510 05/04/14 1511 05/04/14 1514 05/04/14 1534  BP:  154/86 154/86   Pulse:  78 80 87  Temp:  98.1 F (36.7 C) 97.8 F (36.6 C)   TempSrc:  Oral Oral   Resp:  18 16   SpO2: 98% 100% 100% 98%    General:  No apparent distress, demented  Eyes: no scleral icterus  ENT: moist oropharynx  Neck: supple, no JVD  Cardiovascular: regular rate without MRG; 2+ peripheral pulses  Respiratory: clear on anterior  auscultation, no wheezing   Abdomen: soft, non tender to palpation, positive bowel sounds  Skin: papulovesicular rash inner thighs, left arm  Musculoskeletal: no peripheral edema  Neurologic: moves all 4  Labs on Admission:  Basic Metabolic Panel:  Recent Labs Lab 05/04/14 1618  NA 132*  K 3.9  CL 92*  CO2 24  GLUCOSE 158*  BUN 20  CREATININE 0.72  CALCIUM 9.5   Liver Function Tests:  Recent Labs Lab 05/04/14 1618  AST 17  ALT 10  ALKPHOS 81  BILITOT 0.3  PROT 7.9  ALBUMIN 3.7   CBC:  Recent Labs Lab 05/04/14 1618  WBC 13.3*  NEUTROABS 11.8*  HGB 12.3  HCT 36.7  MCV 88.9  PLT 225   BNP (last 3 results)  Recent Labs  05/27/13 0100 05/28/13 0602 02/02/14 0428  PROBNP 551.6* 338.4 3497.0*   Radiological Exams on Admission: Dg Chest 1 View  05/04/2014   CLINICAL DATA:  Preoperative films history of hypertension and COPD 8  EXAM: CHEST - 1 VIEW  COMPARISON:  02/02/2014  FINDINGS: Multilevel kyphoplasty noted. Heart size upper normal. Vascular pattern normal. Calcification and mild prominence of the aortic arch stable. Lungs clear. Mitral annulus calcification noted.  IMPRESSION: No acute findings   Electronically Signed   By: Skipper Cliche M.D.   On: 05/04/2014 16:48   Dg Hip Complete Left  05/04/2014   CLINICAL DATA:  Trauma and pain.  EXAM: LEFT HIP - COMPLETE 2+ VIEW  COMPARISON:  Pelvic MRI of 07/30/2013.  FINDINGS: Moderate osteopenia. Vascular calcifications. Femoral heads are located. Comminuted intertrochanteric left femur fracture. Mild varus angulation.  IMPRESSION: Comminuted left proximal femur fracture.   Electronically Signed   By: [name] M.D.   On: 05/04/2014 16:06   Dg Ankle Complete Left  05/04/2014   CLINICAL DATA:  Fall.  Left ankle pain.  EXAM: LEFT ANKLE COMPLETE - 3+ VIEW  COMPARISON:  None.  FINDINGS: Bony demineralization. Having material obscures the lateral ankle and lateral part of the proximal foot. Reportedly these were  the best obtainable images, but sensitivity for injury is reduced.  Atherosclerosis noted. The lower calcaneus and cuboid are excluded on the lateral projection. No well-defined ankle fracture.  IMPRESSION: 1. Reduced sensitivity due to artifact and difficulty with positioning. No fracture observed. 2. Bony demineralization.   Electronically Signed   By: [name] M.D.   On: 05/04/2014 16:10   EKG: Independently reviewed. Sinus rhythm  Assessment/Plan Active Problems:   HYPERLIPIDEMIA   DEMENTIA, MILD   HYPERTENSION   Intertrochanteric fracture of left femur   Femur fracture  #1 Femur fracture - Dr. [name] consulted by EDP, appreciate input. Low dose fentanyl for pain, Acetaminophen available.  #2 Recurrent UTIs - patient on suppressive therapy with Hiprex and also on Myrbetriq. Will continue. Urinalysis currently without evidence of infection.  #3 Chronic diastolic heart failure - currently euvolemic. On Lasix three times weekly at home. Hold now.  #4 Dermatitis - will start steroid cream as instructed today per dermatology, daughter did not have time to pick it up.  #5 Osteoporosis - on outpatient tx #6 Dementia - continue home medications.  #7 Dry eyes - continue home eye drops, our formulary equivalent, advised daughter to verify with pharmacy if she wishes [place] to administer her home eye drops and let the RN know.   Diet: regular Fluids: none  DVT Prophylaxis: SCD  Code Status: DNR  Family Communication: daughter bedside  Disposition Plan: inpatient  Time spent: 67  This note has been created with Surveyor, quantity. Any transcriptional errors are unintentional.   Dietrich Ke M. Cruzita Lederer, MD [facility] Pager [phone]  If 7PM-7AM, please contact night-coverage [email or web address] Password [facility] 05/04/2014, 6:05 PM

## 2014-05-04 NOTE — ED Notes (Signed)
Patient transported to X-ray 

## 2014-05-04 NOTE — Progress Notes (Signed)
Pt arrived in stable condition to 6th floor, accompanied by daughter. Oriented to staff and environment.  Coolidge Breeze, RN 05/04/2014

## 2014-05-04 NOTE — ED Notes (Addendum)
Hx of dementia. Per ems pt is from home, unwitnessed fall in kitchen, normally uses walker with assistance, 24 hour home health was helping other daughter, so pt was trying to walk by herself. Now c/o left hip pain. 18 g L forearm. 100 mcg fentanyl given, oxygen sat decreased to 90%, pt placed on 2 L [place]. 99% on 2 L [place] upon arrival to ED. Pt in pelvic sling with outward rotation.   Daughter also reports pt has been complaining of left ankle pain. Bruising noted to left ankle.

## 2014-05-04 NOTE — ED Notes (Signed)
EKG given to Dr. Knapp. 

## 2014-05-04 NOTE — Progress Notes (Signed)
  CARE MANAGEMENT ED NOTE 05/04/2014  Patient:  [name], [name]   Account Number:  [id number]  Date Initiated:  05/04/2014  Documentation initiated by:  Livia Snellen  Subjective/Objective Assessment:   Patient presents to Ed post fall with injury to left hip     Subjective/Objective Assessment Detail:   Patient with pmhx of anemia, HTN, seizure disorder, osteoporosis, COPD, pneumonia, dementia.  Xray of left hip showed Comminuted left proximal femur fracture     Action/Plan:   Action/Plan Detail:   Anticipated DC Date:       Status Recommendation to Physician:   Result of Recommendation:    Other ED [place]  Other    Choice offered to / List presented to:  C-4 Adult Children          Status of service:  Completed, signed off  ED Comments:   ED Comments Detail:  EDCM spoke to patient's daughter Linford Arnold at bedside. [name] is patient's POA.  Patient's daughter reports the patient is not receiving any home health services at this time.  Patient's daughter reports she has hired someone personally who comes to the home five days a week for two hours inthe morning and 2-3 hours inthe pm to help patient with ADL's etc.  Patient's daughter [name] reports her sister also lives with her, who has advanced Parkinson's. [name] takes care of her sister as well.  Patient's daughter reports the patient goes to [facility] five days a week.  Patient has a walker, cane, hospital bed, shower chair, wheelchair and bedside commode at home.  Patient was treated at [facility] hospital for pneumonia from 05/26-06/02.  Patient's daughter confirms the patient's pcp is Dr. Zack Seal.  EDCM asked patient's daughter regarding discharge planning (Home or SNF)? Patient's daughter stated,"We will see how it goes." Patient's daughter agreeable to SNF consult for possible SNF placement.  EDCM provided patient's daughter with list of home health agencies in  [facility].  EDCM explained with home health, the patient may receive a visiting RN, PT, OT, aide and social worker if needed.  Patient's daughter thankful for resources.  No further EDCM needs at this time.

## 2014-05-04 NOTE — ED Notes (Signed)
Report given to Magda Paganini, RN on floor

## 2014-05-04 NOTE — ED Provider Notes (Signed)
CSN: [id number]     Arrival date & time 05/04/14  1510 History   First MD Initiated Contact with Patient 05/04/14 1547     Chief Complaint  Patient presents with  . Fall  . Hip Pain  . Ankle Pain     (Consider location/radiation/quality/duration/timing/severity/associated sxs/prior Treatment) HPI Patient lives at home with her daughter. Daughter reports that patient was eating and she left the room to assist her daughter who has mobility problems and assumed that the patient would remain seated however the patient did try to get up and she heard a thump and found her mother lying on the floor on her left side. She was complaining of pain in her left leg and was unable to get up or ambulate after she fell. There was no loss of consciousness. Patient has some tenderness of her head to palpation without obvious deformity. She is not sure if she hit her head. She had a skin tear right forearm. Daughter states that the patient only takes 1 baby aspirin every other day. She has a history of osteoporosis and has had vertebral body fractures before.  PCP Dr Regis Bill Daughters orthopedist Dr Wynelle Link  Past Medical History  Diagnosis Date  . Anemia   . Hyperlipidemia   . Hypertension   . Seizure disorder     poss from hponatremia when had pneumonia  . Recurrent UTI   . Urinary incontinence     mild  . Osteoporosis     Grade II spondylotistesis at l4-l5 testing in [place] 2008 and confirmed that she has osteoporosis  . Spondylolisthesis of lumbar region     Grade II spondylotistesis at l4-l5 testing in [place] 2008   . Contusion of leg 08/06/2011  . Hematoma 08/06/2011    leg near shin no bov underlying bony abnormality and no infection. should take a while to resolve observe for infection   . Chest wall pain     "off and on" (05/26/2013)  . COPD (chronic obstructive pulmonary disease)   . Pleural effusion     "a few times" (05/26/2013)  . Seizures ~ 2009    "after pneumonia; electrolytes were all  messed up" (05/26/2013)  . Pneumonia 09/2002; ~ 2009;   . Arthritis   . Osteoarthritis   . Thoracic compression fracture   . Thoracic back pain   . Skin cancer 2012    leg   . Dementia    Past Surgical History  Procedure Laterality Date  . Abdominal hysterectomy    . Lumbar disc surgery    . Breast lumpectomy Bilateral      had fibrofatty tissue  . Cataract extraction w/ intraocular lens implant Left   . Glaucoma surgery Left   . Eye surgery    . Skin cancer excision  2012   Family History  Problem Relation Age of Onset  . Mental retardation Daughter   . Hearing loss Daughter    History  Substance Use Topics  . Smoking status: Never Smoker   . Smokeless tobacco: Never Used  . Alcohol Use: No  lives at home Lives with daughter Uses a walker   OB History   Grav Para Term Preterm Abortions TAB SAB Ect Mult Living   4 4             Review of Systems  All other systems reviewed and are negative.     Allergies  Betimol; Other; Codeine; Gabapentin; and Sulfonamide derivatives  Home Medications   Prior to Admission  medications   Medication Sig Start Date End Date Taking? Authorizing Provider  acetaminophen (TYLENOL) 325 MG tablet Take 650 mg by mouth 2 (two) times daily as needed (pain).    Historical Provider, MD  aspirin EC 81 MG tablet Take 81 mg by mouth every other day.    Historical Provider, MD  Calcium Carb-Cholecalciferol 500-600 MG-UNIT CHEW Chew 1 tablet by mouth 2 (two) times daily.    Historical Provider, MD  cycloSPORINE (RESTASIS) 0.05 % ophthalmic emulsion Place 1 drop into both eyes 2 (two) times daily.     Historical Provider, MD  donepezil (ARICEPT) 10 MG tablet Take 10 mg by mouth daily with supper.    Historical Provider, MD  furosemide (LASIX) 20 MG tablet 20 mg three times weekly 03/13/14   Burnis Medin, MD  ibandronate (BONIVA) 150 MG tablet Take 150 mg by mouth every 30 (thirty) days. Take in the morning with a full glass of water, on an  empty stomach, and do not take anything else by mouth or lie down for the next 30 min.; on the 1st Sunday of each month    Historical Provider, MD  methenamine (HIPREX) 1 G tablet Take 1 g by mouth daily with supper.  05/20/13   Historical Provider, MD  mirabegron ER (MYRBETRIQ) 50 MG TB24 tablet Take 50 mg by mouth daily.    Historical Provider, MD  moxifloxacin (VIGAMOX) 0.5 % ophthalmic solution Place 1-2 drops into both eyes 4 (four) times daily. 04/07/14   Burnis Medin, MD  OVER THE COUNTER MEDICATION Apply 1 application topically daily. Anti-fungal powder (apply to skin fold on abdomen after bath)    Historical Provider, MD  Polyethyl Glycol-Propyl Glycol (SYSTANE PRESERVATIVE FREE) 0.4-0.3 % SOLN Place 1 drop into both eyes as needed (dry eyes).    Historical Provider, MD  potassium chloride SA (K-DUR,KLOR-CON) 20 MEQ tablet 20 meq three times weekly 03/13/14   Burnis Medin, MD  PRESCRIPTION MEDICATION Place 1 drop into both eyes 4 (four) times daily. Blood Serum Tears compounded at [place] Provider, MD  [place] 1 drop into both eyes 4 (four) times daily. acetylcysteine eye drops compounded at [place] (use with hot compress)    Historical Provider, MD  Travoprost, BAK Free, (TRAVATAN) 0.004 % SOLN ophthalmic solution Place 1 drop into both eyes at bedtime.    Historical Provider, MD   BP 154/86  Pulse 87  Temp(Src) 97.8 F (36.6 C) (Oral)  Resp 16  SpO2 98%  Vital signs normal   Physical Exam  Nursing note and vitals reviewed. Constitutional: She is oriented to person, place, and time. She appears well-developed and well-nourished.  Non-toxic appearance. She does not appear ill. No distress.  HENT:  Head: Normocephalic and atraumatic.  Right Ear: External ear normal.  Left Ear: External ear normal.  Nose: Nose normal. No mucosal edema or rhinorrhea.  Mouth/Throat: Oropharynx is clear and moist and mucous  membranes are normal. No dental abscesses or uvula swelling.  Eyes: Conjunctivae and EOM are normal. Pupils are equal, round, and reactive to light.  Neck: Normal range of motion and full passive range of motion without pain. Neck supple.  Cardiovascular: Normal rate, regular rhythm and normal heart sounds.  Exam reveals no gallop and no friction rub.   No murmur heard. Pulmonary/Chest: Effort normal and breath sounds normal. No respiratory distress. She has no wheezes. She has no rhonchi. She has no rales.  She exhibits no tenderness and no crepitus.  Abdominal: Soft. Normal appearance and bowel sounds are normal. She exhibits no distension. There is no tenderness. There is no rebound and no guarding.  Musculoskeletal: She exhibits edema and tenderness.  Painful in the left hip. Has some bruising of the medial aspect of the left lower leg just superior to the ankle. Good distal pulses.   Neurological: She is alert and oriented to person, place, and time. She has normal strength. No cranial nerve deficit.  Skin: Skin is warm, dry and intact. No rash noted. No erythema. No pallor.  Pt has a skin tear on her right forearm  Psychiatric: She has a normal mood and affect. Her speech is normal and behavior is normal. Her mood appears not anxious.    ED Course  Procedures (including critical care time)  Medications  acetaminophen (TYLENOL) tablet 650 mg (not administered)    Or  acetaminophen (TYLENOL) suppository 650 mg (not administered)  fentaNYL (SUBLIMAZE) injection 12.5 mcg (not administered)  hydrocortisone cream 0.5 % (not administered)    17:08 Dr [name], will plan to do surgery tomorrow  17:18 Dr Cruzita Lederer, admit to med-surg, team 8    Labs Review  Results for orders placed during the hospital encounter of 05/04/14  CBC WITH DIFFERENTIAL      Result Value Ref Range   WBC 13.3 (*) 4.0 - 10.5 K/uL   RBC 4.13  3.87 - 5.11 MIL/uL   Hemoglobin 12.3  12.0 - 15.0 g/dL   HCT 36.7  36.0  - 46.0 %   MCV 88.9  78.0 - 100.0 fL   MCH 29.8  26.0 - 34.0 pg   MCHC 33.5  30.0 - 36.0 g/dL   RDW 12.7  11.5 - 15.5 %   Platelets 225  150 - 400 K/uL   Neutrophils Relative % 88 (*) 43 - 77 %   Neutro Abs 11.8 (*) 1.7 - 7.7 K/uL   Lymphocytes Relative 7 (*) 12 - 46 %   Lymphs Abs 0.9  0.7 - 4.0 K/uL   Monocytes Relative 4  3 - 12 %   Monocytes Absolute 0.5  0.1 - 1.0 K/uL   Eosinophils Relative 1  0 - 5 %   Eosinophils Absolute 0.1  0.0 - 0.7 K/uL   Basophils Relative 0  0 - 1 %   Basophils Absolute 0.0  0.0 - 0.1 K/uL  COMPREHENSIVE METABOLIC PANEL      Result Value Ref Range   Sodium 132 (*) 137 - 147 mEq/L   Potassium 3.9  3.7 - 5.3 mEq/L   Chloride 92 (*) 96 - 112 mEq/L   CO2 24  19 - 32 mEq/L   Glucose, Bld 158 (*) 70 - 99 mg/dL   BUN 20  6 - 23 mg/dL   Creatinine, Ser 0.72  0.50 - 1.10 mg/dL   Calcium 9.5  8.4 - 10.5 mg/dL   Total Protein 7.9  6.0 - 8.3 g/dL   Albumin 3.7  3.5 - 5.2 g/dL   AST 17  0 - 37 U/L   ALT 10  0 - 35 U/L   Alkaline Phosphatase 81  39 - 117 U/L   Total Bilirubin 0.3  0.3 - 1.2 mg/dL   GFR calc non Af Amer 72 (*) >90 mL/min   GFR calc Af Amer 83 (*) >90 mL/min   Anion gap 16 (*) 5 - 15  APTT      Result Value Ref Range  aPTT 27  24 - 37 seconds  PROTIME-INR      Result Value Ref Range   Prothrombin Time 14.3  11.6 - 15.2 seconds   INR 1.11  0.00 - 1.49  SAMPLE TO BLOOD BANK      Result Value Ref Range   Blood Bank Specimen SAMPLE AVAILABLE FOR TESTING     Sample Expiration 05/07/2014      Laboratory interpretation all normal except leukocytosis, low sodium, hyperglycemia    Imaging Review Dg Chest 1 View  05/04/2014   CLINICAL DATA:  Preoperative films history of hypertension and COPD 8  EXAM: CHEST - 1 VIEW  COMPARISON:  02/02/2014  FINDINGS: Multilevel kyphoplasty noted. Heart size upper normal. Vascular pattern normal. Calcification and mild prominence of the aortic arch stable. Lungs clear. Mitral annulus calcification noted.   IMPRESSION: No acute findings   Electronically Signed   By: Skipper Cliche M.D.   On: 05/04/2014 16:48   Dg Hip Complete Left  05/04/2014   CLINICAL DATA:  Trauma and pain.  EXAM: LEFT HIP - COMPLETE 2+ VIEW  COMPARISON:  Pelvic MRI of 07/30/2013.  FINDINGS: Moderate osteopenia. Vascular calcifications. Femoral heads are located. Comminuted intertrochanteric left femur fracture. Mild varus angulation.  IMPRESSION: Comminuted left proximal femur fracture.   Electronically Signed   By: [name] M.D.   On: 05/04/2014 16:06   Dg Ankle Complete Left  05/04/2014   CLINICAL DATA:  Fall.  Left ankle pain.  EXAM: LEFT ANKLE COMPLETE - 3+ VIEW  COMPARISON:  None.  FINDINGS: Bony demineralization. Having material obscures the lateral ankle and lateral part of the proximal foot. Reportedly these were the best obtainable images, but sensitivity for injury is reduced.  Atherosclerosis noted. The lower calcaneus and cuboid are excluded on the lateral projection. No well-defined ankle fracture.  IMPRESSION: 1. Reduced sensitivity due to artifact and difficulty with positioning. No fracture observed. 2. Bony demineralization.   Electronically Signed   By: [name] M.D.   On: 05/04/2014 16:10     EKG Interpretation   Date/Time:  Monday May 04 2014 17:59:37 EDT Ventricular Rate:  87 PR Interval:  210 QRS Duration: 89 QT Interval:  399 QTC Calculation: 480 R Axis:   -5 Text Interpretation:  Sinus rhythm Left ventricular hypertrophy  Nonspecific T abnormalities, inferior leads Baseline wander in lead(s) II  Since last tracing 29 Jan 2014 Normal sinus rhythm has replaced Atrial  fibrillation Confirmed by Tassie Pollett  MD-I, Dash Cardarelli ([id number]) on 05/04/2014 6:17:17 PM      MDM   Final diagnoses:  Fracture, proximal femur, left, closed, initial encounter    Plan admission  Rolland Porter, MD, Alanson Aly, MD 05/04/14 [phone]

## 2014-05-04 NOTE — ED Notes (Signed)
Bed: [facility] Expected date:  Expected time:  Means of arrival:  Comments: EMS-fall-hip pain

## 2014-05-05 ENCOUNTER — Encounter (HOSPITAL_COMMUNITY)
Admission: EM | Disposition: A | Discharge status: Skilled Nursing Facility | Payer: Self-pay | Source: Home / Self Care | Attending: Internal Medicine

## 2014-05-05 ENCOUNTER — Inpatient Hospital Stay (HOSPITAL_COMMUNITY): Payer: Medicare Other

## 2014-05-05 ENCOUNTER — Encounter (HOSPITAL_COMMUNITY): Payer: Self-pay | Admitting: Certified Registered"

## 2014-05-05 ENCOUNTER — Ambulatory Visit: Payer: Medicare Other | Admitting: Internal Medicine

## 2014-05-05 ENCOUNTER — Inpatient Hospital Stay (HOSPITAL_COMMUNITY): Payer: Medicare Other | Admitting: Anesthesiology

## 2014-05-05 ENCOUNTER — Encounter (HOSPITAL_COMMUNITY): Payer: Medicare Other | Admitting: Anesthesiology

## 2014-05-05 DIAGNOSIS — D72829 Elevated white blood cell count, unspecified: Secondary | ICD-10-CM

## 2014-05-05 DIAGNOSIS — F0390 Unspecified dementia without behavioral disturbance: Secondary | ICD-10-CM

## 2014-05-05 DIAGNOSIS — E871 Hypo-osmolality and hyponatremia: Secondary | ICD-10-CM

## 2014-05-05 DIAGNOSIS — D649 Anemia, unspecified: Secondary | ICD-10-CM

## 2014-05-05 HISTORY — PX: FEMUR IM NAIL: SHX1597

## 2014-05-05 LAB — CBC
HEMATOCRIT: 29.2 % — AB (ref 36.0–46.0)
Hemoglobin: 10 g/dL — ABNORMAL LOW (ref 12.0–15.0)
MCH: 29.8 pg (ref 26.0–34.0)
MCHC: 34.2 g/dL (ref 30.0–36.0)
MCV: 86.9 fL (ref 78.0–100.0)
Platelets: 213 10*3/uL (ref 150–400)
RBC: 3.36 MIL/uL — AB (ref 3.87–5.11)
RDW: 12.8 % (ref 11.5–15.5)
WBC: 11.4 10*3/uL — ABNORMAL HIGH (ref 4.0–10.5)

## 2014-05-05 LAB — COMPREHENSIVE METABOLIC PANEL
ALBUMIN: 3.1 g/dL — AB (ref 3.5–5.2)
ALK PHOS: 66 U/L (ref 39–117)
ALT: 10 U/L (ref 0–35)
AST: 17 U/L (ref 0–37)
Anion gap: 12 (ref 5–15)
BUN: 23 mg/dL (ref 6–23)
CALCIUM: 8.8 mg/dL (ref 8.4–10.5)
CO2: 25 mEq/L (ref 19–32)
Chloride: 98 mEq/L (ref 96–112)
Creatinine, Ser: 0.61 mg/dL (ref 0.50–1.10)
GFR calc Af Amer: 88 mL/min — ABNORMAL LOW (ref >90)
GFR calc non Af Amer: 76 mL/min — ABNORMAL LOW (ref >90)
Glucose, Bld: 152 mg/dL — ABNORMAL HIGH (ref 70–99)
POTASSIUM: 4 meq/L (ref 3.7–5.3)
Sodium: 135 mEq/L — ABNORMAL LOW (ref 137–147)
TOTAL PROTEIN: 6.8 g/dL (ref 6.0–8.3)
Total Bilirubin: 0.3 mg/dL (ref 0.3–1.2)

## 2014-05-05 LAB — MRSA PCR SCREENING: MRSA BY PCR: NEGATIVE

## 2014-05-05 SURGERY — INSERTION, INTRAMEDULLARY ROD, FEMUR
Anesthesia: General | Site: Hip | Laterality: Left

## 2014-05-05 MED ORDER — SODIUM CHLORIDE 0.9 % IV SOLN
INTRAVENOUS | Status: DC
  Administered 2014-05-06 – 2014-05-12 (×7): via INTRAVENOUS
  Filled 2014-05-05 (×16): qty 1000

## 2014-05-05 MED ORDER — ESMOLOL HCL 10 MG/ML IV SOLN
INTRAVENOUS | Status: AC
  Filled 2014-05-05: qty 10

## 2014-05-05 MED ORDER — TRAMADOL HCL 50 MG PO TABS
50.0000 mg | ORAL_TABLET | Freq: Four times a day (QID) | ORAL | Status: DC | PRN
  Administered 2014-05-06 – 2014-05-07 (×3): 50 mg via ORAL
  Filled 2014-05-05 (×3): qty 1

## 2014-05-05 MED ORDER — HYDROCODONE-ACETAMINOPHEN 5-325 MG PO TABS
1.0000 | ORAL_TABLET | Freq: Four times a day (QID) | ORAL | Status: DC | PRN
  Administered 2014-05-07 – 2014-05-08 (×3): 1 via ORAL
  Filled 2014-05-05 (×3): qty 1

## 2014-05-05 MED ORDER — CEFAZOLIN SODIUM-DEXTROSE 2-3 GM-% IV SOLR
INTRAVENOUS | Status: AC
  Filled 2014-05-05: qty 50

## 2014-05-05 MED ORDER — PROPOFOL 10 MG/ML IV BOLUS
INTRAVENOUS | Status: DC | PRN
  Administered 2014-05-05: 850 mg via INTRAVENOUS

## 2014-05-05 MED ORDER — FENTANYL CITRATE 0.05 MG/ML IJ SOLN
INTRAMUSCULAR | Status: DC | PRN
  Administered 2014-05-05 (×2): 50 ug via INTRAVENOUS

## 2014-05-05 MED ORDER — ENOXAPARIN SODIUM 40 MG/0.4ML ~~LOC~~ SOLN
40.0000 mg | SUBCUTANEOUS | Status: DC
  Administered 2014-05-06 – 2014-05-12 (×7): 40 mg via SUBCUTANEOUS
  Filled 2014-05-05 (×7): qty 0.4

## 2014-05-05 MED ORDER — PROMETHAZINE HCL 25 MG/ML IJ SOLN: 6.2500 mg | INTRAMUSCULAR | Status: DC | PRN

## 2014-05-05 MED ORDER — ONDANSETRON HCL 4 MG PO TABS
4.0000 mg | ORAL_TABLET | Freq: Four times a day (QID) | ORAL | Status: DC | PRN
Start: 2014-05-05 — End: 2014-05-12

## 2014-05-05 MED ORDER — TRAMADOL-ACETAMINOPHEN 37.5-325 MG PO TABS: 1.0000 | ORAL_TABLET | Freq: Four times a day (QID) | ORAL | Status: DC | PRN

## 2014-05-05 MED ORDER — FENTANYL CITRATE 0.05 MG/ML IJ SOLN
INTRAMUSCULAR | Status: AC
  Filled 2014-05-05: qty 2

## 2014-05-05 MED ORDER — LIDOCAINE HCL (PF) 2 % IJ SOLN
INTRAMUSCULAR | Status: DC | PRN
  Administered 2014-05-05: 20 mg via INTRADERMAL

## 2014-05-05 MED ORDER — METOCLOPRAMIDE HCL 5 MG/ML IJ SOLN: 5.0000 mg | Freq: Three times a day (TID) | INTRAMUSCULAR | Status: DC | PRN

## 2014-05-05 MED ORDER — ACETAMINOPHEN 650 MG RE SUPP: 650.0000 mg | Freq: Four times a day (QID) | RECTAL | Status: DC | PRN

## 2014-05-05 MED ORDER — LIDOCAINE HCL (CARDIAC) 20 MG/ML IV SOLN
INTRAVENOUS | Status: AC
  Filled 2014-05-05: qty 5

## 2014-05-05 MED ORDER — DOCUSATE SODIUM 100 MG PO CAPS
100.0000 mg | ORAL_CAPSULE | Freq: Every day | ORAL | Status: DC
  Administered 2014-05-06 – 2014-05-12 (×6): 100 mg via ORAL
  Filled 2014-05-05 (×4): qty 1

## 2014-05-05 MED ORDER — ENOXAPARIN SODIUM 40 MG/0.4ML ~~LOC~~ SOLN: 40.0000 mg | SUBCUTANEOUS | Status: DC

## 2014-05-05 MED ORDER — METOCLOPRAMIDE HCL 10 MG PO TABS: 5.0000 mg | ORAL_TABLET | Freq: Three times a day (TID) | ORAL | Status: DC | PRN

## 2014-05-05 MED ORDER — ONDANSETRON HCL 4 MG/2ML IJ SOLN
INTRAMUSCULAR | Status: AC
  Filled 2014-05-05: qty 2

## 2014-05-05 MED ORDER — PHENOL 1.4 % MT LIQD
1.0000 | OROMUCOSAL | Status: DC | PRN
  Filled 2014-05-05: qty 177

## 2014-05-05 MED ORDER — FENTANYL CITRATE 0.05 MG/ML IJ SOLN: 50.0000 ug | INTRAMUSCULAR | Status: DC | PRN

## 2014-05-05 MED ORDER — ONDANSETRON HCL 4 MG/2ML IJ SOLN
INTRAMUSCULAR | Status: DC | PRN
  Administered 2014-05-05: 4 mg via INTRAVENOUS

## 2014-05-05 MED ORDER — FENTANYL CITRATE 0.05 MG/ML IJ SOLN
50.0000 ug | Freq: Once | INTRAMUSCULAR | Status: AC
  Administered 2014-05-05: 50 ug via INTRAVENOUS
  Filled 2014-05-05: qty 2

## 2014-05-05 MED ORDER — CEFAZOLIN SODIUM-DEXTROSE 2-3 GM-% IV SOLR
2.0000 g | Freq: Once | INTRAVENOUS | Status: AC
  Administered 2014-05-05: 2 g via INTRAVENOUS

## 2014-05-05 MED ORDER — LABETALOL HCL 5 MG/ML IV SOLN
INTRAVENOUS | Status: AC
  Filled 2014-05-05: qty 4

## 2014-05-05 MED ORDER — FENTANYL CITRATE 0.05 MG/ML IJ SOLN: 25.0000 ug | INTRAMUSCULAR | Status: DC | PRN

## 2014-05-05 MED ORDER — ACETAMINOPHEN 325 MG PO TABS
650.0000 mg | ORAL_TABLET | Freq: Four times a day (QID) | ORAL | Status: DC | PRN
Start: 2014-05-05 — End: 2014-05-12
  Administered 2014-05-06 (×2): 650 mg via ORAL
  Filled 2014-05-05 (×2): qty 2

## 2014-05-05 MED ORDER — PHENYLEPHRINE HCL 10 MG/ML IJ SOLN
INTRAMUSCULAR | Status: DC | PRN
  Administered 2014-05-05 (×2): 80 ug via INTRAVENOUS

## 2014-05-05 MED ORDER — ROCURONIUM BROMIDE 100 MG/10ML IV SOLN
INTRAVENOUS | Status: AC
  Filled 2014-05-05: qty 1

## 2014-05-05 MED ORDER — MENTHOL 3 MG MT LOZG
1.0000 | LOZENGE | OROMUCOSAL | Status: DC | PRN
  Filled 2014-05-05: qty 9

## 2014-05-05 MED ORDER — ESMOLOL HCL 10 MG/ML IV SOLN
INTRAVENOUS | Status: DC | PRN
  Administered 2014-05-05: 15 mg via INTRAVENOUS

## 2014-05-05 MED ORDER — PROPOFOL 10 MG/ML IV BOLUS
INTRAVENOUS | Status: AC
  Filled 2014-05-05: qty 20

## 2014-05-05 MED ORDER — SUCCINYLCHOLINE CHLORIDE 20 MG/ML IJ SOLN
INTRAMUSCULAR | Status: DC | PRN
  Administered 2014-05-05: 80 mg via INTRAVENOUS

## 2014-05-05 MED ORDER — ONDANSETRON HCL 4 MG/2ML IJ SOLN
4.0000 mg | Freq: Four times a day (QID) | INTRAMUSCULAR | Status: DC | PRN
  Administered 2014-05-06: 4 mg via INTRAVENOUS
  Filled 2014-05-05: qty 2

## 2014-05-05 SURGICAL SUPPLY — 40 items
BAG SPEC THK2 15X12 ZIP CLS (MISCELLANEOUS) ×1
BAG ZIPLOCK 12X15 (MISCELLANEOUS) ×3 IMPLANT
BIT DRILL CANN LG 4.3MM (BIT) IMPLANT
BNDG GAUZE ELAST 4 BULKY (GAUZE/BANDAGES/DRESSINGS) ×3 IMPLANT
DRAPE INCISE IOBAN 66X45 STRL (DRAPES) ×3 IMPLANT
DRAPE STERI IOBAN 125X83 (DRAPES) ×3 IMPLANT
DRILL BIT CANN LG 4.3MM (BIT) ×3
DRSG AQUACEL AG ADV 3.5X 4 (GAUZE/BANDAGES/DRESSINGS) ×6 IMPLANT
DRSG AQUACEL AG ADV 3.5X 6 (GAUZE/BANDAGES/DRESSINGS) ×3 IMPLANT
DRSG MEPILEX BORDER 4X12 (GAUZE/BANDAGES/DRESSINGS) ×2 IMPLANT
DRSG MEPILEX BORDER 4X4 (GAUZE/BANDAGES/DRESSINGS) ×2 IMPLANT
DRSG PAD ABDOMINAL 8X10 ST (GAUZE/BANDAGES/DRESSINGS) ×6 IMPLANT
DURAPREP 26ML APPLICATOR (WOUND CARE) ×3 IMPLANT
ELECT REM PT RETURN 9FT ADLT (ELECTROSURGICAL) ×3
ELECTRODE REM PT RTRN 9FT ADLT (ELECTROSURGICAL) ×1 IMPLANT
GAUZE SPONGE 4X4 12PLY STRL (GAUZE/BANDAGES/DRESSINGS) ×3 IMPLANT
GLOVE BIOGEL PI IND STRL 7.5 (GLOVE) ×1 IMPLANT
GLOVE BIOGEL PI IND STRL 8.5 (GLOVE) ×1 IMPLANT
GLOVE BIOGEL PI INDICATOR 7.5 (GLOVE) ×2
GLOVE BIOGEL PI INDICATOR 8.5 (GLOVE) ×2
GLOVE ECLIPSE 8.0 STRL XLNG CF (GLOVE) IMPLANT
GLOVE ORTHO TXT STRL SZ7.5 (GLOVE) ×6 IMPLANT
GLOVE SURG ORTHO 8.0 STRL STRW (GLOVE) ×3 IMPLANT
GOWN SPEC L3 XXLG W/TWL (GOWN DISPOSABLE) ×6 IMPLANT
GOWN STRL REUS W/TWL LRG LVL3 (GOWN DISPOSABLE) ×3 IMPLANT
GUIDEPIN 3.2X17.5 THRD DISP (PIN) ×2 IMPLANT
GUIDEWIRE BALL NOSE 80CM (WIRE) ×2 IMPLANT
HFN 125 DEG 11MM X 180MM (Orthopedic Implant) ×2 IMPLANT
KIT BASIN OR (CUSTOM PROCEDURE TRAY) ×3 IMPLANT
MANIFOLD NEPTUNE II (INSTRUMENTS) ×3 IMPLANT
PACK GENERAL/GYN (CUSTOM PROCEDURE TRAY) ×3 IMPLANT
POSITIONER SURGICAL ARM (MISCELLANEOUS) ×3 IMPLANT
SCREW BONE CORTICAL 5.0X36 (Screw) ×2 IMPLANT
SCREW LAG HIP NAIL 10.5X95 (Screw) ×2 IMPLANT
STAPLER VISISTAT 35W (STAPLE) ×3 IMPLANT
SUT VIC AB 1 CT1 27 (SUTURE) ×3
SUT VIC AB 1 CT1 27XBRD ANTBC (SUTURE) ×1 IMPLANT
SUT VIC AB 2-0 CT1 27 (SUTURE) ×6
SUT VIC AB 2-0 CT1 27XBRD (SUTURE) ×2 IMPLANT
TOWEL OR 17X26 10 PK STRL BLUE (TOWEL DISPOSABLE) ×6 IMPLANT

## 2014-05-05 NOTE — Consult Note (Signed)
Reason for Consult: Left hip intertrochanteric femur fracture  Referring Physician: Sheran Fava, MD  [name] is an 80 y.o. female.  HPI: [name] is a 80 y.o. female has a past medical history significant for dementia, HLD, osteoporosis, urinary incontinence, COPD, comes to the ED after having a fall at home. She usually uses a walker and is somewhat ambulatory and has 24h supervision. She was seated and was eating today when she was by herself for few minutes when she apparently got up and fell on the floor. Patient has underlying dementia and cannot contribute to the story. Daughter denies recent fever or chills, denies that the patient complained of chest pain, shortness of breath, lightheadedness or dizziness, and apparently she is at baseline. There is a new rash on her inner thighs for which she was evaluated today by Dermatology as an outpatient and apparently she is supposed to start a steroid cream (?diagnosed with ?bullous pemphigoid per daughter). In the ED hip XR showed femur fracture, [place] consulted base on family care at our office.  Admitted to medical service    Past Medical History  Diagnosis Date  . Anemia   . Hyperlipidemia   . Hypertension   . Seizure disorder     poss from hponatremia when had pneumonia  . Recurrent UTI   . Urinary incontinence     mild  . Osteoporosis     Grade II spondylotistesis at l4-l5 testing in [place] 2008 and confirmed that she has osteoporosis  . Spondylolisthesis of lumbar region     Grade II spondylotistesis at l4-l5 testing in [place] 2008   . Contusion of leg 08/06/2011  . Hematoma 08/06/2011    leg near shin no bov underlying bony abnormality and no infection. should take a while to resolve observe for infection   . Chest wall pain     "off and on" (05/26/2013)  . COPD (chronic obstructive pulmonary disease)   . Pleural effusion     "a few times" (05/26/2013)  . Seizures ~ 2009    "after pneumonia; electrolytes were  all messed up" (05/26/2013)  . Pneumonia 09/2002; ~ 2009;   . Arthritis   . Osteoarthritis   . Thoracic compression fracture   . Thoracic back pain   . Skin cancer 2012    leg   . Dementia     Past Surgical History  Procedure Laterality Date  . Abdominal hysterectomy    . Lumbar disc surgery    . Breast lumpectomy Bilateral      had fibrofatty tissue  . Cataract extraction w/ intraocular lens implant Left   . Glaucoma surgery Left   . Eye surgery    . Skin cancer excision  2012    Family History  Problem Relation Age of Onset  . Mental retardation Daughter   . Hearing loss Daughter     Social History:  reports that she has never smoked. She has never used smokeless tobacco. She reports that she does not drink alcohol or use illicit drugs.  Allergies:  Allergies  Allergen Reactions  . Betimol [Timolol] Swelling  . Other Swelling    Patient is also allergic to several glaucoma eye drops - causes swelling  . Codeine Nausea And Vomiting  . Gabapentin Other (See Comments)     tremors  . Sulfonamide Derivatives Hives    Medications:  I have reviewed the patient's current medications. Scheduled: . [MAR HOLD] cycloSPORINE  1 drop Both Eyes BID  . [  MAR HOLD] donepezil  10 mg Oral Q supper  . [MAR HOLD] hydrocortisone cream   Topical BID  . [MAR HOLD] latanoprost  1 drop Both Eyes QHS  . [MAR HOLD] methenamine  1 g Oral Q supper  . [facility] HOLD] mirabegron ER  50 mg Oral Daily    Results for orders placed during the hospital encounter of 05/04/14 (from the past 24 hour(s))  URINALYSIS, ROUTINE W REFLEX MICROSCOPIC     Status: Abnormal   Collection Time    05/04/14  6:02 PM      Result Value Ref Range   Color, Urine YELLOW  YELLOW   APPearance CLOUDY (*) CLEAR   Specific Gravity, Urine 1.013  1.005 - 1.030   pH 6.0  5.0 - 8.0   Glucose, UA NEGATIVE  NEGATIVE mg/dL   Hgb urine dipstick NEGATIVE  NEGATIVE   Bilirubin Urine NEGATIVE  NEGATIVE   Ketones, ur NEGATIVE   NEGATIVE mg/dL   Protein, ur NEGATIVE  NEGATIVE mg/dL   Urobilinogen, UA 0.2  0.0 - 1.0 mg/dL   Nitrite NEGATIVE  NEGATIVE   Leukocytes, UA NEGATIVE  NEGATIVE  COMPREHENSIVE METABOLIC PANEL     Status: Abnormal   Collection Time    05/05/14  5:28 AM      Result Value Ref Range   Sodium 135 (*) 137 - 147 mEq/L   Potassium 4.0  3.7 - 5.3 mEq/L   Chloride 98  96 - 112 mEq/L   CO2 25  19 - 32 mEq/L   Glucose, Bld 152 (*) 70 - 99 mg/dL   BUN 23  6 - 23 mg/dL   Creatinine, Ser 0.61  0.50 - 1.10 mg/dL   Calcium 8.8  8.4 - 10.5 mg/dL   Total Protein 6.8  6.0 - 8.3 g/dL   Albumin 3.1 (*) 3.5 - 5.2 g/dL   AST 17  0 - 37 U/L   ALT 10  0 - 35 U/L   Alkaline Phosphatase 66  39 - 117 U/L   Total Bilirubin 0.3  0.3 - 1.2 mg/dL   GFR calc non Af Amer 76 (*) >90 mL/min   GFR calc Af Amer 88 (*) >90 mL/min   Anion gap 12  5 - 15  CBC     Status: Abnormal   Collection Time    05/05/14  5:28 AM      Result Value Ref Range   WBC 11.4 (*) 4.0 - 10.5 K/uL   RBC 3.36 (*) 3.87 - 5.11 MIL/uL   Hemoglobin 10.0 (*) 12.0 - 15.0 g/dL   HCT 29.2 (*) 36.0 - 46.0 %   MCV 86.9  78.0 - 100.0 fL   MCH 29.8  26.0 - 34.0 pg   MCHC 34.2  30.0 - 36.0 g/dL   RDW 12.8  11.5 - 15.5 %   Platelets 213  150 - 400 K/uL    X-ray: CLINICAL DATA: Trauma and pain.  EXAM:  LEFT HIP - COMPLETE 2+ VIEW  COMPARISON: Pelvic MRI of 07/30/2013.  FINDINGS:  Moderate osteopenia. Vascular calcifications. Femoral heads are  located. Comminuted intertrochanteric left femur fracture. Mild  varus angulation.  IMPRESSION:  Comminuted left proximal femur fracture.   ROS Dementia  COPD  No recent hospitalizations Lives at home with family  Blood pressure 165/71, pulse 103, temperature 99.4 F (37.4 C), temperature source Oral, resp. rate 18, height 4\' 11"  (1.499 m), weight 61.9 kg (136 lb 7.4 oz), SpO2 96.00%.  Physical Exam Awake alert,  confused at baseline  General medical exam reviewed but deferred to admission  Hospitalist service Left lower extremity, slightly shortened, ER  Assessment/Plan: Left proximal femur fracture  NPO  To OR tonight for ORIF left proximal femur Ancef on call to [place] D 05/05/2014, 5:56 PM

## 2014-05-05 NOTE — Interval H&P Note (Signed)
History and Physical Interval Note:  05/05/2014 5:56 PM  [name]  has presented today for surgery, with the diagnosis of left trochanteric fracture  The various methods of treatment have been discussed with the patient and family. After consideration of risks, benefits and other options for treatment, the patient has consented to  Procedure(s): INTRAMEDULLARY (IM) NAIL FEMORAL (Left) as a surgical intervention .  The patient's history has been reviewed, patient examined, no change in status, stable for surgery.  I have reviewed the patient's chart and labs.  Questions were answered to the patient's satisfaction.     [name]

## 2014-05-05 NOTE — Progress Notes (Signed)
INITIAL NUTRITION ASSESSMENT  DOCUMENTATION CODES Per approved criteria  -Not Applicable   INTERVENTION: - Diet advancement per MD, recommend dysphagia 3/thin as pt on this PTA - Daughter aware of importance of nutrition in overall health and says she will add nutritional supplements to pt's diet if her appetite is poor after surgery - RD to continue to monitor   NUTRITION DIAGNOSIS: Inadequate oral intake related to inability to eat as evidenced by NPO.   Goal: Advance diet as tolerated to dysphagia 3/thin liquid diet  Monitor:  Weights, labs, diet advancement  Reason for Assessment: Low braden  80 y.o. female  Admitting Dx: Leg pain  ASSESSMENT: Pt with history significant for dementia, HLD, osteoporosis, urinary incontinence, COPD, comes to the ED after having a fall at home, found to have intertrochanteric fracture of left femur.  - Pt resting, daughter in room - Reports pt on dysphagia 3/thin liquid diet at home with good appetite, eating 3 well balanced meals/day  - Not on any nutritional supplements - Daughter reports pt with stable weight   Height: Ht Readings from Last 1 Encounters:  05/05/14 4\' 11"  (1.499 m)    Weight: Wt Readings from Last 1 Encounters:  05/05/14 136 lb 7.4 oz (61.9 kg)    Ideal Body Weight: 98 lbs  % Ideal Body Weight: 139%  Wt Readings from Last 10 Encounters:  05/05/14 136 lb 7.4 oz (61.9 kg)  05/05/14 136 lb 7.4 oz (61.9 kg)  01/28/14 134 lb 7.7 oz (61 kg)  06/06/13 137 lb (62.143 kg)  05/29/13 139 lb 14.4 oz (63.458 kg)  04/07/13 138 lb (62.596 kg)  12/19/12 143 lb (64.864 kg)  09/30/12 140 lb (63.504 kg)  09/27/12 142 lb (64.411 kg)  08/30/12 144 lb (65.318 kg)    Usual Body Weight: 136 lbs   % Usual Body Weight: 100%  BMI:  Body mass index is 27.55 kg/(m^2).  Estimated Nutritional Needs: Kcal: 1300-1500 Protein: 55-70g Fluid: 1.3-1.5L/day   Skin: Right arm skin tear  Diet Order: NPO  EDUCATION NEEDS: -No  education needs identified at this time   Intake/Output Summary (Last 24 hours) at 05/05/14 1255 Last data filed at 05/05/14 1216  Gross per 24 hour  Intake 556.67 ml  Output    850 ml  Net -293.33 ml    Last BM: PTA  Labs:   Recent Labs Lab 05/04/14 1618 05/05/14 0528  NA 132* 135*  K 3.9 4.0  CL 92* 98  CO2 24 25  BUN 20 23  CREATININE 0.72 0.61  CALCIUM 9.5 8.8  GLUCOSE 158* 152*    CBG (last 3)  No results found for this basename: GLUCAP,  in the last 72 hours  Scheduled Meds: . cycloSPORINE  1 drop Both Eyes BID  . donepezil  10 mg Oral Q supper  . hydrocortisone cream   Topical BID  . latanoprost  1 drop Both Eyes QHS  . methenamine  1 g Oral Q supper  . mirabegron ER  50 mg Oral Daily    Continuous Infusions: . sodium chloride 50 mL/hr at 05/04/14 1660    Past Medical History  Diagnosis Date  . Anemia   . Hyperlipidemia   . Hypertension   . Seizure disorder     poss from hponatremia when had pneumonia  . Recurrent UTI   . Urinary incontinence     mild  . Osteoporosis     Grade II spondylotistesis at l4-l5 testing in [place] 2008 and confirmed that  she has osteoporosis  . Spondylolisthesis of lumbar region     Grade II spondylotistesis at l4-l5 testing in [place] 2008   . Contusion of leg 08/06/2011  . Hematoma 08/06/2011    leg near shin no bov underlying bony abnormality and no infection. should take a while to resolve observe for infection   . Chest wall pain     "off and on" (05/26/2013)  . COPD (chronic obstructive pulmonary disease)   . Pleural effusion     "a few times" (05/26/2013)  . Seizures ~ 2009    "after pneumonia; electrolytes were all messed up" (05/26/2013)  . Pneumonia 09/2002; ~ 2009;   . Arthritis   . Osteoarthritis   . Thoracic compression fracture   . Thoracic back pain   . Skin cancer 2012    leg   . Dementia     Past Surgical History  Procedure Laterality Date  . Abdominal hysterectomy    . Lumbar disc surgery    .  Breast lumpectomy Bilateral      had fibrofatty tissue  . Cataract extraction w/ intraocular lens implant Left   . Glaucoma surgery Left   . Eye surgery    . Skin cancer excision  2012    Carlis Stable MS, [place], [place] [phone] Pager [phone] Weekend/After Hours Pager

## 2014-05-05 NOTE — Discharge Instructions (Signed)
Partial weight bearing left lower extremity She has staples for closure left hip, these will need to be removed on or after 05/18/14  Keep wound dry until follow up

## 2014-05-05 NOTE — Transfer of Care (Signed)
Immediate Anesthesia Transfer of Care Note  Patient: [name]  Procedure(s) Performed: Procedure(s) (LRB): INTRAMEDULLARY (IM) NAIL FEMORAL (Left)  Patient Location: PACU  Anesthesia Type: General  Level of Consciousness: sedated, patient cooperative and responds to stimulation  Airway & Oxygen Therapy: Patient Spontanous Breathing and Patient connected to face mask oxgen  Post-op Assessment: Report given to PACU RN and Post -op Vital signs reviewed and stable  Post vital signs: Reviewed and stable  Complications: No apparent anesthesia complications

## 2014-05-05 NOTE — Progress Notes (Signed)
Clinical Social Work Department CLINICAL SOCIAL WORK PLACEMENT NOTE 05/05/2014  Patient:  [name], [name]  Account Number:  [id number] Admit date:  05/04/2014  Clinical Social Worker:  Werner Lean, LCSW  Date/time:  05/05/2014 01:14 PM  Clinical Social Work is seeking post-discharge placement for this patient at the following level of care:   SKILLED NURSING   (*CSW will update this form in Epic as items are completed)   05/05/2014  Patient/family provided with [place] Department of Clinical Social Work's list of facilities offering this level of care within the geographic area requested by the patient (or if unable, by the patient's family).  05/05/2014  Patient/family informed of their freedom to choose among providers that offer the needed level of care, that participate in Medicare, Medicaid or managed care program needed by the patient, have an available bed and are willing to accept the patient.    Patient/family informed of [facility]' ownership interest in [facility], as well as of the fact that they are under no obligation to receive care at this facility.  PASARR submitted to EDS on 05/05/2014 PASARR number received on 05/05/2014  FL2 transmitted to all facilities in geographic area requested by pt/family on  05/05/2014 FL2 transmitted to all facilities within larger geographic area on   Patient informed that his/her managed care company has contracts with or will negotiate with  certain facilities, including the following:     Patient/family informed of bed offers received:   Patient chooses bed at  Physician recommends and patient chooses bed at    Patient to be transferred to  on   Patient to be transferred to facility by  Patient and family notified of transfer on  Name of family member notified:    The following physician request were entered in Epic:   Additional Comments:  Werner Lean LCSW [phone]

## 2014-05-05 NOTE — Progress Notes (Signed)
Clinical Social Work Department BRIEF PSYCHOSOCIAL ASSESSMENT 05/05/2014  Patient:  [name], [name]     Account Number:  [id number]     Admit date:  05/04/2014  Clinical Social Worker:  Lacie Scotts  Date/Time:  05/05/2014 01:32 PM  Referred by:  Physician  Date Referred:  05/05/2014 Referred for  SNF Placement   Other Referral:   Interview type:   Other interview type:    PSYCHOSOCIAL DATA Living Status:  FAMILY Admitted from facility:   Level of care:   Primary support name:  [name] Primary support relationship to patient:  CHILD, ADULT Degree of support available:   supportive    CURRENT CONCERNS Current Concerns  Post-Acute Placement   Other Concerns:    SOCIAL WORK ASSESSMENT / PLAN Pt is a 80 yr old female living at home with family prior to hospitalization. Pt has a femur fx and surgery is scheduled for today. ST rehab will be needed prior to returning home. CSW has met with pt's daughter and permission for SNF search has been provided. Pt has dementia is unable to participate in d/c planning. CSW will provide daughter with SNF bed offers as they are received.   Assessment/plan status:  Psychosocial Support/Ongoing Assessment of Needs Other assessment/ plan:   Information/referral to community resources:   Insurance coverage for SNF and ambulance transport reviewed.    PATIENT'S/FAMILY'S RESPONSE TO PLAN OF CARE: Pt's daughter is committed to having pt return home following ST Rehab. Daughter feels this will be the best plan for her mother.   Werner Lean LCSW [phone]

## 2014-05-05 NOTE — Anesthesia Preprocedure Evaluation (Addendum)
Anesthesia Evaluation  Patient identified by MRN, date of birth, ID band Patient awake    Reviewed: Allergy & Precautions, H&P , NPO status , Patient's Chart, lab work & pertinent test results  Airway Mallampati: II TM Distance: >3 FB Neck ROM: Limited    Dental no notable dental hx.    Pulmonary COPD breath sounds clear to auscultation  Pulmonary exam normal       Cardiovascular hypertension, Rhythm:Regular Rate:Normal  Left ventricle:  The cavity size was normal. There was moderate concentric hypertrophy. Systolic function was normal. The estimated ejection fraction was in the range of 60% to 65%. Images were inadequate for LV wall motion assessment. The study is not technically sufficient to allow evaluation of LV diastolic function.    Neuro/Psych negative neurological ROS  negative psych ROS   GI/Hepatic negative GI ROS, Neg liver ROS,   Endo/Other  negative endocrine ROS  Renal/GU negative Renal ROS  negative genitourinary   Musculoskeletal negative musculoskeletal ROS (+)   Abdominal   Peds negative pediatric ROS (+)  Hematology  (+) anemia ,   Anesthesia Other Findings   Reproductive/Obstetrics negative OB ROS                          Anesthesia Physical Anesthesia Plan  ASA: III  Anesthesia Plan: General   Post-op Pain Management:    Induction: Intravenous  Airway Management Planned: Oral ETT  Additional Equipment:   Intra-op Plan:   Post-operative Plan: Extubation in OR  Informed Consent: I have reviewed the patients History and Physical, chart, labs and discussed the procedure including the risks, benefits and alternatives for the proposed anesthesia with the patient or authorized representative who has indicated his/her understanding and acceptance.   Dental advisory given  Plan Discussed with: CRNA and Surgeon  Anesthesia Plan Comments:          Anesthesia Quick Evaluation

## 2014-05-05 NOTE — Progress Notes (Addendum)
[facility] PROGRESS NOTE  [name] VXY:[id number] DOB: September 07, 1918 DOA: 05/04/2014 PCP: [name], MD  Brief Summary  [name] is a 80 y.o. female has a past medical history significant for moderate to severe dementia, HLD, osteoporosis, urinary incontinence, COPD, presented with mechanical fall resulting in left femur fracture.  Dr. [name] consulted and anticipating surgery this evening.  I called this AM because no consult note had been placed and verified plan.    Assessment/Plan  Left comminuted femur fracture -  Possibly to OR later today for ORIF by Dr. [name], however, no consult note done and she is not on the OR schedule -  Paged Dr. [name] to try to clarify the plan  -  Low dose fentanyl and tylenol -  Non weight bearing prior to surgery -  DVT proph per surgery  Recurrent UTIs on suppressive therapy with Hiprex and myrbetriq -  Urinalysis currently without evidence of infection -  Minimize use of foley catheter  Chronic diastolic heart failure, euvolemic -  Lasix three times weekly -  Resume lasix post-operatively  Dermatitis, continue steroid cream   Osteoporosis - on outpatient tx   Dementia - continue home medications -  Minimize sedating medications  Dry eyes, stable, continue eye drops  Leukocytosis, likely reactive from fracture -  CXR and UA unremarkable  Normocytic anemia, may be hemodilutional or marrow suppression  -  Trend hgb and advised family she may need blood transfusion post-operatively  Hyponatremia, mild, sodium 135.    Diet:  NPO for surgery Access:  PIV IVF: off Proph:  SCD  Code Status: DNR Family Communication: spoke with patient and her daughter-in-law Disposition Plan: pending surgery, PT/OT post-operatively   Consultants:  Dr. [name]   Procedures:  Left ankle XR  Left hip XR  CXR  Antibiotics:  none   HPI/Subjective:  Demented.  No acute overnight events per RN and her caretaker spent the  night with no issues.  Has left thigh pain, and possibly some abdominal soreness.  Denies chest pain, shortness of breath.   Objective: Filed Vitals:   05/04/14 1830 05/04/14 2224 05/05/14 0201 05/05/14 0653  BP: 150/82 149/69  166/58  Pulse: 79 96  103  Temp: 97.6 F (36.4 C) 100.1 F (37.8 C) 98.3 F (36.8 C) 97.6 F (36.4 C)  TempSrc: Oral Axillary  Oral  Resp: 18 16  20   Height:    4\' 11"  (1.499 m)  Weight:    61.9 kg (136 lb 7.4 oz)  SpO2: 99% 100%  98%    Intake/Output Summary (Last 24 hours) at 05/05/14 0838 Last data filed at 05/05/14 0745  Gross per 24 hour  Intake 556.67 ml  Output    650 ml  Net -93.33 ml   Filed Weights   05/05/14 0653  Weight: 61.9 kg (136 lb 7.4 oz)    Exam:   General:  WF, No acute distress  HEENT:  NCAT, MMM  Cardiovascular:  RRR, nl S1, S2 no mrg, 2+ pulses, warm extremities  Respiratory:  CTAB, no increased WOB  Abdomen:   NABS, soft, ND, mild TTP without rebound or guarding  MSK:   Normal tone and bulk, no LEE.  No knee effusions, but very TTP along the mid lateral thigh and just below the left greater trochanter.  Left leg externally rotated and she hesitates to move it  Neuro:  Grossly  Moves all extremities  Psych:  Alert and oriented to person only  Data Reviewed:  Basic Metabolic Panel:  Recent Labs Lab 05/04/14 1618 05/05/14 0528  NA 132* 135*  K 3.9 4.0  CL 92* 98  CO2 24 25  GLUCOSE 158* 152*  BUN 20 23  CREATININE 0.72 0.61  CALCIUM 9.5 8.8   Liver Function Tests:  Recent Labs Lab 05/04/14 1618 05/05/14 0528  AST 17 17  ALT 10 10  ALKPHOS 81 66  BILITOT 0.3 0.3  PROT 7.9 6.8  ALBUMIN 3.7 3.1*   No results found for this basename: LIPASE, AMYLASE,  in the last 168 hours No results found for this basename: AMMONIA,  in the last 168 hours CBC:  Recent Labs Lab 05/04/14 1618 05/05/14 0528  WBC 13.3* 11.4*  NEUTROABS 11.8*  --   HGB 12.3 10.0*  HCT 36.7 29.2*  MCV 88.9 86.9  PLT 225 213    Cardiac Enzymes: No results found for this basename: CKTOTAL, CKMB, CKMBINDEX, TROPONINI,  in the last 168 hours BNP (last 3 results)  Recent Labs  05/27/13 0100 05/28/13 0602 02/02/14 0428  PROBNP 551.6* 338.4 3497.0*   CBG: No results found for this basename: GLUCAP,  in the last 168 hours  No results found for this or any previous visit (from the past 240 hour(s)).   Studies: Dg Chest 1 View  05/04/2014   CLINICAL DATA:  Preoperative films history of hypertension and COPD 8  EXAM: CHEST - 1 VIEW  COMPARISON:  02/02/2014  FINDINGS: Multilevel kyphoplasty noted. Heart size upper normal. Vascular pattern normal. Calcification and mild prominence of the aortic arch stable. Lungs clear. Mitral annulus calcification noted.  IMPRESSION: No acute findings   Electronically Signed   By: Skipper Cliche M.D.   On: 05/04/2014 16:48   Dg Hip Complete Left  05/04/2014   CLINICAL DATA:  Trauma and pain.  EXAM: LEFT HIP - COMPLETE 2+ VIEW  COMPARISON:  Pelvic MRI of 07/30/2013.  FINDINGS: Moderate osteopenia. Vascular calcifications. Femoral heads are located. Comminuted intertrochanteric left femur fracture. Mild varus angulation.  IMPRESSION: Comminuted left proximal femur fracture.   Electronically Signed   By: [name] M.D.   On: 05/04/2014 16:06   Dg Ankle Complete Left  05/04/2014   CLINICAL DATA:  Fall.  Left ankle pain.  EXAM: LEFT ANKLE COMPLETE - 3+ VIEW  COMPARISON:  None.  FINDINGS: Bony demineralization. Having material obscures the lateral ankle and lateral part of the proximal foot. Reportedly these were the best obtainable images, but sensitivity for injury is reduced.  Atherosclerosis noted. The lower calcaneus and cuboid are excluded on the lateral projection. No well-defined ankle fracture.  IMPRESSION: 1. Reduced sensitivity due to artifact and difficulty with positioning. No fracture observed. 2. Bony demineralization.   Electronically Signed   By: [name] M.D.   On:  05/04/2014 16:10    Scheduled Meds: . cycloSPORINE  1 drop Both Eyes BID  . donepezil  10 mg Oral Q supper  . hydrocortisone cream   Topical BID  . latanoprost  1 drop Both Eyes QHS  . methenamine  1 g Oral Q supper  . mirabegron ER  50 mg Oral Daily   Continuous Infusions: . sodium chloride 50 mL/hr at 05/04/14 5277    Active Problems:   HYPERLIPIDEMIA   DEMENTIA, MILD   HYPERTENSION   Intertrochanteric fracture of left femur   Femur fracture    Time spent: 30 min    Aariv Medlock, [facility]  [facility] Pager [phone]. If 7PM-7AM, please contact night-coverage at [email or web address], password  [email or web address] 05/05/2014, 8:38 AM  LOS: 1 day

## 2014-05-05 NOTE — Anesthesia Postprocedure Evaluation (Signed)
  Anesthesia Post-op Note  Patient: [name]  Procedure(s) Performed: Procedure(s) (LRB): INTRAMEDULLARY (IM) NAIL FEMORAL (Left)  Patient Location: PACU  Anesthesia Type: General  Level of Consciousness: awake and alert   Airway and Oxygen Therapy: Patient Spontanous Breathing  Post-op Pain: mild  Post-op Assessment: Post-op Vital signs reviewed, Patient's Cardiovascular Status Stable, Respiratory Function Stable, Patent Airway and No signs of Nausea or vomiting  Last Vitals:  Filed Vitals:   05/05/14 1442  BP: 165/71  Pulse: 103  Temp: 37.4 C  Resp: 18    Post-op Vital Signs: stable   Complications: No apparent anesthesia complications

## 2014-05-05 NOTE — Brief Op Note (Signed)
05/04/2014 - 05/05/2014  6:06 PM  PATIENT:  Barrie Lyme  80 y.o. female  PRE-OPERATIVE DIAGNOSIS:  Left inter-trochanteric fracture  POST-OPERATIVE DIAGNOSIS:  Left inter-trochanteric fracture  PROCEDURE:  Procedure(s): INTRAMEDULLARY (IM) NAIL FEMORAL (Left), ORIF left hip fracture  SURGEON:  Surgeon(s) and Role:    * [name], MD - Primary  PHYSICIAN ASSISTANT: None  ANESTHESIA:   general  EBL:  Total I/O In: 400 [I.V.:400] Out: 300 [Urine:300]  BLOOD ADMINISTERED:none  DRAINS: none   LOCAL MEDICATIONS USED:  NONE  SPECIMEN:  No Specimen  DISPOSITION OF SPECIMEN:  N/A  COUNTS:  YES  TOURNIQUET:  * No tourniquets in log *  DICTATION: .Other Dictation: Dictation Number [id number]  PLAN OF CARE: Admit to inpatient   PATIENT DISPOSITION:  PACU - hemodynamically stable.   Delay start of Pharmacological VTE agent (>24hrs) due to surgical blood loss or risk of bleeding: no

## 2014-05-06 ENCOUNTER — Encounter (HOSPITAL_COMMUNITY): Payer: Self-pay | Admitting: Orthopedic Surgery

## 2014-05-06 DIAGNOSIS — IMO0002 Reserved for concepts with insufficient information to code with codable children: Secondary | ICD-10-CM

## 2014-05-06 DIAGNOSIS — H04129 Dry eye syndrome of unspecified lacrimal gland: Secondary | ICD-10-CM

## 2014-05-06 DIAGNOSIS — D72829 Elevated white blood cell count, unspecified: Secondary | ICD-10-CM

## 2014-05-06 DIAGNOSIS — S72142A Displaced intertrochanteric fracture of left femur, initial encounter for closed fracture: Secondary | ICD-10-CM

## 2014-05-06 LAB — URINALYSIS, ROUTINE W REFLEX MICROSCOPIC
BILIRUBIN URINE: NEGATIVE
Glucose, UA: NEGATIVE mg/dL
Ketones, ur: NEGATIVE mg/dL
NITRITE: NEGATIVE
PH: 5.5 (ref 5.0–8.0)
Protein, ur: 30 mg/dL — AB
Specific Gravity, Urine: 1.026 (ref 1.005–1.030)
UROBILINOGEN UA: 0.2 mg/dL (ref 0.0–1.0)

## 2014-05-06 LAB — CBC
HCT: 25.1 % — ABNORMAL LOW (ref 36.0–46.0)
Hemoglobin: 8.4 g/dL — ABNORMAL LOW (ref 12.0–15.0)
MCH: 30.1 pg (ref 26.0–34.0)
MCHC: 33.5 g/dL (ref 30.0–36.0)
MCV: 90 fL (ref 78.0–100.0)
Platelets: 158 10*3/uL (ref 150–400)
RBC: 2.79 MIL/uL — AB (ref 3.87–5.11)
RDW: 12.9 % (ref 11.5–15.5)
WBC: 12.5 10*3/uL — AB (ref 4.0–10.5)

## 2014-05-06 LAB — BASIC METABOLIC PANEL
ANION GAP: 12 (ref 5–15)
BUN: 18 mg/dL (ref 6–23)
CALCIUM: 8 mg/dL — AB (ref 8.4–10.5)
CO2: 24 mEq/L (ref 19–32)
CREATININE: 0.64 mg/dL (ref 0.50–1.10)
Chloride: 103 mEq/L (ref 96–112)
GFR calc Af Amer: 86 mL/min — ABNORMAL LOW (ref >90)
GFR calc non Af Amer: 75 mL/min — ABNORMAL LOW (ref >90)
GLUCOSE: 168 mg/dL — AB (ref 70–99)
Potassium: 3.8 mEq/L (ref 3.7–5.3)
SODIUM: 139 meq/L (ref 137–147)

## 2014-05-06 LAB — URINE MICROSCOPIC-ADD ON

## 2014-05-06 MED ORDER — TRAMADOL-ACETAMINOPHEN 37.5-325 MG PO TABS: 1.0000 | ORAL_TABLET | Freq: Four times a day (QID) | ORAL | Status: AC | PRN

## 2014-05-06 MED ORDER — MORPHINE SULFATE 4 MG/ML IJ SOLN
4.0000 mg | INTRAMUSCULAR | Status: DC | PRN
  Administered 2014-05-07: 2 mg via INTRAVENOUS
  Filled 2014-05-06: qty 1

## 2014-05-06 MED ORDER — ENOXAPARIN SODIUM 40 MG/0.4ML ~~LOC~~ SOLN: 40.0000 mg | SUBCUTANEOUS | Status: DC

## 2014-05-06 MED ORDER — CEFAZOLIN SODIUM 1-5 GM-% IV SOLN
1.0000 g | Freq: Four times a day (QID) | INTRAVENOUS | Status: AC
  Administered 2014-05-06 (×2): 1 g via INTRAVENOUS
  Filled 2014-05-06 (×3): qty 50

## 2014-05-06 MED ORDER — DEXTROSE 5 % IV SOLN
500.0000 mg | Freq: Four times a day (QID) | INTRAVENOUS | Status: DC | PRN
  Administered 2014-05-06 – 2014-05-10 (×2): 500 mg via INTRAVENOUS
  Filled 2014-05-06 (×3): qty 5

## 2014-05-06 MED ORDER — CEFAZOLIN (ANCEF) 1 G IV SOLR: 1.0000 g | INTRAVENOUS | Status: DC

## 2014-05-06 MED ORDER — MORPHINE SULFATE 4 MG/ML IJ SOLN
4.0000 mg | Freq: Once | INTRAMUSCULAR | Status: AC
  Administered 2014-05-06: 4 mg via INTRAVENOUS
  Filled 2014-05-06: qty 1

## 2014-05-06 NOTE — Progress Notes (Signed)
     Subjective: 1 Day Post-Op Procedure(s) (LRB): INTRAMEDULLARY (IM) NAIL FEMORAL (Left)   Patient reports pain as mild, pain appears to be controlled. Reported that there was some confusion last night.  Objective:   VITALS:   Filed Vitals:   05/06/14 0515  BP: 134/57  Pulse: 124  Temp: 97.8 F (36.6 C)  Resp: 22    Dorsiflexion/Plantar flexion intact Incision: dressing C/D/I No cellulitis present Compartment soft  LABS  Recent Labs  05/04/14 1618 05/05/14 0528 05/06/14 0507  HGB 12.3 10.0* 8.4*  HCT 36.7 29.2* 25.1*  WBC 13.3* 11.4* 12.5*  PLT 225 213 158     Recent Labs  05/04/14 1618 05/05/14 0528 05/06/14 0507  NA 132* 135* 139  K 3.9 4.0 3.8  BUN 20 23 18   CREATININE 0.72 0.61 0.64  GLUCOSE 158* 152* 168*     Assessment/Plan: 1 Day Post-Op Procedure(s) (LRB): INTRAMEDULLARY (IM) NAIL FEMORAL (Left) Up with therapy Discharge to SNF eventually, when medically ready  Ortho recommendations:  Lovenox 40 mg qd for 12 days for anticoagulation, unless other medically indicated.  Ultracet for pain management, Rx printed.  MiraLax and Colace for constipation  Iron 325 mg tid for 2-3 weeks   WB 50% on the left leg.  Follow up in 2 weeks at [facility]. Follow up with OLIN,Jennifermarie Franzen D in 2 weeks.  Contact information:  [facility] [address] [place] Bernie Dayanna Pryce   PAC  05/06/2014, 9:23 AM

## 2014-05-06 NOTE — Progress Notes (Signed)
Physical Therapy Treatment Patient Details Name: [name] MRN: [id number] DOB: 04-08-1919 Today's Date: 27-May-2014    History of Present Illness s/p IM nail L hip; PMHx: dementia, HTN, compression fxs,  COPD    PT Comments    Pt cooperative, sleepy but easily arousable this pm, continues to require +2 assist; Spoke with pt dtr regarding D/C plan (dtr is a PT),  Still considering options--SNf vs home/HHPT; Will continue to follow;   Follow Up Recommendations  SNF     Equipment Recommendations  None recommended by PT    Recommendations for Other Services       Precautions / Restrictions Precautions Precautions: Fall Restrictions LLE Weight Bearing: Partial weight bearing    Mobility  Bed Mobility Overal bed mobility: Needs Assistance;+2 for physical assistance Bed Mobility: Sit to Supine       Sit to supine: +2 for safety/equipment;Max assist   General bed mobility comments: +2 for trunk and LEs  Transfers Overall transfer level: Needs assistance   Transfers: Sit to/from Stand;Stand Pivot Transfers Sit to Stand: +2 physical assistance;Total assist Stand pivot transfers: Total assist;+2 physical assistance       General transfer comment:  pt required bil LE  support at knees to prevent buckling, +2 for wt shift, transfer  Ambulation/Gait                 Stairs            Wheelchair Mobility    Modified Rankin (Stroke Patients Only)       Balance Overall balance assessment: Needs assistance Sitting-balance support: Feet supported;No upper extremity supported Sitting balance-Leahy Scale: Poor       Standing balance-Leahy Scale: Zero                      Cognition Arousal/Alertness: Awake/alert Behavior During Therapy: WFL for tasks assessed/performed Overall Cognitive Status: History of cognitive impairments - at baseline                      Exercises      General Comments        Pertinent Vitals/Pain  Pain Assessment: Faces Faces Pain Scale: Hurts little more Pain Location: left hip Pain Intervention(s): Limited activity within patient's tolerance;Monitored during session;Repositioned    Home Living                      Prior Function            PT Goals (current goals can now be found in the care plan section) Acute Rehab PT Goals Patient Stated Goal: does not state/unable PT Goal Formulation: Patient unable to participate in goal setting Time For Goal Achievement: 05/13/14 Potential to Achieve Goals: Good Progress towards PT goals: Progressing toward goals    Frequency  Min 3X/week    PT Plan Current plan remains appropriate    Co-evaluation             End of Session Equipment Utilized During Treatment: Gait belt Activity Tolerance: Patient tolerated treatment well Patient left: in bed;with call bell/phone within reach;with family/visitor present     Time: 1420-1443 PT Time Calculation (min): 23 min  Charges:  $Therapeutic Activity: 8-22 mins                    G Codes:      Benn Tarver 05/27/14, 3:25 PM

## 2014-05-06 NOTE — Progress Notes (Signed)
CSW assisting with d/c planning. Pt's daughter will tour SNF's today and contact CSW with rehab choice.   Werner Lean LCSW [phone]

## 2014-05-06 NOTE — Evaluation (Signed)
Physical Therapy Evaluation Patient Details Name: [name] MRN: [id number] DOB: 07/21/1919 Today's Date: 05/06/2014   History of Present Illness  s/p IM nail L hip; PMHx: dementia, HTN, compression fxs,  COPD  Clinical Impression  Pt will benefit from PT to address deficits below; will need SNF, pt caregiver states this is the plan per discussion with family.    Follow Up Recommendations SNF    Equipment Recommendations  None recommended by PT    Recommendations for Other Services       Precautions / Restrictions Precautions Precautions: Fall Restrictions Weight Bearing Restrictions: Yes LLE Weight Bearing: Partial weight bearing      Mobility  Bed Mobility Overal bed mobility: Needs Assistance;+2 for physical assistance Bed Mobility: Supine to Sit     Supine to sit: Total assist     General bed mobility comments: +2 for trunk and LEs  Transfers Overall transfer level: Needs assistance Equipment used: Rolling walker (2 wheeled) Transfers: Sit to/from [facility] Sit to Stand: +2 physical assistance;Total assist Stand pivot transfers: Total assist;+2 physical assistance       General transfer comment: attempted to stand with RW, pt unable to WB through RW; pt required bil LE  support at knees to prevent buckling, +2 for wt shift, transfer  Ambulation/Gait                Stairs            Wheelchair Mobility    Modified Rankin (Stroke Patients Only)       Balance Overall balance assessment: Needs assistance;History of Falls Sitting-balance support: Feet supported;No upper extremity supported Sitting balance-Leahy Scale: Fair     Standing balance support: Bilateral upper extremity supported Standing balance-Leahy Scale: Zero                               Pertinent Vitals/Pain Pain Assessment: Faces Faces Pain Scale: Hurts whole lot Pain Intervention(s): Limited activity within patient's  tolerance;Monitored during session;Premedicated before session    Home Living Family/patient expects to be discharged to:: Skilled nursing facility Living Arrangements: Children;Non-relatives/Friends               Additional Comments: caregivers 24/7    Prior Function Level of Independence: Needs assistance   Gait / Transfers Assistance Needed: supervision for gait and transfers wit hRW  ADL's / Homemaking Assistance Needed: assist for all homemaking, bathing, supervision for shower transfers        Hand Dominance        Extremity/Trunk Assessment   Upper Extremity Assessment: Generalized weakness           Lower Extremity Assessment: LLE deficits/detail      Cervical / Trunk Assessment: Kyphotic  Communication   Communication: No difficulties  Cognition Arousal/Alertness: Awake/alert Behavior During Therapy: Restless Overall Cognitive Status: History of cognitive impairments - at baseline                      General Comments      Exercises        Assessment/Plan    PT Assessment Patient needs continued PT services  PT Diagnosis Difficulty walking   PT Problem List Decreased strength;Decreased activity tolerance;Decreased balance;Decreased mobility;Decreased knowledge of use of DME  PT Treatment Interventions DME instruction;Gait training;Functional mobility training;Therapeutic activities;Therapeutic exercise;Patient/family education   PT Goals (Current goals can be found in the Care Plan section) Acute Rehab PT  Goals Patient Stated Goal: does not state PT Goal Formulation: Patient unable to participate in goal setting Time For Goal Achievement: 05/13/14 Potential to Achieve Goals: Good    Frequency Min 3X/week   Barriers to discharge        Co-evaluation               End of Session Equipment Utilized During Treatment: Gait belt Activity Tolerance: Patient tolerated treatment well Patient left: in chair;with call  bell/phone within reach;with nursing/sitter in room Nurse Communication: Mobility status         Time: 1020-1044 PT Time Calculation (min): 24 min   Charges:   PT Evaluation $Initial PT Evaluation Tier I: 1 Procedure PT Treatments $Therapeutic Activity: 23-37 mins   PT G Codes:          [name] 05-30-14, 11:19 AM

## 2014-05-06 NOTE — Care Management Note (Addendum)
    Page 1 of 1   05/12/2014     12:14:57 PM CARE MANAGEMENT NOTE 05/12/2014  Patient:  [name], [name]   Account Number:  [id number]  Date Initiated:  05/06/2014  Documentation initiated by:  [facility]  Subjective/Objective Assessment:   adm: leg pain/INTRAMEDULLARY (IM) NAIL FEMORAL (Left)     Action/Plan:   SNF for rehab   Anticipated DC Date:  05/12/2014   Anticipated DC Plan:  [place]  CM consult      Choice offered to / List presented to:             Status of service:  Completed, signed off Medicare Important Message given?  YES (If response is "NO", the following Medicare IM given date fields will be blank) Date Medicare IM given:  05/06/2014 Medicare IM given by:  [facility] Date Additional Medicare IM given:  05/11/2014 Additional Medicare IM given by:  [facility]  Discharge Disposition:  [place]  Per UR Regulation:  Reviewed for med. necessity/level of care/duration of stay  If discussed at [place] of Stay Meetings, dates discussed:   05/12/2014    Comments:  05/12/14 Shatana Saxton RN,BSN NCM [phone] D/C SNF.  05/11/14 Marwah Disbro RN,BSN NCM [phone] POD#6 IM NAIL L FEMUR FX.STROKE W/U-MBS.D/C SNF.  05/06/14 13:55 CM notes recc. is for SNF for rehab; CSW arranging for SNF placement.  No other CM needs were communicated.  Mariane Masters, BSN, CM [phone].

## 2014-05-06 NOTE — Progress Notes (Signed)
OT Cancellation Note  Patient Details Name: [name] MRN: [id number] DOB: 1919/08/04   Cancelled Treatment:    Reason Eval/Treat Not Completed: Other (comment)  Pt is Medicare/Medicaid and current D/C plan is SNF. No apparent immediate acute care OT needs, therefore will defer OT to SNF. If OT eval is needed please call Acute Rehab Dept. at [place] 05/06/2014, 8:08 AM Lesle Chris, OTR/L [phone] 05/06/2014

## 2014-05-06 NOTE — Progress Notes (Signed)
PROGRESS NOTE  [name] OTR:[id number] DOB: Jun 26, 1919 DOA: 05/04/2014 PCP: [name], MD  Assessment/Plan: Left comminuted femur fracture  - ORIF by Dr. [name] 05/05/14 - IV morphine and tramadol for pain -has only required 2 doses tramadol 05/06/14 - PT-->recommends SNF - DVT proph per surgery  Recurrent UTIs on suppressive therapy with Hiprex and myrbetriq  - Urinalysis currently without evidence of infection  - Minimize use of foley catheter-->d/c foley today Tachycardia -likely reactive from pain and stress -no sob or chest pain -recheck UA prior to d/c foley Chronic diastolic heart failure, euvolemic  - Lasix three times weekly  - Resume lasix post-operatively  Dermatitis,  -continue steroid cream  Osteoporosis - on outpatient tx  Dementia  -- continue home medications  - Minimize sedating medications  Dry eyes,  -stable, continue eye drops  -continue systane Leukocytosis, likely reactive from fracture  - CXR and UA unremarkable  Normocytic anemia,  -may be hemodilutional or marrow suppression  - Trend hgb and advised family she may need blood transfusion post-operatively  Hyponatremia,  -mild, sodium 135. -improved with IVF   Access: PIV  IVF: off  Proph: SCD  Code Status: DNR  Family Communication: spoke with patient and her daughter-in-law at bedside Disposition Plan: SNF on 9/4 if stable Consultants:  Dr. [name]  Procedures:  Left ankle XR  Left hip XR  CXR Antibiotics:  none          Procedures/Studies: Dg Chest 1 View  05/04/2014   CLINICAL DATA:  Preoperative films history of hypertension and COPD 8  EXAM: CHEST - 1 VIEW  COMPARISON:  02/02/2014  FINDINGS: Multilevel kyphoplasty noted. Heart size upper normal. Vascular pattern normal. Calcification and mild prominence of the aortic arch stable. Lungs clear. Mitral annulus calcification noted.  IMPRESSION: No acute findings   Electronically Signed   By: Skipper Cliche M.D.    On: 05/04/2014 16:48   Dg Hip Complete Left  05/04/2014   CLINICAL DATA:  Trauma and pain.  EXAM: LEFT HIP - COMPLETE 2+ VIEW  COMPARISON:  Pelvic MRI of 07/30/2013.  FINDINGS: Moderate osteopenia. Vascular calcifications. Femoral heads are located. Comminuted intertrochanteric left femur fracture. Mild varus angulation.  IMPRESSION: Comminuted left proximal femur fracture.   Electronically Signed   By: [name] M.D.   On: 05/04/2014 16:06   Dg Hip Operative Left  05/05/2014   CLINICAL DATA:  ORIF LEFT hip  EXAM: OPERATIVE LEFT HIP  COMPARISON:  Three digital C-arm fluoroscopic images obtained intraoperatively are compared to LEFT hip radiographs of 05/04/2014  FINDINGS: IM nail with compression screw across reduced intertrochanteric fracture of LEFT femur.  Bones appear demineralized.  Distal locking screw present.  No additional fracture or dislocation seen.  IMPRESSION: Post ORIF of intertrochanteric fracture LEFT femur.   Electronically Signed   By: [name] M.D.   On: 05/05/2014 18:57   Dg Ankle Complete Left  05/04/2014   CLINICAL DATA:  Fall.  Left ankle pain.  EXAM: LEFT ANKLE COMPLETE - 3+ VIEW  COMPARISON:  None.  FINDINGS: Bony demineralization. Having material obscures the lateral ankle and lateral part of the proximal foot. Reportedly these were the best obtainable images, but sensitivity for injury is reduced.  Atherosclerosis noted. The lower calcaneus and cuboid are excluded on the lateral projection. No well-defined ankle fracture.  IMPRESSION: 1. Reduced sensitivity due to artifact and difficulty with positioning. No fracture observed. 2. Bony demineralization.  Electronically Signed   By: [name] M.D.   On: 05/04/2014 16:10         Subjective: Patient denies fevers, chills, chest pain, shortness breath, vomiting, abdominal pain. She complains of some pain in the left hip  Objective: Filed Vitals:   05/06/14 0800 05/06/14 1015 05/06/14 1200 05/06/14 1328  BP:   140/65  109/57  Pulse:  116  121  Temp:  98.9 F (37.2 C)  98.1 F (36.7 C)  TempSrc:  Oral  Oral  Resp: 22 18 18 18   Height:      Weight:      SpO2: 99% 100% 100% 100%    Intake/Output Summary (Last 24 hours) at 05/06/14 1852 Last data filed at 05/06/14 1400  Gross per 24 hour  Intake 2429.99 ml  Output    650 ml  Net 1779.99 ml   Weight change:  Exam:   General:  Pt is alert, follows commands appropriately, not in acute distress  HEENT: No icterus, No thrush,  [place]/AT  Cardiovascular: RRR, S1/S2, no rubs, no gallops  Respiratory: Bibasilar crackles. No wheezing. Good air movement.  Abdomen: Soft/+BS, non tender, non distended, no guarding  Extremities: No edema, No lymphangitis, No petechiae, No rashes, no synovitis  Data Reviewed: Basic Metabolic Panel:  Recent Labs Lab 05/04/14 1618 05/05/14 0528 05/06/14 0507  NA 132* 135* 139  K 3.9 4.0 3.8  CL 92* 98 103  CO2 24 25 24   GLUCOSE 158* 152* 168*  BUN 20 23 18   CREATININE 0.72 0.61 0.64  CALCIUM 9.5 8.8 8.0*   Liver Function Tests:  Recent Labs Lab 05/04/14 1618 05/05/14 0528  AST 17 17  ALT 10 10  ALKPHOS 81 66  BILITOT 0.3 0.3  PROT 7.9 6.8  ALBUMIN 3.7 3.1*   No results found for this basename: LIPASE, AMYLASE,  in the last 168 hours No results found for this basename: AMMONIA,  in the last 168 hours CBC:  Recent Labs Lab 05/04/14 1618 05/05/14 0528 05/06/14 0507  WBC 13.3* 11.4* 12.5*  NEUTROABS 11.8*  --   --   HGB 12.3 10.0* 8.4*  HCT 36.7 29.2* 25.1*  MCV 88.9 86.9 90.0  PLT 225 213 158   Cardiac Enzymes: No results found for this basename: CKTOTAL, CKMB, CKMBINDEX, TROPONINI,  in the last 168 hours BNP: No components found with this basename: POCBNP,  CBG: No results found for this basename: GLUCAP,  in the last 168 hours  Recent Results (from the past 240 hour(s))  MRSA PCR SCREENING     Status: None   Collection Time    05/05/14  4:15 PM      Result Value Ref Range  Status   MRSA by PCR NEGATIVE  NEGATIVE Final   Comment:            The GeneXpert MRSA Assay (FDA     approved for NASAL specimens     only), is one component of a     comprehensive MRSA colonization     surveillance program. It is not     intended to diagnose MRSA     infection nor to guide or     monitor treatment for     MRSA infections.     Scheduled Meds: . cycloSPORINE  1 drop Both Eyes BID  . docusate sodium  100 mg Oral Daily  . donepezil  10 mg Oral Q supper  . enoxaparin (LOVENOX) injection  40 mg Subcutaneous Q24H  .  hydrocortisone cream   Topical BID  . latanoprost  1 drop Both Eyes QHS  . methenamine  1 g Oral Q supper  . mirabegron ER  50 mg Oral Daily   Continuous Infusions: . sodium chloride Stopped (05/06/14 0510)  . sodium chloride 0.9 % 1,000 mL with potassium chloride 10 mEq infusion 50 mL/hr at 05/06/14 1305     Heath Badon, DO  [facility] Pager [phone]  If 7PM-7AM, please contact night-coverage [email or web address] Password [email or web address] 05/06/2014, 6:52 PM   LOS: 2 days

## 2014-05-06 NOTE — Op Note (Signed)
NAMEMASHANDA, [name] NO.:  [id number]  MEDICAL RECORD NO.:  [id number]  LOCATION:  3710                         FACILITY:  [facility]  PHYSICIAN:  Pietro Cassis. [name], M.D.  DATE OF BIRTH:  [birth year]  DATE OF PROCEDURE:  05/04/2014 DATE OF DISCHARGE:                              OPERATIVE REPORT   PREOPERATIVE DIAGNOSIS:  Left comminuted proximal femur fracture, peritrochanteric comminution.  POSTOPERATIVE DIAGNOSIS:  Left comminuted proximal femur fracture, peritrochanteric comminution.  PROCEDURE:  Left open reduction and internal fixation of a left peritrochanteric comminuted proximal femur fracture utilizing intramedullary nail, specifically a Biomet AFFIXUS nail 11 x 180 mm, 125 degree lag screw, and a distal interlock.  SURGEON:  Pietro Cassis. [name], M.D.  ASSISTANT:  Surgical team.  ANESTHESIA:  General.  SPECIMENS:  None.  COMPLICATIONS:  None.  BLOOD LOSS:  Probably about 50 mL.  INDICATIONS FOR PROCEDURE:  [name] is a 80 year old female with multiple medical issues including dementia as well as COPD.  She had presented to the emergency room after fall at home.  She currently resides with her family at home and with a 24-hour caregiver. Unfortunately, she was left unattended for just a little bit, may have slid out of her chair, landing on to her left hip.  No other radiographic findings at the time of her admission.  Per fracture pathway protocol, she is admitted to the Medical Service and Orthopedics is consulted.  [name] family has been cared for through [facility] and was subsequently counseled to help manage.  I reviewed with her daughter the risks, benefits, and necessity of the procedure and they wished to proceed.  Consent was obtained after reviewing risks of infection, DVT, nonunion, and need for future surgery.  PROCEDURE IN DETAIL:  The patient was brought to the operative theater. Once adequate anesthesia,  preoperative antibiotics, Ancef administered, she was positioned supine on the fracture table.  Right leg was flexed and abducted out of the way with bony prominences padded.  The left lower extremity was placed in a traction boot.  The traction/internal rotation was first applied.  The fluoroscopy brought into the field. Under fluoroscopic evaluation, I found that with external rotation, the fracture reduced to a near-anatomic position.  Given the comminution of this, I was very satisfied with this reduction.  At this point, the left lower extremity was prepped and draped in a sterile fashion with shower curtain technique.  A time-out was performed identifying the patient, planned procedure, and extremity.  At this point, fluoroscopy was brought back to the field and landmarks were identified.  An incision was made proximal to the tip of the trochanter.  The guidewire was inserted into the tip of the trochanter and then passed across the fracture site into the proximal femur.  I then opened up the proximal femur with the opening drill and then tried to pass the 11 mm nail.  I found that I was unable to get it past the proximal portion of the femur and thus removed the nail to prevent complications and placed the ball-tip guidewire down to the distal end of the femur and then reamed from 10 to  11 to 12 reamer and then passed the 11 mm nail by hand.  At this point, the nail passed easily and it was at its appropriate depth which I kept a little bit more prominent on purpose due to the comminution of the lateral cortex of the femur.  With it in its appropriate place, I then using the insertion guide placed the guidewire into the center of the head laterally but slightly superior on the AP view.  Again, I accepted this position based on the attempts at trying to provide fixation of the lateral cortex and the comminution.  Once I drilled and selected 95 mm lag screw, it was passed.  I then  tightened the locking screw down and backed off just a quarter of a turn.  A single distal interlock was placed distally.  At this point, the jig was removed off of the nail.  The wounds were irrigated with normal saline solution.  Final radiographs were obtained in the AP and lateral planes.  The proximal wound was closed in layers using #1 Vicryl in the gluteal fascia, 2-0 Vicryl and staples on the skin.  The remaining wounds were closed with 2-0 Vicryl and staples on the skin.  The skin was cleaned, dried, and dressed sterilely using a long Mepilex dressing.  She was then brought to the recovery room, extubated in stable condition, tolerating the procedure well.  Findings were reviewed with the family. Plan will be for her to be partial weightbearing, transfers from bed to chair for 2-3 months until fracture healed nicely to prevent complications from her overall activity.     Pietro Cassis [name], M.D.     MDO/MEDQ  D:  05/05/2014  T:  05/06/2014  Job:  [id number]

## 2014-05-07 DIAGNOSIS — D62 Acute posthemorrhagic anemia: Secondary | ICD-10-CM

## 2014-05-07 LAB — CBC
HCT: 23.4 % — ABNORMAL LOW (ref 36.0–46.0)
HEMOGLOBIN: 7.6 g/dL — AB (ref 12.0–15.0)
MCH: 29.7 pg (ref 26.0–34.0)
MCHC: 32.5 g/dL (ref 30.0–36.0)
MCV: 91.4 fL (ref 78.0–100.0)
Platelets: 170 10*3/uL (ref 150–400)
RBC: 2.56 MIL/uL — ABNORMAL LOW (ref 3.87–5.11)
RDW: 12.9 % (ref 11.5–15.5)
WBC: 9.9 10*3/uL (ref 4.0–10.5)

## 2014-05-07 LAB — SAMPLE TO BLOOD BANK

## 2014-05-07 LAB — BASIC METABOLIC PANEL
Anion gap: 10 (ref 5–15)
BUN: 23 mg/dL (ref 6–23)
CO2: 25 mEq/L (ref 19–32)
CREATININE: 0.66 mg/dL (ref 0.50–1.10)
Calcium: 8.2 mg/dL — ABNORMAL LOW (ref 8.4–10.5)
Chloride: 102 mEq/L (ref 96–112)
GFR calc Af Amer: 86 mL/min — ABNORMAL LOW (ref >90)
GFR, EST NON AFRICAN AMERICAN: 74 mL/min — AB (ref >90)
Glucose, Bld: 127 mg/dL — ABNORMAL HIGH (ref 70–99)
Potassium: 3.9 mEq/L (ref 3.7–5.3)
Sodium: 137 mEq/L (ref 137–147)

## 2014-05-07 LAB — TSH: TSH: 3.23 u[IU]/mL (ref 0.350–4.500)

## 2014-05-07 LAB — ABO/RH: ABO/RH(D): A POS

## 2014-05-07 LAB — PREPARE RBC (CROSSMATCH)

## 2014-05-07 MED ORDER — POLYETHYL GLYCOL-PROPYL GLYCOL 0.4-0.3 % OP SOLN: 1.0000 [drp] | OPHTHALMIC | Status: AC

## 2014-05-07 MED ORDER — ACETAMINOPHEN 325 MG PO TABS: 650.0000 mg | ORAL_TABLET | Freq: Four times a day (QID) | ORAL | Status: AC | PRN

## 2014-05-07 MED ORDER — SODIUM CHLORIDE 0.9 % IV SOLN: Freq: Once | INTRAVENOUS | Status: AC

## 2014-05-07 NOTE — Progress Notes (Signed)
CSW assisting with d/c planning. Pt's daughter has chosen [facility] for [employer]. Bed will be available tomorrow if pt is ready for d/c.  Werner Lean LCSW [phone]

## 2014-05-07 NOTE — Progress Notes (Signed)
PROGRESS NOTE  [name] RSW:[id number] DOB: [birth year] DOA: 05/04/2014 PCP: [name], MD  Assessment/Plan: Left comminuted femur fracture  - ORIF by Dr. [name] 05/05/14  - d/c IV morphine as pt more somnolent after dose today -use tramadol and norco for pain - only required 2 doses tramadol 05/06/14  - PT-->recommends SNF  - DVT proph per surgery  Acute Blood Loss Anemia -partly dilutional -Transfuse 2 units PRBC Recurrent UTIs on suppressive therapy with Hiprex and myrbetriq  - Urinalysis currently without evidence of infection  - Minimize use of foley catheter-->d/c foley 05/06/14 -UA without pyuria Tachycardia  -likely reactive from pain, stress, and anemia  -no sob or chest pain  -TSH 3.230 -transfuse 2 units PRBC Chronic diastolic heart failure, euvolemic  - Lasix three times weekly on hold - Resume lasix after d/c Dermatitis,  -continue steroid cream  Osteoporosis - on outpatient tx  Dementia  -- continue home medications  - Minimize sedating medications  Dry eyes,  -stable, continue eye drops  -continue systane  Leukocytosis, likely reactive from fracture  -stress demargination -improved - CXR and UA unremarkable   Hyponatremia,  -mild, sodium 135.  -improved with IVF  Access: PIV  IVF: off  Proph: SCD  Code Status: DNR  Family Communication: spoke with patient and her daughter-in-law on phone Disposition Plan: SNF on 9/4 if stable  Consultants:  Dr. [name]  Procedures:  Left ankle XR  Left hip XR  CXR Antibiotics:  none           Procedures/Studies: Dg Chest 1 View  05/04/2014   CLINICAL DATA:  Preoperative films history of hypertension and COPD 8  EXAM: CHEST - 1 VIEW  COMPARISON:  02/02/2014  FINDINGS: Multilevel kyphoplasty noted. Heart size upper normal. Vascular pattern normal. Calcification and mild prominence of the aortic arch stable. Lungs clear. Mitral annulus calcification noted.  IMPRESSION: No acute findings    Electronically Signed   By: Skipper Cliche M.D.   On: 05/04/2014 16:48   Dg Hip Complete Left  05/04/2014   CLINICAL DATA:  Trauma and pain.  EXAM: LEFT HIP - COMPLETE 2+ VIEW  COMPARISON:  Pelvic MRI of 07/30/2013.  FINDINGS: Moderate osteopenia. Vascular calcifications. Femoral heads are located. Comminuted intertrochanteric left femur fracture. Mild varus angulation.  IMPRESSION: Comminuted left proximal femur fracture.   Electronically Signed   By: [name] M.D.   On: 05/04/2014 16:06   Dg Hip Operative Left  05/05/2014   CLINICAL DATA:  ORIF LEFT hip  EXAM: OPERATIVE LEFT HIP  COMPARISON:  Three digital C-arm fluoroscopic images obtained intraoperatively are compared to LEFT hip radiographs of 05/04/2014  FINDINGS: IM nail with compression screw across reduced intertrochanteric fracture of LEFT femur.  Bones appear demineralized.  Distal locking screw present.  No additional fracture or dislocation seen.  IMPRESSION: Post ORIF of intertrochanteric fracture LEFT femur.   Electronically Signed   By: [name] M.D.   On: 05/05/2014 18:57   Dg Ankle Complete Left  05/04/2014   CLINICAL DATA:  Fall.  Left ankle pain.  EXAM: LEFT ANKLE COMPLETE - 3+ VIEW  COMPARISON:  None.  FINDINGS: Bony demineralization. Having material obscures the lateral ankle and lateral part of the proximal foot. Reportedly these were the best obtainable images, but sensitivity for injury is reduced.  Atherosclerosis noted. The lower calcaneus and cuboid are excluded on the lateral projection. No well-defined ankle fracture.  IMPRESSION: 1. Reduced  sensitivity due to artifact and difficulty with positioning. No fracture observed. 2. Bony demineralization.   Electronically Signed   By: [name] M.D.   On: 05/04/2014 16:10         Subjective: Patient is presently confused, but intermittently complains of left ear pain. Denies any headache, chest pain, shortness breath, vomiting, diarrhea, abdominal  pain.  Objective: Filed Vitals:   05/07/14 0800 05/07/14 1156 05/07/14 1322 05/07/14 1505  BP:   145/38   Pulse:   108   Temp:   98.9 F (37.2 C)   TempSrc:   Axillary   Resp: 16 16 15 18   Height:      Weight:      SpO2:   95%     Intake/Output Summary (Last 24 hours) at 05/07/14 1543 Last data filed at 05/07/14 1330  Gross per 24 hour  Intake   1380 ml  Output    325 ml  Net   1055 ml   Weight change:  Exam:   General:  Pt is alert, follows commands appropriately, not in acute distress  HEENT: No icterus, No thrush, [place]/AT  Cardiovascular: RRR, S1/S2, no rubs, no gallops  Respiratory: Bibasilar crackles. No wheezing. Good air movement.  Abdomen: Soft/+BS, non tender, non distended, no guarding  Extremities: No edema, No lymphangitis, No petechiae, No rashes, no synovitis  Data Reviewed: Basic Metabolic Panel:  Recent Labs Lab 05/04/14 1618 05/05/14 0528 05/06/14 0507 05/07/14 0530  NA 132* 135* 139 137  K 3.9 4.0 3.8 3.9  CL 92* 98 103 102  CO2 24 25 24 25   GLUCOSE 158* 152* 168* 127*  BUN 20 23 18 23   CREATININE 0.72 0.61 0.64 0.66  CALCIUM 9.5 8.8 8.0* 8.2*   Liver Function Tests:  Recent Labs Lab 05/04/14 1618 05/05/14 0528  AST 17 17  ALT 10 10  ALKPHOS 81 66  BILITOT 0.3 0.3  PROT 7.9 6.8  ALBUMIN 3.7 3.1*   No results found for this basename: LIPASE, AMYLASE,  in the last 168 hours No results found for this basename: AMMONIA,  in the last 168 hours CBC:  Recent Labs Lab 05/04/14 1618 05/05/14 0528 05/06/14 0507 05/07/14 0530  WBC 13.3* 11.4* 12.5* 9.9  NEUTROABS 11.8*  --   --   --   HGB 12.3 10.0* 8.4* 7.6*  HCT 36.7 29.2* 25.1* 23.4*  MCV 88.9 86.9 90.0 91.4  PLT 225 213 158 170   Cardiac Enzymes: No results found for this basename: CKTOTAL, CKMB, CKMBINDEX, TROPONINI,  in the last 168 hours BNP: No components found with this basename: POCBNP,  CBG: No results found for this basename: GLUCAP,  in the last 168  hours  Recent Results (from the past 240 hour(s))  MRSA PCR SCREENING     Status: None   Collection Time    05/05/14  4:15 PM      Result Value Ref Range Status   MRSA by PCR NEGATIVE  NEGATIVE Final   Comment:            The GeneXpert MRSA Assay (FDA     approved for NASAL specimens     only), is one component of a     comprehensive MRSA colonization     surveillance program. It is not     intended to diagnose MRSA     infection nor to guide or     monitor treatment for     MRSA infections.     Scheduled  Meds: . cycloSPORINE  1 drop Both Eyes BID  . docusate sodium  100 mg Oral Daily  . donepezil  10 mg Oral Q supper  . enoxaparin (LOVENOX) injection  40 mg Subcutaneous Q24H  . hydrocortisone cream   Topical BID  . latanoprost  1 drop Both Eyes QHS  . methenamine  1 g Oral Q supper  . mirabegron ER  50 mg Oral Daily   Continuous Infusions: . sodium chloride 0.9 % 1,000 mL with potassium chloride 10 mEq infusion 50 mL/hr at 05/07/14 0313     Mirtie Bastyr, DO  [facility] Pager [phone]  If 7PM-7AM, please contact night-coverage [email or web address] Password [email or web address] 05/07/2014, 3:43 PM   LOS: 3 days

## 2014-05-07 NOTE — Clinical Documentation Improvement (Signed)
Possible Clinical Conditions?                      Acute Blood Loss Anemia Acute on chronic blood loss anemia Expected Acute Blood Loss Anemia Chronic blood loss anemia Precipitous drop in Hematocrit Other Condition Cannot Clinically Determine  Supporting Information: Risk Factors: (As per notes)05/05/14 INTRAMEDULLARY (IM) NAIL FEMORAL (Left), ORIF left hip fracture   Signs and Symptoms (unable to ambulate, weakness, dizziness, unable to participate in care)  Diagnostics: Component     Latest Ref Rng 05/04/2014 05/05/2014 05/06/2014 05/07/2014  RBC     3.87 - 5.11 MIL/uL 4.13 3.36 (L) 2.79 (L) 2.56 (L)  Hemoglobin     12.0 - 15.0 g/dL 12.3 10.0 (L) 8.4 (L) 7.6 (L)  HCT     36.0 - 46.0 % 36.7 29.2 (L) 25.1 (L) 23.4 (L)   Orders:(As per record) 05/07/14  Tranfuse 2 units  Thank You, Alessandra Grout, RN, BSN, CCDS,Clinical Documentation Specialist:  [phone]  [phone]=[employer] [place]- Health Information Management

## 2014-05-07 NOTE — Progress Notes (Signed)
Physical Therapy Treatment Patient Details Name: ELLOUISE MCWHIRTER MRN: [id number] DOB: 1919/04/02 Today's Date: 05/26/14    History of Present Illness s/p IM nail L hip; PMHx: dementia, HTN, compression fxs,  COPD    PT Comments    Pt with limited progress; transferred to [facility] per nsg request because pt had been unable to void this am; pt was able to void, RN aware; Pt still requiring +2 for mobility; Hgb down to 7.6, will get blood today per Dr. Carles Collet  Follow Up Recommendations  SNF     Equipment Recommendations  None recommended by PT    Recommendations for Other Services       Precautions / Restrictions Precautions Precautions: Fall Restrictions Weight Bearing Restrictions: Yes LLE Weight Bearing: Partial weight bearing    Mobility  Bed Mobility Overal bed mobility: Needs Assistance;+2 for physical assistance Bed Mobility: Supine to Sit;Sit to Supine     Supine to sit: Total assist;+2 for physical assistance Sit to supine: Total assist;+2 for physical assistance   General bed mobility comments: +2 for trunk and LEs  Transfers Overall transfer level: Needs assistance   Transfers: Sit to/from Stand;Stand Pivot Transfers Sit to Stand: +2 physical assistance;Total assist Stand pivot transfers: Total assist;+2 physical assistance       General transfer comment: +2 total assist, pt not uncooperative but unable to WB thru LEs at all today  Ambulation/Gait                 Stairs            Wheelchair Mobility    Modified Rankin (Stroke Patients Only)       Balance Overall balance assessment: Needs assistance Sitting-balance support: Feet supported;No upper extremity supported Sitting balance-Leahy Scale: Zero       Standing balance-Leahy Scale: Zero                      Cognition Arousal/Alertness: Lethargic Behavior During Therapy: Restless;Flat affect Overall Cognitive Status: History of cognitive impairments - at baseline                       Exercises      General Comments        Pertinent Vitals/Pain Pain Assessment: Faces Faces Pain Scale: Hurts a little bit Pain Location: left hip Pain Intervention(s): Premedicated before session;Repositioned    Home Living                      Prior Function            PT Goals (current goals can now be found in the care plan section) Acute Rehab PT Goals Patient Stated Goal: does not state/unable Time For Goal Achievement: 05/13/14 Potential to Achieve Goals: Fair Progress towards PT goals: Progressing toward goals    Frequency  Min 3X/week    PT Plan Current plan remains appropriate    Co-evaluation             End of Session Equipment Utilized During Treatment: Gait belt Activity Tolerance: Patient limited by fatigue Patient left: in bed;with call bell/phone within reach     Time: 3149-7026 PT Time Calculation (min): 24 min  Charges:  $Therapeutic Activity: 23-37 mins                    G Codes:      Khyri Hinzman 2014/05/26, 12:03 PM

## 2014-05-08 ENCOUNTER — Inpatient Hospital Stay (HOSPITAL_COMMUNITY): Payer: Medicare Other

## 2014-05-08 DIAGNOSIS — D62 Acute posthemorrhagic anemia: Secondary | ICD-10-CM

## 2014-05-08 DIAGNOSIS — I639 Cerebral infarction, unspecified: Secondary | ICD-10-CM

## 2014-05-08 DIAGNOSIS — I6340 Cerebral infarction due to embolism of unspecified cerebral artery: Secondary | ICD-10-CM

## 2014-05-08 LAB — URINE CULTURE
Colony Count: NO GROWTH
Culture: NO GROWTH

## 2014-05-08 LAB — BASIC METABOLIC PANEL
Anion gap: 9 (ref 5–15)
BUN: 19 mg/dL (ref 6–23)
CHLORIDE: 102 meq/L (ref 96–112)
CO2: 26 mEq/L (ref 19–32)
Calcium: 8 mg/dL — ABNORMAL LOW (ref 8.4–10.5)
Creatinine, Ser: 0.48 mg/dL — ABNORMAL LOW (ref 0.50–1.10)
GFR calc non Af Amer: 82 mL/min — ABNORMAL LOW (ref >90)
Glucose, Bld: 122 mg/dL — ABNORMAL HIGH (ref 70–99)
POTASSIUM: 3.9 meq/L (ref 3.7–5.3)
Sodium: 137 mEq/L (ref 137–147)

## 2014-05-08 LAB — TYPE AND SCREEN
ABO/RH(D): A POS
Antibody Screen: NEGATIVE
Unit division: 0
Unit division: 0

## 2014-05-08 LAB — CBC
HCT: 29.3 % — ABNORMAL LOW (ref 36.0–46.0)
Hemoglobin: 10 g/dL — ABNORMAL LOW (ref 12.0–15.0)
MCH: 30 pg (ref 26.0–34.0)
MCHC: 34.1 g/dL (ref 30.0–36.0)
MCV: 88 fL (ref 78.0–100.0)
PLATELETS: 155 10*3/uL (ref 150–400)
RBC: 3.33 MIL/uL — AB (ref 3.87–5.11)
RDW: 13.6 % (ref 11.5–15.5)
WBC: 9.2 10*3/uL (ref 4.0–10.5)

## 2014-05-08 LAB — GLUCOSE, CAPILLARY: GLUCOSE-CAPILLARY: 122 mg/dL — AB (ref 70–99)

## 2014-05-08 MED ORDER — HYDROCODONE-ACETAMINOPHEN 5-325 MG PO TABS: 1.0000 | ORAL_TABLET | Freq: Four times a day (QID) | ORAL | Status: AC | PRN

## 2014-05-08 MED ORDER — TRAMADOL HCL 50 MG PO TABS
50.0000 mg | ORAL_TABLET | Freq: Four times a day (QID) | ORAL | Status: DC | PRN
  Administered 2014-05-08: 50 mg via ORAL
  Filled 2014-05-08: qty 1

## 2014-05-08 MED ORDER — ASPIRIN 300 MG RE SUPP
300.0000 mg | Freq: Every day | RECTAL | Status: DC
  Administered 2014-05-08 – 2014-05-09 (×2): 300 mg via RECTAL
  Filled 2014-05-08 (×3): qty 1

## 2014-05-08 MED ORDER — HYDROCODONE-ACETAMINOPHEN 5-325 MG PO TABS
1.0000 | ORAL_TABLET | Freq: Four times a day (QID) | ORAL | Status: DC | PRN
  Administered 2014-05-10 – 2014-05-11 (×6): 1 via ORAL
  Administered 2014-05-12 (×2): 2 via ORAL
  Filled 2014-05-08 (×2): qty 1
  Filled 2014-05-08 (×3): qty 2
  Filled 2014-05-08 (×2): qty 1
  Filled 2014-05-08: qty 2

## 2014-05-08 MED ORDER — SODIUM CHLORIDE 0.9 % IV BOLUS (SEPSIS)
500.0000 mL | Freq: Once | INTRAVENOUS | Status: AC
  Administered 2014-05-08: 500 mL via INTRAVENOUS

## 2014-05-08 MED ORDER — ENSURE COMPLETE PO LIQD
237.0000 mL | Freq: Two times a day (BID) | ORAL | Status: DC
  Administered 2014-05-10 – 2014-05-12 (×3): 237 mL via ORAL

## 2014-05-08 MED ORDER — MORPHINE SULFATE 2 MG/ML IJ SOLN
1.0000 mg | INTRAMUSCULAR | Status: DC | PRN
  Administered 2014-05-09 – 2014-05-10 (×4): 2 mg via INTRAVENOUS
  Administered 2014-05-10: 1 mg via INTRAVENOUS
  Administered 2014-05-10 – 2014-05-11 (×2): 2 mg via INTRAVENOUS
  Administered 2014-05-11: 1 mg via INTRAVENOUS
  Filled 2014-05-08 (×8): qty 1

## 2014-05-08 MED ORDER — MORPHINE SULFATE 2 MG/ML IJ SOLN
1.0000 mg | Freq: Once | INTRAMUSCULAR | Status: AC
Start: 2014-05-08 — End: 2014-05-08
  Administered 2014-05-08: 1 mg via INTRAVENOUS
  Filled 2014-05-08: qty 1

## 2014-05-08 MED ORDER — POLYETHYLENE GLYCOL 3350 17 G PO PACK
17.0000 g | PACK | Freq: Two times a day (BID) | ORAL | Status: DC
  Administered 2014-05-08 – 2014-05-12 (×4): 17 g via ORAL
  Filled 2014-05-08 (×9): qty 1

## 2014-05-08 MED ORDER — MORPHINE SULFATE 2 MG/ML IJ SOLN
1.0000 mg | INTRAMUSCULAR | Status: DC | PRN
Start: 2014-05-08 — End: 2014-05-08
  Administered 2014-05-08: 1 mg via INTRAVENOUS
  Filled 2014-05-08: qty 1

## 2014-05-08 NOTE — Progress Notes (Signed)
CSW assisting with d/c planning. Tentative d/c summary has been sent to [facility] for possible d/c on SAT. SNF / daughter are aware of d/c plan. Week end CSW will assist with d/c planning to SNF.  Werner Lean LCSW [phone]

## 2014-05-08 NOTE — Progress Notes (Signed)
Physical Therapy Treatment Patient Details Name: [name] MRN: [id number] DOB: [birth year] Today's Date: 05/08/2014    History of Present Illness s/p IM nail L hip; PMHx: dementia, HTN, compression fxs,  COPD    PT Comments    Patient noted to not be moving, gripping L  UE. Also did not perform  Leg  Extension on L although followed commands to kick out on Right and actually counted the number of exercises. RN notified of new findings. MD was contacted. Continue PT as patient is able.   Follow Up Recommendations  SNF     Equipment Recommendations  None recommended by PT    Recommendations for Other Services       Precautions / Restrictions      Mobility  Bed Mobility Overal bed mobility: Needs Assistance;+2 for physical assistance;+ 2 for safety/equipment Bed Mobility: Rolling;Sidelying to Sit;Sit to Supine Rolling: +2 for safety/equipment;+2 for physical assistance;Total assist Sidelying to sit: Total assist;+2 for safety/equipment;HOB elevated   Sit to supine: Total assist;+2 for physical assistance   General bed mobility comments: +2 for trunk and LEs  Transfers                    Ambulation/Gait                 Stairs            Wheelchair Mobility    Modified Rankin (Stroke Patients Only)       Balance   Sitting-balance support: Feet supported;Single extremity supported Sitting balance-Leahy Scale: Zero Sitting balance - Comments: pt did list to L, but did support self while in sitting.                            Cognition Arousal/Alertness: Lethargic   Overall Cognitive Status: History of cognitive impairments - at baseline                      Exercises      General Comments        Pertinent Vitals/Pain Faces Pain Scale: Hurts a little bit (during rolling.) Pain Location: L hip Pain Intervention(s): Premedicated before session    Home Living                      Prior Function             PT Goals (current goals can now be found in the care plan section) Progress towards PT goals: Not progressing toward goals - comment (new L side weakness today.)    Frequency  Min 3X/week    PT Plan Current plan remains appropriate    Co-evaluation             End of Session   Activity Tolerance: Patient tolerated treatment well Patient left: in bed;with nursing/sitter in room;with family/visitor present     Time: 1538-1610 PT Time Calculation (min): 32 min  Charges:  $Therapeutic Activity: 23-37 mins                    G Codes:      [place], [place] 05/08/2014, 4:25 PM Tresa Endo PT [phone]

## 2014-05-08 NOTE — Progress Notes (Addendum)
PROGRESS NOTE  [name]:[id number] DOB: 03/19/1919 DOA: 05/04/2014 PCP: [name], MD  Assessment/Plan: Left comminuted femur fracture  - ORIF by Dr. [name] 05/05/14  - IV morphine caused increased somnolence-->d/ced  -has only required 2 doses tramadol 05/06/14  - PT-->recommends SNF  - Lovenox 40mg  [place] daily through 05/19/14  - norco or tramadol for pain  -50% WB on left leg per ortho  Acute Blood Loss Anemia  -partly dilutional  -Transfuse 2 units PRBC on 05/07/14  -Hgb 10.0 post transfusion  Recurrent UTIs on suppressive therapy with Hiprex and myrbetriq  - Urinalysis currently without evidence of infection  - Minimize use of foley catheter-->d/c foley 05/06/14  -05/06/14 repeat UA without pyuria  Tachycardia  -likely reactive from volume depeltion, pain, stress, anemia  -Patient's are overall improved after blood transfusion -give 500 NS today -no sob or chest pain  -TSH 3.230  -transfused 2 units PRBC on 05/09/61  Chronic diastolic heart failure, euvolemic  - Lasix three times weekly on hold  - Resume lasix after d/c  Dermatitis,  -continue steroid cream  Osteoporosis - on outpatient tx  Dementia  -- continue home medications  - Minimize sedating medications--more somnolent after receiving morphine  -tramadol or norco for pain  -d/c IV morphine  Dry eyes,  -stable, continue eye drops  -continue systane  Leukocytosis, likely reactive from fracture  stress demargination  -improved  - CXR and UA unremarkable  Hyponatremia,  -mild, sodium 132 at the time of admission.  -improved with IVF  Poor po intake -reconsulted nutrition to recommend supplementation Family Communication:   Son and daughter updtaed at beside Disposition Plan:   [facility] on 05/09/14         Procedures/Studies: Dg Chest 1 View  05/04/2014   CLINICAL DATA:  Preoperative films history of hypertension and COPD 8  EXAM: CHEST - 1 VIEW  COMPARISON:  02/02/2014  FINDINGS:  Multilevel kyphoplasty noted. Heart size upper normal. Vascular pattern normal. Calcification and mild prominence of the aortic arch stable. Lungs clear. Mitral annulus calcification noted.  IMPRESSION: No acute findings   Electronically Signed   By: Skipper Cliche M.D.   On: 05/04/2014 16:48   Dg Hip Complete Left  05/04/2014   CLINICAL DATA:  Trauma and pain.  EXAM: LEFT HIP - COMPLETE 2+ VIEW  COMPARISON:  Pelvic MRI of 07/30/2013.  FINDINGS: Moderate osteopenia. Vascular calcifications. Femoral heads are located. Comminuted intertrochanteric left femur fracture. Mild varus angulation.  IMPRESSION: Comminuted left proximal femur fracture.   Electronically Signed   By: [name] M.D.   On: 05/04/2014 16:06   Dg Hip Operative Left  05/05/2014   CLINICAL DATA:  ORIF LEFT hip  EXAM: OPERATIVE LEFT HIP  COMPARISON:  Three digital C-arm fluoroscopic images obtained intraoperatively are compared to LEFT hip radiographs of 05/04/2014  FINDINGS: IM nail with compression screw across reduced intertrochanteric fracture of LEFT femur.  Bones appear demineralized.  Distal locking screw present.  No additional fracture or dislocation seen.  IMPRESSION: Post ORIF of intertrochanteric fracture LEFT femur.   Electronically Signed   By: [name] M.D.   On: 05/05/2014 18:57   Dg Ankle Complete Left  05/04/2014   CLINICAL DATA:  Fall.  Left ankle pain.  EXAM: LEFT ANKLE COMPLETE - 3+ VIEW  COMPARISON:  None.  FINDINGS: Bony demineralization. Having material obscures the lateral ankle and lateral part of the proximal foot. Reportedly these were  the best obtainable images, but sensitivity for injury is reduced.  Atherosclerosis noted. The lower calcaneus and cuboid are excluded on the lateral projection. No well-defined ankle fracture.  IMPRESSION: 1. Reduced sensitivity due to artifact and difficulty with positioning. No fracture observed. 2. Bony demineralization.   Electronically Signed   By: [name] M.D.    On: 05/04/2014 16:10         Subjective: Patient denies fevers, chills, chest pain, shortness breath, nausea, vomiting, diarrhea, abdominal pain.  Objective: Filed Vitals:   05/07/14 2136 05/07/14 2230 05/07/14 2333 05/08/14 0439  BP: 135/61 138/54 146/62 136/53  Pulse: 104 109 109 98  Temp: 100.4 F (38 C) 100.2 F (37.9 C) 99.8 F (37.7 C) 99.3 F (37.4 C)  TempSrc: Oral Oral Oral Oral  Resp: 18 18 18 18   Height:      Weight:      SpO2: 100% 99% 100% 100%    Intake/Output Summary (Last 24 hours) at 05/08/14 0859 Last data filed at 05/07/14 2333  Gross per 24 hour  Intake 1326.33 ml  Output     75 ml  Net 1251.33 ml   Weight change:  Exam:   General:  Pt is alert, follows commands appropriately, not in acute distress  HEENT: No icterus, No thrush,  [place]/AT  Cardiovascular: RRR, S1/S2, no rubs, no gallops  Respiratory: Bibasilar crackles. No wheezing. Good air movement  Abdomen: Soft/+BS, non tender, non distended, no guarding  Extremities: No edema, No lymphangitis, No petechiae, No rashes, no synovitis  Data Reviewed: Basic Metabolic Panel:  Recent Labs Lab 05/04/14 1618 05/05/14 0528 05/06/14 0507 05/07/14 0530 05/08/14 0545  NA 132* 135* 139 137 137  K 3.9 4.0 3.8 3.9 3.9  CL 92* 98 103 102 102  CO2 24 25 24 25 26   GLUCOSE 158* 152* 168* 127* 122*  BUN 20 23 18 23 19   CREATININE 0.72 0.61 0.64 0.66 0.48*  CALCIUM 9.5 8.8 8.0* 8.2* 8.0*   Liver Function Tests:  Recent Labs Lab 05/04/14 1618 05/05/14 0528  AST 17 17  ALT 10 10  ALKPHOS 81 66  BILITOT 0.3 0.3  PROT 7.9 6.8  ALBUMIN 3.7 3.1*   No results found for this basename: LIPASE, AMYLASE,  in the last 168 hours No results found for this basename: AMMONIA,  in the last 168 hours CBC:  Recent Labs Lab 05/04/14 1618 05/05/14 0528 05/06/14 0507 05/07/14 0530 05/08/14 0545  WBC 13.3* 11.4* 12.5* 9.9 9.2  NEUTROABS 11.8*  --   --   --   --   HGB 12.3 10.0* 8.4* 7.6* 10.0*    HCT 36.7 29.2* 25.1* 23.4* 29.3*  MCV 88.9 86.9 90.0 91.4 88.0  PLT 225 213 158 170 155   Cardiac Enzymes: No results found for this basename: CKTOTAL, CKMB, CKMBINDEX, TROPONINI,  in the last 168 hours BNP: No components found with this basename: POCBNP,  CBG: No results found for this basename: GLUCAP,  in the last 168 hours  Recent Results (from the past 240 hour(s))  MRSA PCR SCREENING     Status: None   Collection Time    05/05/14  4:15 PM      Result Value Ref Range Status   MRSA by PCR NEGATIVE  NEGATIVE Final   Comment:            The GeneXpert MRSA Assay (FDA     approved for NASAL specimens     only), is one component of a  comprehensive MRSA colonization     surveillance program. It is not     intended to diagnose MRSA     infection nor to guide or     monitor treatment for     MRSA infections.     Scheduled Meds: . cycloSPORINE  1 drop Both Eyes BID  . docusate sodium  100 mg Oral Daily  . donepezil  10 mg Oral Q supper  . enoxaparin (LOVENOX) injection  40 mg Subcutaneous Q24H  . hydrocortisone cream   Topical BID  . latanoprost  1 drop Both Eyes QHS  . methenamine  1 g Oral Q supper  . mirabegron ER  50 mg Oral Daily  . sodium chloride  500 mL Intravenous Once   Continuous Infusions: . sodium chloride 0.9 % 1,000 mL with potassium chloride 10 mEq infusion Stopped (05/07/14 1600)     Zeola Brys, DO  [facility] Pager [phone]  If 7PM-7AM, please contact night-coverage [email or web address] Password [email or web address] 05/08/2014, 8:59 AM   LOS: 4 days

## 2014-05-08 NOTE — Progress Notes (Addendum)
Late entry for earlier encounter with patient  I was notified by nurse around 1600 that the patient had weakness in the left upper extremity when she tried to participate with physical therapy. The patient's daughter was at the bedside. After speaking with the patient's daughter and nursing staff, it was unclear the last time the patient was known well. The patient presently remains pleasantly confused which has been unchanged. The patient's daughter states that she did have some dysphagia type symptoms earlier in the day.  Because of concerns for stroke, CT of the brain was obtained. I also consulted neurology and spoke with Dr. [name] who recommended a stroke workup.CT of the brain revealed "New subacute stroke medially in the right parietal lobe, likely  watershed distribution between the ACA and MCA. No evidence of acute  intracranial hemorrhage"  MRI of the brain, carotid duplex, echocardiogram, lipid panel, hemoglobin A1c were ordered. The patient was made npo. Swallowing evaluation was ordered. PT and OT are already involved with the patient's care. The patient will be transferred to a telemetry unit. After speaking with the patient's daughter, she stated that she did not want the patient to have any tPA.  ASA suppository started.  DTat

## 2014-05-08 NOTE — Progress Notes (Signed)
Nutrition follow up/Consult  DOCUMENTATION CODES Per approved criteria  -Not Applicable   INTERVENTION: Ensure Complete po BID, each supplement provides 350 kcal and 13 grams of protein Downgrade diet to Dysphagia 2 with thin liquids RD to follow.  Noted possible tx to SNF tomorrow.  NUTRITION DIAGNOSIS: Inadequate oral intake related to inability to eat as evidenced by NPO- resolved New dx: Inadequate oral intake related to decreased level of alertness and difficulty chewing as evidenced by observation and report.   Goal: Advancement of diet-met New goal: Patient to meet >90% estimated needs with meals and snacks.  Monitor:  Intake, labs, weight trend, diet and supplement tolerance  Reason for Assessment: Consult for decreased po and supplement  80 y.o. female  Admitting Dx: <principal problem not specified>  ASSESSMENT: Patient s/p ORIF secondary to fracture, acute blood loss anemia, recurrent UTI's.  Hx includes dementia.  Observed patient during lunch. Falling asleep.  Unable to maintain good posture and continued to slouch. Tech reports that daughter attempted to feed patient breakfast with no intake secondary to increased lethargy. Tech fed patient lunch today with only a few bites intake plus 1/2 pudding.  Tolerated pudding well. Daughter has patient's teeth as "daughter can't get them in".  Recently had dentures evaluated for fit with no problems noted. Currently on Dysphagia thin liquid diet. Daughter reported that patient would sit up and feed herself with good intake prior to current procedure.   Nutrition Focused Physical Exam:  Subcutaneous Fat:  Orbital Region: wnl Upper Arm Region: wnl Thoracic and Lumbar Region: wnl  Muscle:  Temple Region: severe Clavicle Bone Region: wnl Clavicle and Acromion Bone Region: wnl Scapular Bone Region: n/a Dorsal Hand: wnl Patellar Region: n/a Anterior Thigh Region: n/a Posterior Calf Region: n/a  Edema: not  noted.    Height: Ht Readings from Last 1 Encounters:  05/05/14 4' 11" (1.499 m)    Weight: Wt Readings from Last 1 Encounters:  05/05/14 136 lb 7.4 oz (61.9 kg)    Ideal Body Weight: 98 lbs  % Ideal Body Weight: 139  Wt Readings from Last 10 Encounters:  05/05/14 136 lb 7.4 oz (61.9 kg)  05/05/14 136 lb 7.4 oz (61.9 kg)  01/28/14 134 lb 7.7 oz (61 kg)  06/06/13 137 lb (62.143 kg)  05/29/13 139 lb 14.4 oz (63.458 kg)  04/07/13 138 lb (62.596 kg)  12/19/12 143 lb (64.864 kg)  09/30/12 140 lb (63.504 kg)  09/27/12 142 lb (64.411 kg)  08/30/12 144 lb (65.318 kg)    Usual Body Weight: 134 lbs in May  % Usual Body Weight:  101  BMI:  Body mass index is 27.55 kg/(m^2).  Estimated Nutritional Needs: Kcal: 1400-1500 Protein: 50-60 gm Fluid: 1.4-1.6L  Skin: intact  Diet Order: Dysphagia 3 thin  EDUCATION NEEDS: -No education needs identified at this time   Intake/Output Summary (Last 24 hours) at 05/08/14 1424 Last data filed at 05/08/14 1301  Gross per 24 hour  Intake   1123 ml  Output      0 ml  Net   1123 ml     Labs:   Recent Labs Lab 05/06/14 0507 05/07/14 0530 05/08/14 0545  NA 139 137 137  K 3.8 3.9 3.9  CL 103 102 102  CO2 24 25 26  BUN 18 23 19  CREATININE 0.64 0.66 0.48*  CALCIUM 8.0* 8.2* 8.0*  GLUCOSE 168* 127* 122*    CBG (last 3)  No results found for this basename: GLUCAP,  in   the last 72 hours  Scheduled Meds: . cycloSPORINE  1 drop Both Eyes BID  . docusate sodium  100 mg Oral Daily  . donepezil  10 mg Oral Q supper  . enoxaparin (LOVENOX) injection  40 mg Subcutaneous Q24H  . hydrocortisone cream   Topical BID  . latanoprost  1 drop Both Eyes QHS  . methenamine  1 g Oral Q supper  . mirabegron ER  50 mg Oral Daily  . polyethylene glycol  17 g Oral BID    Continuous Infusions: . sodium chloride 0.9 % 1,000 mL with potassium chloride 10 mEq infusion Stopped (05/07/14 1600)    Past Medical History  Diagnosis Date   . Anemia   . Hyperlipidemia   . Hypertension   . Seizure disorder     poss from hponatremia when had pneumonia  . Recurrent UTI   . Urinary incontinence     mild  . Osteoporosis     Grade II spondylotistesis at l4-l5 testing in [place] 2008 and confirmed that she has osteoporosis  . Spondylolisthesis of lumbar region     Grade II spondylotistesis at l4-l5 testing in [place] 2008   . Contusion of leg 08/06/2011  . Hematoma 08/06/2011    leg near shin no bov underlying bony abnormality and no infection. should take a while to resolve observe for infection   . Chest wall pain     "off and on" (05/26/2013)  . COPD (chronic obstructive pulmonary disease)   . Pleural effusion     "a few times" (05/26/2013)  . Seizures ~ 2009    "after pneumonia; electrolytes were all messed up" (05/26/2013)  . Pneumonia 09/2002; ~ 2009;   . Arthritis   . Osteoarthritis   . Thoracic compression fracture   . Thoracic back pain   . Skin cancer 2012    leg   . Dementia     Past Surgical History  Procedure Laterality Date  . Abdominal hysterectomy    . Lumbar disc surgery    . Breast lumpectomy Bilateral      had fibrofatty tissue  . Cataract extraction w/ intraocular lens implant Left   . Glaucoma surgery Left   . Eye surgery    . Skin cancer excision  2012  . Femur im nail Left 05/05/2014    Procedure: INTRAMEDULLARY (IM) NAIL FEMORAL;  Surgeon: Matthew D Olin, MD;  Location: [facility];  Service: Orthopedics;  Laterality: Left;     , RD, LDN Clinical Inpatient Dietitian Pager:  [phone] Weekend and after hours pager:  [phone]    

## 2014-05-08 NOTE — Consult Note (Signed)
Neurology Consultation Reason for Consult: Stroke Referring Physician: Orson Eva  CC: Left-sided weakness  History is obtained from: Daughter  HPI: [name] is a 80 y.o. female with a history of recent femoral fracture and was admitted and had recent surgery for this. There is some question that she might have had some left-sided weakness yesterday, but this is unclear. Today, physical therapy was working with the patient and at that time it was noted that she had left-sided weakness. Due to no clear last seen well, but could stroke was not called   LKW: Unknown tpa given?: no, outside of window    ROS:  Unable to obtain due to altered mental status.   Past Medical History  Diagnosis Date  . Anemia   . Hyperlipidemia   . Hypertension   . Seizure disorder     poss from hponatremia when had pneumonia  . Recurrent UTI   . Urinary incontinence     mild  . Osteoporosis     Grade II spondylotistesis at l4-l5 testing in [place] 2008 and confirmed that she has osteoporosis  . Spondylolisthesis of lumbar region     Grade II spondylotistesis at l4-l5 testing in [place] 2008   . Contusion of leg 08/06/2011  . Hematoma 08/06/2011    leg near shin no bov underlying bony abnormality and no infection. should take a while to resolve observe for infection   . Chest wall pain     "off and on" (05/26/2013)  . COPD (chronic obstructive pulmonary disease)   . Pleural effusion     "a few times" (05/26/2013)  . Seizures ~ 2009    "after pneumonia; electrolytes were all messed up" (05/26/2013)  . Pneumonia 09/2002; ~ 2009;   . Arthritis   . Osteoarthritis   . Thoracic compression fracture   . Thoracic back pain   . Skin cancer 2012    leg   . Dementia     Family History: Daughter-stroke  Social History: Lives with daughter  Exam: Current vital signs: BP 159/74  Pulse 106  Temp(Src) 98.6 F (37 C) (Oral)  Resp 18  Ht 4\' 11"  (1.499 m)  Wt 61.9 kg (136 lb 7.4 oz)  BMI 27.55 kg/m2   SpO2 100% Vital signs in last 24 hours: Temp:  [98.6 F (37 C)-100.4 F (38 C)] 98.6 F (37 C) (09/04 1449) Pulse Rate:  [98-117] 106 (09/04 1605) Resp:  [16-18] 18 (09/04 1605) BP: (135-159)/(44-79) 159/74 mmHg (09/04 1605) SpO2:  [99 %-100 %] 100 % (09/04 1605)  General: In bed, NAD CV: Regular in rhythm Mental Status: Patient is awake, alert, oriented to person only She clearly has dementia No signs of aphasia but she does have a left neglect Cranial Nerves: II: Visual Fields are full. Pupils are equal, round, and reactive to light.  Discs are difficult to visualize. III,IV, VI: EOMI without ptosis or diploplia.  V: Facial sensation is symmetric to temperature VII: Facial movement is notable for mild left-sided weakness VIII: hearing is intact to voice X: Uvula elevates symmetrically XI: Shoulder shrug is symmetric. XII: tongue is midline without atrophy or fasciculations.  Motor: Tone is normal. Bulk is normal. 5/5 strength was present on the right, she has no voluntary movement of the left arm, she does withdraw her left leg to noxious stimuli, but little movement voluntarily Sensory: Sensation is decreased on the left Deep Tendon Reflexes: 2+ and symmetric in the biceps and patellae.  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 I discussed surgery with the daughter who states that she would not want surgery even if needed and therefore carotid Dopplers are not needed. Anticoagulation may be reasonable as I suspect she will no longer be a fall risk after this event as she will not be  walking much.   Recommendations: 1. HgbA1c, fasting lipid panel 2. MRI of the brain without contrast 3. Frequent neuro checks 4. Echocardiogram 5. Prophylactic therapy-Antiplatelet med: Aspirin - dose 325mg  PO or 300mg  PR 6 Risk factor modification 7. Telemetry monitoring 8. PT consult, OT consult, Speech consult    Roland Rack, MD [facility] [phone]  If 7pm- 7am, please page neurology on call as listed in [place].

## 2014-05-08 NOTE — Discharge Summary (Signed)
Physician Discharge Summary  [name] OVA:[id number] DOB: 17-Jan-1919 DOA: 05/04/2014  PCP: [name], MD  Admit date: 05/04/2014 Discharge date: 05/09/14 Recommendations for Outpatient Follow-up:  1. Pt will need to follow up with PCP in 2 weeks post discharge 2. Please also check CBC 1 week 3. Systane (preservative free)--apply one drop to both eyes every 2 hours.  Please recap after use.  Each individual packet is good for 24hrs--discard and use new packet every 24 hours - Lovenox 40mg  Story daily through 05/19/14   Discharge Diagnoses:  Left comminuted femur fracture  - ORIF by Dr. [name] 05/05/14  - IV morphine caused increased somnolence-->d/ced -has only required 2 doses tramadol 05/06/14  -pain controlled with prn norco - PT-->recommends SNF  - Lovenox 40mg  [place] daily through 05/19/14  - norco or tramadol for pain -50% WB on left leg per ortho Acute Blood Loss Anemia  -partly dilutional  -Transfuse 2 units PRBC on 05/07/14 -Hgb 10.0 post transfusion Recurrent UTIs on suppressive therapy with Hiprex and myrbetriq  - Urinalysis currently without evidence of infection  - Minimize use of foley catheter-->d/c foley 05/06/14 -05/06/14 repeat UA without pyuria Tachycardia  -likely reactive from pain, stress, anemia -Patient's HR overall improved after blood transfusion and IV fluids -no sob or chest pain  -TSH 3.230  -transfused 2 units PRBC on 0/4/59 Chronic diastolic heart failure, euvolemic  - Lasix three times weekly on hold  - Resume lasix after d/c starting on 05/11/14 if po intake improves Dermatitis,  -continue steroid cream  Osteoporosis - on outpatient tx  Dementia  -- continue home medications  - Minimize sedating medications--more somnolent after receiving morphine -tramadol and norco for pain -d/c IV morphine Dry eyes,  -stable, continue eye drops  -continue systane  Leukocytosis, likely reactive from fracture  stress demargination  -improved  - CXR and UA  unremarkable   Hyponatremia,  -mild, sodium 135.  -improved with IVF  Poor po intake -reconsulted nutrition to recommend supplementation Access: PIV  IVF: off  Proph: SCD  Code Status: DNR    Discharge Condition: stable  Disposition: SNF Follow-up Information   Follow up with [name], MD In 2 weeks. (For suture removal)    Specialty:  Orthopedic Surgery   Contact information:   [address] [place] [address] [phone]       Diet:soft/dysphagia 3 Wt Readings from Last 3 Encounters:  05/05/14 61.9 kg (136 lb 7.4 oz)  05/05/14 61.9 kg (136 lb 7.4 oz)  01/28/14 61 kg (134 lb 7.7 oz)    History of present illness:  80 y.o. female has a past medical history significant for moderate to severe dementia, HLD, osteoporosis, urinary incontinence, COPD, presented with mechanical fall resulting in left femur fracture. Patient has underlying dementia and cannot contribute to the story. Daughter denies recent fever or chills, denies that the patient complained of chest pain, shortness of breath, lightheadedness or dizziness, and apparently she is at baseline.  Orthopedics, Dr. [name], was consulted.  He performed ORIF Left comminuted proximal femur fracture on 05/04/2014. Postoperatively, PT was consulted. They recommended skilled nursing facility. The patient had blood loss anemia after the surgery. She required 2 units PRBCs were transfused on 05/07/2014. The patient did have some increased somnolence with IV opioids. These were discontinued. In addition, the patient also has some tachycardia which is likely reactive due to the patient's acute medical condition as well as her anemia.       Consultants: Ortho--Dr. [name]  Discharge Exam: Filed Vitals:   05/08/14 0439  BP: 136/53  Pulse: 98  Temp: 99.3 F (37.4 C)  Resp: 18   Filed Vitals:   05/07/14 2136 05/07/14 2230 05/07/14 2333 05/08/14 0439  BP: 135/61 138/54 146/62 136/53  Pulse: 104 109 109  98  Temp: 100.4 F (38 C) 100.2 F (37.9 C) 99.8 F (37.7 C) 99.3 F (37.4 C)  TempSrc: Oral Oral Oral Oral  Resp: 18 18 18 18   Height:      Weight:      SpO2: 100% 99% 100% 100%   General: Awake and alert, NAD, pleasant, cooperative Cardiovascular: RRR, no rub, no gallop, no S3 Respiratory: Bibasilar crackles. No wheezing. Abdomen:soft, nontender, nondistended, positive bowel sounds Extremities: No edema, No lymphangitis, no petechiae  Discharge Instructions      Discharge Instructions   Partial weight bearing    Complete by:  As directed   50%     Partial weight bearing    Complete by:  As directed   % Body Weight:  50  Laterality:  left  Extremity:  Lower            Medication List         acetaminophen 325 MG tablet  Commonly known as:  TYLENOL  Take 2 tablets (650 mg total) by mouth every 6 (six) hours as needed (pain).     aspirin EC 81 MG tablet  Take 81 mg by mouth every other day.     Calcium Carb-Cholecalciferol 500-600 MG-UNIT Chew  Chew 1 tablet by mouth 2 (two) times daily.     cycloSPORINE 0.05 % ophthalmic emulsion  Commonly known as:  RESTASIS  Place 1 drop into both eyes 2 (two) times daily.     docusate sodium 100 MG capsule  Commonly known as:  COLACE  Take 100 mg by mouth daily.     donepezil 10 MG tablet  Commonly known as:  ARICEPT  Take 10 mg by mouth daily with supper.     enoxaparin 40 MG/0.4ML injection  Commonly known as:  LOVENOX  Inject 0.4 mLs (40 mg total) into the skin daily.     furosemide 20 MG tablet  Commonly known as:  LASIX  20 mg three times weekly     HYDROcodone-acetaminophen 5-325 MG per tablet  Commonly known as:  NORCO/VICODIN  Take 1 tablet by mouth every 6 (six) hours as needed for moderate pain.     ibandronate 150 MG tablet  Commonly known as:  BONIVA  Take 150 mg by mouth every 30 (thirty) days. Take in the morning with a full glass of water, on an empty stomach, and do not take anything else by  mouth or lie down for the next 30 min.; on the 1st Sunday of each month     methenamine 1 G tablet  Commonly known as:  HIPREX  Take 1 g by mouth daily with supper. Crushes in applesauce.     mirabegron ER 50 MG Tb24 tablet  Commonly known as:  MYRBETRIQ  Take 50 mg by mouth daily.     Polyethyl Glycol-Propyl Glycol 0.4-0.3 % Soln  Commonly known as:  SYSTANE PRESERVATIVE FREE  Place 1 drop into both eyes every 2 (two) hours.     potassium chloride SA 20 MEQ tablet  Commonly known as:  K-DUR,KLOR-CON  20 meq three times weekly     PRESCRIPTION MEDICATION  Place 1 drop into both eyes 4 (four) times daily. Blood Serum  Tears compounded at [facility] 1 drop into both eyes 4 (four) times daily. acetylcysteine eye drops compounded at [facility] (use with hot compress)     traMADol-acetaminophen 37.5-325 MG per tablet  Commonly known as:  ULTRACET  Take 1 tablet by mouth every 6 (six) hours as needed.     Travoprost (BAK Free) 0.004 % Soln ophthalmic solution  Commonly known as:  TRAVATAN  Place 1 drop into both eyes at bedtime.         The results of significant diagnostics from this hospitalization (including imaging, microbiology, ancillary and laboratory) are listed below for reference.    Significant Diagnostic Studies: Dg Chest 1 View  05/04/2014   CLINICAL DATA:  Preoperative films history of hypertension and COPD 8  EXAM: CHEST - 1 VIEW  COMPARISON:  02/02/2014  FINDINGS: Multilevel kyphoplasty noted. Heart size upper normal. Vascular pattern normal. Calcification and mild prominence of the aortic arch stable. Lungs clear. Mitral annulus calcification noted.  IMPRESSION: No acute findings   Electronically Signed   By: Skipper Cliche M.D.   On: 05/04/2014 16:48   Dg Hip Complete Left  05/04/2014   CLINICAL DATA:  Trauma and pain.  EXAM: LEFT HIP - COMPLETE 2+ VIEW  COMPARISON:  Pelvic MRI of 07/30/2013.   FINDINGS: Moderate osteopenia. Vascular calcifications. Femoral heads are located. Comminuted intertrochanteric left femur fracture. Mild varus angulation.  IMPRESSION: Comminuted left proximal femur fracture.   Electronically Signed   By: [name] M.D.   On: 05/04/2014 16:06   Dg Hip Operative Left  05/05/2014   CLINICAL DATA:  ORIF LEFT hip  EXAM: OPERATIVE LEFT HIP  COMPARISON:  Three digital C-arm fluoroscopic images obtained intraoperatively are compared to LEFT hip radiographs of 05/04/2014  FINDINGS: IM nail with compression screw across reduced intertrochanteric fracture of LEFT femur.  Bones appear demineralized.  Distal locking screw present.  No additional fracture or dislocation seen.  IMPRESSION: Post ORIF of intertrochanteric fracture LEFT femur.   Electronically Signed   By: [name] M.D.   On: 05/05/2014 18:57   Dg Ankle Complete Left  05/04/2014   CLINICAL DATA:  Fall.  Left ankle pain.  EXAM: LEFT ANKLE COMPLETE - 3+ VIEW  COMPARISON:  None.  FINDINGS: Bony demineralization. Having material obscures the lateral ankle and lateral part of the proximal foot. Reportedly these were the best obtainable images, but sensitivity for injury is reduced.  Atherosclerosis noted. The lower calcaneus and cuboid are excluded on the lateral projection. No well-defined ankle fracture.  IMPRESSION: 1. Reduced sensitivity due to artifact and difficulty with positioning. No fracture observed. 2. Bony demineralization.   Electronically Signed   By: [name] M.D.   On: 05/04/2014 16:10     Microbiology: Recent Results (from the past 240 hour(s))  MRSA PCR SCREENING     Status: None   Collection Time    05/05/14  4:15 PM      Result Value Ref Range Status   MRSA by PCR NEGATIVE  NEGATIVE Final   Comment:            The GeneXpert MRSA Assay (FDA     approved for NASAL specimens     only), is one component of a     comprehensive MRSA colonization     surveillance program. It is not      intended to diagnose MRSA     infection nor to  guide or     monitor treatment for     MRSA infections.     Labs: Basic Metabolic Panel:  Recent Labs Lab 05/04/14 1618 05/05/14 0528 05/06/14 0507 05/07/14 0530 05/08/14 0545  NA 132* 135* 139 137 137  K 3.9 4.0 3.8 3.9 3.9  CL 92* 98 103 102 102  CO2 24 25 24 25 26   GLUCOSE 158* 152* 168* 127* 122*  BUN 20 23 18 23 19   CREATININE 0.72 0.61 0.64 0.66 0.48*  CALCIUM 9.5 8.8 8.0* 8.2* 8.0*   Liver Function Tests:  Recent Labs Lab 05/04/14 1618 05/05/14 0528  AST 17 17  ALT 10 10  ALKPHOS 81 66  BILITOT 0.3 0.3  PROT 7.9 6.8  ALBUMIN 3.7 3.1*   No results found for this basename: LIPASE, AMYLASE,  in the last 168 hours No results found for this basename: AMMONIA,  in the last 168 hours CBC:  Recent Labs Lab 05/04/14 1618 05/05/14 0528 05/06/14 0507 05/07/14 0530 05/08/14 0545  WBC 13.3* 11.4* 12.5* 9.9 9.2  NEUTROABS 11.8*  --   --   --   --   HGB 12.3 10.0* 8.4* 7.6* 10.0*  HCT 36.7 29.2* 25.1* 23.4* 29.3*  MCV 88.9 86.9 90.0 91.4 88.0  PLT 225 213 158 170 155   Cardiac Enzymes: No results found for this basename: CKTOTAL, CKMB, CKMBINDEX, TROPONINI,  in the last 168 hours BNP: No components found with this basename: POCBNP,  CBG: No results found for this basename: GLUCAP,  in the last 168 hours  Time coordinating discharge:  Greater than 30 minutes  Signed:  Bronislaus Verdell, DO [facility] Pager: [phone] 05/08/2014, 9:16 AM

## 2014-05-08 NOTE — Progress Notes (Signed)
Called report to [place], nurse on [place]. All questions answered.

## 2014-05-08 NOTE — Progress Notes (Addendum)
Patient's left side flaccid. Charge nurse notified. Dr. Carles Collet notified. Rapid Response called. AC notified.   68 CT scan has been ordered and Dr. Carles Collet stated he will consult neurology.

## 2014-05-08 NOTE — Progress Notes (Signed)
     Subjective: 3 Days Post-Op Procedure(s) (LRB): INTRAMEDULLARY (IM) NAIL FEMORAL (Left)   Patient is resting comfortably in bed. No events throughout the night.   Objective:   VITALS:   Filed Vitals:   05/08/14 0439  BP: 136/53  Pulse: 98  Temp: 99.3 F (37.4 C)  Resp: 18    No cellulitis present Compartment soft  LABS  Recent Labs  05/06/14 0507 05/07/14 0530 05/08/14 0545  HGB 8.4* 7.6* 10.0*  HCT 25.1* 23.4* 29.3*  WBC 12.5* 9.9 9.2  PLT 158 170 155     Recent Labs  05/06/14 0507 05/07/14 0530 05/08/14 0545  NA 139 137 137  K 3.8 3.9 3.9  BUN 18 23 19   CREATININE 0.64 0.66 0.48*  GLUCOSE 168* 127* 122*     Assessment/Plan: 3 Days Post-Op Procedure(s) (LRB): INTRAMEDULLARY (IM) NAIL FEMORAL (Left) Up with therapy Discharge to SNF eventually, when ready  Ortho recommendations:  Lovenox 40 mg qd for 14 days for anticoagulation, unless other medically indicated  (Rx written).  Ultracet for pain management (Rx written).  MiraLax and Colace for constipation  Iron 325 mg tid for 2-3 weeks.  WB 50 on the left leg.  Daily dressing changes with 4x4 guaze and tape.  Follow up in 2 weeks at [facility]. Follow up with OLIN,Kimila Papaleo D in 2 weeks.  Contact information:  [facility] [address], [address] Jaquari Reckner   PAC  05/08/2014, 9:02 AM

## 2014-05-09 DIAGNOSIS — I6350 Cerebral infarction due to unspecified occlusion or stenosis of unspecified cerebral artery: Secondary | ICD-10-CM

## 2014-05-09 DIAGNOSIS — I059 Rheumatic mitral valve disease, unspecified: Secondary | ICD-10-CM

## 2014-05-09 DIAGNOSIS — I639 Cerebral infarction, unspecified: Secondary | ICD-10-CM

## 2014-05-09 LAB — BASIC METABOLIC PANEL
Anion gap: 8 (ref 5–15)
BUN: 13 mg/dL (ref 6–23)
CHLORIDE: 100 meq/L (ref 96–112)
CO2: 28 meq/L (ref 19–32)
Calcium: 8 mg/dL — ABNORMAL LOW (ref 8.4–10.5)
Creatinine, Ser: 0.41 mg/dL — ABNORMAL LOW (ref 0.50–1.10)
GFR calc non Af Amer: 86 mL/min — ABNORMAL LOW (ref >90)
Glucose, Bld: 109 mg/dL — ABNORMAL HIGH (ref 70–99)
Potassium: 3.7 mEq/L (ref 3.7–5.3)
SODIUM: 136 meq/L — AB (ref 137–147)

## 2014-05-09 LAB — CBC
HEMATOCRIT: 29 % — AB (ref 36.0–46.0)
Hemoglobin: 9.7 g/dL — ABNORMAL LOW (ref 12.0–15.0)
MCH: 30.2 pg (ref 26.0–34.0)
MCHC: 33.4 g/dL (ref 30.0–36.0)
MCV: 90.3 fL (ref 78.0–100.0)
Platelets: 180 10*3/uL (ref 150–400)
RBC: 3.21 MIL/uL — ABNORMAL LOW (ref 3.87–5.11)
RDW: 13.5 % (ref 11.5–15.5)
WBC: 7.3 10*3/uL (ref 4.0–10.5)

## 2014-05-09 LAB — LIPID PANEL
CHOLESTEROL: 127 mg/dL (ref 0–200)
HDL: 34 mg/dL — AB (ref >39)
LDL CALC: 76 mg/dL (ref 0–99)
Total CHOL/HDL Ratio: 3.7 RATIO
Triglycerides: 85 mg/dL (ref <150)
VLDL: 17 mg/dL (ref 0–40)

## 2014-05-09 LAB — HEMOGLOBIN A1C
Hgb A1c MFr Bld: 6.1 % — ABNORMAL HIGH (ref <5.7)
Mean Plasma Glucose: 128 mg/dL — ABNORMAL HIGH (ref <117)

## 2014-05-09 MED ORDER — HYDROCORTISONE 1 % EX CREA
TOPICAL_CREAM | Freq: Two times a day (BID) | CUTANEOUS | Status: DC
  Administered 2014-05-09 – 2014-05-12 (×7): via TOPICAL
  Filled 2014-05-09 (×2): qty 28

## 2014-05-09 MED ORDER — ASPIRIN 325 MG PO TABS
325.0000 mg | ORAL_TABLET | Freq: Every day | ORAL | Status: DC
  Administered 2014-05-10 – 2014-05-12 (×3): 325 mg via ORAL
  Filled 2014-05-09 (×4): qty 1

## 2014-05-09 MED ORDER — RESOURCE THICKENUP CLEAR PO POWD
ORAL | Status: DC | PRN
  Filled 2014-05-09: qty 125

## 2014-05-09 MED ORDER — BISACODYL 10 MG RE SUPP
10.0000 mg | Freq: Once | RECTAL | Status: AC
  Administered 2014-05-09: 10 mg via RECTAL
  Filled 2014-05-09: qty 1

## 2014-05-09 NOTE — Clinical Social Work Note (Signed)
CSW has prepared DC packet and placed on patient's chart. CSW left CSW number on packet for RN to call if the patient will be ready for DC to [facility] today. CSW will continue to follow.  Liz Beach MSW, [place], [place], [phone]

## 2014-05-09 NOTE — Progress Notes (Signed)
Echocardiogram 2D Echocardiogram has been performed.  [name] 05/09/2014, 2:36 PM

## 2014-05-09 NOTE — Progress Notes (Signed)
PROGRESS NOTE  [name] WNI:[id number] DOB: 1919/01/13 DOA: 05/04/2014 PCP: [name], MD  Assessment/Plan: Left comminuted femur fracture  - ORIF by Dr. [name] 05/05/14  - IV morphine caused increased somnolence-->d/ced  -pain controlled with prn norco  - PT-->recommends SNF  - Lovenox 40mg  Cat[place] daily through 05/19/14  - norco or tramadol for pain  -50% WB on left leg per ortho  Acute nonhemorrhagic stroke -MR Brain--Small to moderate-sized acute right frontoparietal infarct; Small focus acute to subacute cortical infarction in the right  parietal lobe  -case discussed with Dr. [name] -[facility]--[id number]%, grade 2 diastolic dysfx, moderate to severe MR with possible torn MV cord -discussed with son and daughter--they do not want any surgery and favor tx with antiplatelet agents only, not anticoagulation -As a result, will not pursue any further workup or pursue TEE -Continue ASA 325mg  daily -d/c tele--present tele for past 24 hours without any Afib Dysphagia -pudding thicken liquids -MBS ordered Acute Blood Loss Anemia  -partly dilutional  -Transfuse 2 units PRBC on 05/07/14  -Hgb 10.0 post transfusion  Recurrent UTIs on suppressive therapy with Hiprex and myrbetriq  - Urinalysis currently without evidence of infection  - Minimize use of foley catheter-->d/c foley 05/06/14  -05/06/14 repeat UA without pyuria  Tachycardia  -likely reactive from pain, stress, anemia  -Patient's HR overall improved after blood transfusion and IV fluids  -no sob or chest pain  -TSH 3.230  -transfused 2 units PRBC on 02/10/66  Chronic diastolic heart failure, euvolemic  - Lasix three times weekly on hold  - Resume lasix after d/c starting on 05/11/14 if po intake improves  Dermatitis,  -continue steroid cream  Osteoporosis - on outpatient tx  Dementia  -- continue home medications  - Minimize sedating medications--more somnolent after receiving morphine  -tramadol and norco for  pain  -d/c IV morphine  Dry eyes,  -stable, continue eye drops  -continue systane  Leukocytosis, likely reactive from fracture  stress demargination  -improved  - CXR and UA unremarkable  Poor po intake  -reconsulted nutrition to recommend supplementation Constipation -bisacodyl supp  Family Communication:   Son and daughter updated. Total time 35 min, >50% spent counseling and coordinating care Disposition Plan:   SNF when medically stable       Procedures/Studies: Dg Chest 1 View  05/04/2014   CLINICAL DATA:  Preoperative films history of hypertension and COPD 8  EXAM: CHEST - 1 VIEW  COMPARISON:  02/02/2014  FINDINGS: Multilevel kyphoplasty noted. Heart size upper normal. Vascular pattern normal. Calcification and mild prominence of the aortic arch stable. Lungs clear. Mitral annulus calcification noted.  IMPRESSION: No acute findings   Electronically Signed   By: Skipper Cliche M.D.   On: 05/04/2014 16:48   Dg Hip Complete Left  05/04/2014   CLINICAL DATA:  Trauma and pain.  EXAM: LEFT HIP - COMPLETE 2+ VIEW  COMPARISON:  Pelvic MRI of 07/30/2013.  FINDINGS: Moderate osteopenia. Vascular calcifications. Femoral heads are located. Comminuted intertrochanteric left femur fracture. Mild varus angulation.  IMPRESSION: Comminuted left proximal femur fracture.   Electronically Signed   By: [name] M.D.   On: 05/04/2014 16:06   Dg Hip Operative Left  05/05/2014   CLINICAL DATA:  ORIF LEFT hip  EXAM: OPERATIVE LEFT HIP  COMPARISON:  Three digital C-arm fluoroscopic images obtained intraoperatively are compared to LEFT hip radiographs of 05/04/2014  FINDINGS: IM nail with compression screw across reduced intertrochanteric  fracture of LEFT femur.  Bones appear demineralized.  Distal locking screw present.  No additional fracture or dislocation seen.  IMPRESSION: Post ORIF of intertrochanteric fracture LEFT femur.   Electronically Signed   By: [name] M.D.   On: 05/05/2014 18:57    Dg Ankle Complete Left  05/04/2014   CLINICAL DATA:  Fall.  Left ankle pain.  EXAM: LEFT ANKLE COMPLETE - 3+ VIEW  COMPARISON:  None.  FINDINGS: Bony demineralization. Having material obscures the lateral ankle and lateral part of the proximal foot. Reportedly these were the best obtainable images, but sensitivity for injury is reduced.  Atherosclerosis noted. The lower calcaneus and cuboid are excluded on the lateral projection. No well-defined ankle fracture.  IMPRESSION: 1. Reduced sensitivity due to artifact and difficulty with positioning. No fracture observed. 2. Bony demineralization.   Electronically Signed   By: [name] M.D.   On: 05/04/2014 16:10   Ct Head Wo Contrast  05/08/2014   CLINICAL DATA:  Altered mental status and weakness.  Code stroke.  EXAM: CT HEAD WITHOUT CONTRAST  TECHNIQUE: Contiguous axial images were obtained from the base of the skull through the vertex without intravenous contrast.  COMPARISON:  01/27/2014 head CT.  FINDINGS: There is new low-density medially within the right parietal lobe on images 22-28. Chronic small vessel ischemic changes in the periventricular white matter bilaterally are stable. There is a stable small lacunar infarct inferiorly in the right cerebellum. There is stable mild generalized atrophy. There is no evidence of acute intracranial hemorrhage, mass lesion or extra-axial fluid collection. There is no hydrocephalus.  The visualized paranasal sinuses are clear. There is a persistent right-sided mastoid effusion which appears slightly worse, but without associated coalescence. The mastoid air cells on the left and middle ears are clear bilaterally. No acute calvarial findings are seen.  IMPRESSION: New subacute stroke medially in the right parietal lobe, likely watershed distribution between the ACA and MCA. No evidence of acute intracranial hemorrhage.  Otherwise stable chronic small vessel ischemic changes as described. Mildly progressive right  mastoid effusion.  These results were called by telephone at the time of interpretation on 05/08/2014 at 4:39 pm to the patient's nurse, Joy, who verbally acknowledged these results.   Electronically Signed   By: [name] M.D.   On: 05/08/2014 16:40   Mr Brain Wo Contrast  05/08/2014   CLINICAL DATA:  Left hemiplegia.  EXAM: MRI HEAD WITHOUT CONTRAST  TECHNIQUE: Multiplanar, multiecho pulse sequences of the brain and surrounding structures were obtained without intravenous contrast.  COMPARISON:  Head CT 05/08/2014 and MRI 07/17/2008  FINDINGS: There is a 5 x 2 cm acute infarct in the posterior medial right parietal lobe slightly extending into the anterior parietal lobe. There is also a small focus of increased diffusion-weighted signal involving history lateral right parietal cortex. Punctate focus of susceptibility artifact at the posterior aspect of the sylvian fissure in the left temporoparietal region may represent an old microhemorrhage or be vascular. Otherwise, there is no evidence of intracranial hemorrhage, mass, midline shift, or extra-axial fluid collection. Small, old infarcts are again seen in the right cerebellum and left centrum semiovale. Lacunar infarct in the left thalamus is new from the prior MRI but a chronic. Patchy and confluent T2 hyperintensities in the subcortical and deep cerebral white matter have progressed from the prior MRI and are nonspecific but compatible with mild to moderate chronic small vessel ischemic disease. There is moderate generalized cerebral atrophy.  Prior bilateral cataract  extraction is noted. Minimal left anterior ethmoid air cell mucosal thickening is present. There are large right and moderate left mastoid effusions. Major intracranial vascular flow voids are preserved.  IMPRESSION: 1. Small to moderate-sized acute right frontoparietal infarct, which may reflect the posterior ACA territory. 2. Small focus acute to subacute cortical infarction in the right  parietal lobe in the MCA territory. 3. Mild-to-moderate chronic small vessel ischemic disease, progressed from prior MRI. Chronic lacunar infarcts as above.   Electronically Signed   By: Logan Bores   On: 05/08/2014 20:13         Subjective: Patient denies headache, fevers, chills, chest pain, shortness breath, vomiting, diarrhea, abdominal pain.  Objective: Filed Vitals:   05/09/14 0622 05/09/14 0800 05/09/14 1153 05/09/14 1437  BP: 153/46   145/69  Pulse: 97   100  Temp: 98.6 F (37 C)   98.5 F (36.9 C)  TempSrc: Axillary   Oral  Resp: 20 18 16 18   Height:      Weight:      SpO2: 100%   100%    Intake/Output Summary (Last 24 hours) at 05/09/14 1607 Last data filed at 05/09/14 0700  Gross per 24 hour  Intake      0 ml  Output      0 ml  Net      0 ml   Weight change:  Exam:   General:  Pt is alert, follows commands appropriately, not in acute distress  HEENT: No icterus, No thrush,  [place]/AT  Cardiovascular: RRR, S1/S2, no rubs, no gallops  Respiratory: bibasilar crackles, no wheeze  Abdomen: Soft/+BS, non tender, non distended, no guarding  Extremities: 1+ LE edema, No lymphangitis, No petechiae, No rashes, no synovitis  Data Reviewed: Basic Metabolic Panel:  Recent Labs Lab 05/05/14 0528 05/06/14 0507 05/07/14 0530 05/08/14 0545 05/09/14 0442  NA 135* 139 137 137 136*  K 4.0 3.8 3.9 3.9 3.7  CL 98 103 102 102 100  CO2 25 24 25 26 28   GLUCOSE 152* 168* 127* 122* 109*  BUN 23 18 23 19 13   CREATININE 0.61 0.64 0.66 0.48* 0.41*  CALCIUM 8.8 8.0* 8.2* 8.0* 8.0*   Liver Function Tests:  Recent Labs Lab 05/04/14 1618 05/05/14 0528  AST 17 17  ALT 10 10  ALKPHOS 81 66  BILITOT 0.3 0.3  PROT 7.9 6.8  ALBUMIN 3.7 3.1*   No results found for this basename: LIPASE, AMYLASE,  in the last 168 hours No results found for this basename: AMMONIA,  in the last 168 hours CBC:  Recent Labs Lab 05/04/14 1618 05/05/14 0528 05/06/14 0507  05/07/14 0530 05/08/14 0545 05/09/14 0442  WBC 13.3* 11.4* 12.5* 9.9 9.2 7.3  NEUTROABS 11.8*  --   --   --   --   --   HGB 12.3 10.0* 8.4* 7.6* 10.0* 9.7*  HCT 36.7 29.2* 25.1* 23.4* 29.3* 29.0*  MCV 88.9 86.9 90.0 91.4 88.0 90.3  PLT 225 213 158 170 155 180   Cardiac Enzymes: No results found for this basename: CKTOTAL, CKMB, CKMBINDEX, TROPONINI,  in the last 168 hours BNP: No components found with this basename: POCBNP,  CBG:  Recent Labs Lab 05/08/14 1606  GLUCAP 122*    Recent Results (from the past 240 hour(s))  MRSA PCR SCREENING     Status: None   Collection Time    05/05/14  4:15 PM      Result Value Ref Range Status   MRSA by  PCR NEGATIVE  NEGATIVE Final   Comment:            The GeneXpert MRSA Assay (FDA     approved for NASAL specimens     only), is one component of a     comprehensive MRSA colonization     surveillance program. It is not     intended to diagnose MRSA     infection nor to guide or     monitor treatment for     MRSA infections.  URINE CULTURE     Status: None   Collection Time    05/06/14  7:41 PM      Result Value Ref Range Status   Specimen Description URINE, CATHETERIZED   Final   Special Requests NONE   Final   Culture  Setup Time     Final   Value: 05/07/2014 01:00     Performed at [facility] Count     Final   Value: NO GROWTH     Performed at [facility]   Culture     Final   Value: NO GROWTH     Performed at [facility]   Report Status 05/08/2014 FINAL   Final     Scheduled Meds: . [START ON 05/10/2014] aspirin  325 mg Oral Daily  . bisacodyl  10 mg Rectal Once  . cycloSPORINE  1 drop Both Eyes BID  . docusate sodium  100 mg Oral Daily  . donepezil  10 mg Oral Q supper  . enoxaparin (LOVENOX) injection  40 mg Subcutaneous Q24H  . feeding supplement (ENSURE COMPLETE)  237 mL Oral BID BM  . hydrocortisone cream   Topical BID  . latanoprost  1 drop Both Eyes QHS  . methenamine  1 g  Oral Q supper  . mirabegron ER  50 mg Oral Daily  . polyethylene glycol  17 g Oral BID   Continuous Infusions: . sodium chloride 0.9 % 1,000 mL with potassium chloride 10 mEq infusion 50 mL/hr at 05/08/14 2100     Ashlin Hidalgo, DO  [facility] Pager [phone]  If 7PM-7AM, please contact night-coverage [email or web address] Password [email or web address] 05/09/2014, 4:07 PM   LOS: 5 days

## 2014-05-09 NOTE — Evaluation (Signed)
Clinical/Bedside Swallow Evaluation Patient Details  Name: [name] MRN: [id number] Date of Birth: [birth year]  Today's Date: 05/09/2014 Time: 0814-4818 SLP Time Calculation (min): 21 min  Past Medical History:  Past Medical History  Diagnosis Date  . Anemia   . Hyperlipidemia   . Hypertension   . Seizure disorder     poss from hponatremia when had pneumonia  . Recurrent UTI   . Urinary incontinence     mild  . Osteoporosis     Grade II spondylotistesis at l4-l5 testing in [place] 2008 and confirmed that she has osteoporosis  . Spondylolisthesis of lumbar region     Grade II spondylotistesis at l4-l5 testing in [place] 2008   . Contusion of leg 08/06/2011  . Hematoma 08/06/2011    leg near shin no bov underlying bony abnormality and no infection. should take a while to resolve observe for infection   . Chest wall pain     "off and on" (05/26/2013)  . COPD (chronic obstructive pulmonary disease)   . Pleural effusion     "a few times" (05/26/2013)  . Seizures ~ 2009    "after pneumonia; electrolytes were all messed up" (05/26/2013)  . Pneumonia 09/2002; ~ 2009;   . Arthritis   . Osteoarthritis   . Thoracic compression fracture   . Thoracic back pain   . Skin cancer 2012    leg   . Dementia    Past Surgical History:  Past Surgical History  Procedure Laterality Date  . Abdominal hysterectomy    . Lumbar disc surgery    . Breast lumpectomy Bilateral      had fibrofatty tissue  . Cataract extraction w/ intraocular lens implant Left   . Glaucoma surgery Left   . Eye surgery    . Skin cancer excision  2012  . Femur im nail Left 05/05/2014    Procedure: INTRAMEDULLARY (IM) NAIL FEMORAL;  Surgeon: [name], MD;  Location: [facility];  Service: Orthopedics;  Laterality: Left;   HPI:  80 y.o. PMH:  dementia, HLD, osteoporosis, urinary incontinence, COPD, seizure disorder recurrent UTI, pna 05/2013 admitted after fall at home.   Underwent left open reduction and internal fixation of  a left peritrochanteric comminuted proximal femur fracture 8/31.  MRI revealed small to moderate-sized acute right frontoparietal infarct, small focus acute to subacute cortical infarction in the right parietal lobe in the MCA territory.  CXR 8/31 no acute findings.  MBS 01/2014 with mild oral dysphagia, flash penetration with straw with recommendation Dys 3, thin.   Assessment / Plan / Recommendation Clinical Impression  Pt. exhibits pharyngeal dysphagia with any consistency thinner that puree/pudding evidenced by cough and immediate and delayed throat clear.  Puree consistency appeared safer without overt s/s aspiration.  Results discussed with MD and initiated Dys 1 and pudding thick liquids (nothing thinner than puree).  Daughter arrived as documenting and SLP educated/answered questions.  MBS hopefully tomorrow if able or Monday morning.    Aspiration Risk   (mod-severe)    Diet Recommendation Dysphagia 1 (Puree);Pudding-thick liquid   Liquid Administration via: Spoon Medication Administration: Crushed with puree Supervision: Staff to assist with self feeding;Full supervision/cueing for compensatory strategies Compensations: Slow rate;Small sips/bites;Check for pocketing Postural Changes and/or Swallow Maneuvers: Seated upright 90 degrees;Upright 30-60 min after meal    Other  Recommendations Recommended Consults: MBS Oral Care Recommendations: Oral care BID   Follow Up Recommendations   (TBD)    Frequency and Duration min 2x/week  2  weeks   Pertinent Vitals/Pain WDL         Swallow Study         Oral/Motor/Sensory Function Overall Oral Motor/Sensory Function: Appears within functional limits for tasks assessed   Ice Chips Ice chips: Within functional limits Presentation: Spoon   Thin Liquid Thin Liquid: Impaired Presentation: Cup Pharyngeal  Phase Impairments: Cough - Immediate;Cough - Delayed;Suspected delayed Swallow;Decreased hyoid-laryngeal movement    Nectar Thick  Nectar Thick Liquid: Impaired Presentation: Cup Pharyngeal Phase Impairments: Suspected delayed Swallow;Decreased hyoid-laryngeal movement;Throat Clearing - Delayed;Cough - Immediate   Honey Thick Honey Thick Liquid: Impaired Presentation: Cup Pharyngeal Phase Impairments: Suspected delayed Swallow;Decreased hyoid-laryngeal movement;Throat Clearing - Delayed;Throat Clearing - Immediate   Puree Puree: Impaired Presentation: Spoon Pharyngeal Phase Impairments: Suspected delayed Swallow;Decreased hyoid-laryngeal movement   Solid   GO    Solid: Not tested       Houston Siren 05/09/2014,3:35 PM Orbie Pyo Colvin Caroli.Ed [employer] [phone]

## 2014-05-09 NOTE — Progress Notes (Addendum)
Subjective: No significant changes  Exam: Filed Vitals:   05/09/14 1437  BP: 145/69  Pulse: 100  Temp: 98.5 F (36.9 C)  Resp: 18   Gen: In bed, NAD MS: Awake, alert, interactive and appropriate, denies problems of the left side CN: Pupils equal round and reactive to light, extraocular movements intact Motor: 5/5 on the right, able to wiggle her thumb on the left, withdraws left leg to noxious stimuli but no voluntary movement Sensory: Does not extinguish to simultaneous stimulation   Impression: 80 year old female with right parietal infarct. I discussed surgery with the daughter who states that she would not want surgery even if needed and therefore carotid Dopplers are not needed. Anticoagulation may be reasonable as I suspect she will no longer be a fall risk after this event as she will not be walking much.   Recommendations: 1) LDL ok 2) echo pending 3) No afib on monitor 4) continue asa  Roland Rack, MD [facility] [phone]  If 7pm- 7am, please page neurology on call as listed in [place].   Echo with torn MV chord. If she does have atrial fibrillation, then Coumadin would be needed, and I am not certain that she would be a good candidate for anticoagulation given that she has significant dementia as well as neglect. She cannot be told to not get out of bed nor can she be relied on to note that she cannot move her left side to get out of bed. Therefore she is a significant fall risk. She is found to have atrial fibrillation the future, however, then anticoagulation could be considered at that time.  At this time, there are no further recommendations from a neurological standpoint. Please call with any further questions or concerns  Roland Rack, MD [facility] [phone]  If 7pm- 7am, please page neurology on call as listed in [place].

## 2014-05-09 NOTE — Progress Notes (Signed)
     Subjective: 4 Days Post-Op Procedure(s) (LRB): INTRAMEDULLARY (IM) NAIL FEMORAL (Left)   Patient expresses minimal pain. She does have drooping of the left side of her face. Talked with daughter who is trying to figure out what to do with the patient in regards to caring for her mother. She is alsready caring for a sister with multiple medical conditions.  Objective:   VITALS:   Filed Vitals:   05/09/14  BP: 153/46  Pulse: 97  Temp: 98.6 F (37 C)   Resp: 18    Incision: scant drainage No cellulitis present Compartment soft  LABS  Recent Labs  05/07/14 0530 05/08/14 0545 05/09/14 0442  HGB 7.6* 10.0* 9.7*  HCT 23.4* 29.3* 29.0*  WBC 9.9 9.2 7.3  PLT 170 155 180     Recent Labs  05/07/14 0530 05/08/14 0545 05/09/14 0442  NA 137 137 136*  K 3.9 3.9 3.7  BUN 23 19 13   CREATININE 0.66 0.48* 0.41*  GLUCOSE 127* 122* 109*     Assessment/Plan: 4 Days Post-Op Procedure(s) (LRB): INTRAMEDULLARY (IM) NAIL FEMORAL (Left)   Dressing to be changed today, have discussed with the nurse.  This can be done when the patient already has to be moved so it can all be done once. Nurse knows to change with a long Meprilex dressing.  Would probably benefit from SNF placement once medicine is able to care for the CVA.    [name]   PAC  05/09/2014, 11:31 AM

## 2014-05-10 DIAGNOSIS — R7309 Other abnormal glucose: Secondary | ICD-10-CM

## 2014-05-10 LAB — BASIC METABOLIC PANEL
Anion gap: 8 (ref 5–15)
BUN: 14 mg/dL (ref 6–23)
CALCIUM: 8.1 mg/dL — AB (ref 8.4–10.5)
CO2: 29 mEq/L (ref 19–32)
Chloride: 102 mEq/L (ref 96–112)
Creatinine, Ser: 0.47 mg/dL — ABNORMAL LOW (ref 0.50–1.10)
GFR calc Af Amer: >90 mL/min (ref >90)
GFR calc non Af Amer: 83 mL/min — ABNORMAL LOW (ref >90)
GLUCOSE: 115 mg/dL — AB (ref 70–99)
Potassium: 3.9 mEq/L (ref 3.7–5.3)
Sodium: 139 mEq/L (ref 137–147)

## 2014-05-10 LAB — CBC
HCT: 29.7 % — ABNORMAL LOW (ref 36.0–46.0)
Hemoglobin: 9.8 g/dL — ABNORMAL LOW (ref 12.0–15.0)
MCH: 30 pg (ref 26.0–34.0)
MCHC: 33 g/dL (ref 30.0–36.0)
MCV: 90.8 fL (ref 78.0–100.0)
Platelets: 203 10*3/uL (ref 150–400)
RBC: 3.27 MIL/uL — ABNORMAL LOW (ref 3.87–5.11)
RDW: 13.3 % (ref 11.5–15.5)
WBC: 8.2 10*3/uL (ref 4.0–10.5)

## 2014-05-10 NOTE — Progress Notes (Signed)
PROGRESS NOTE  [name] EQA:[id number] DOB: Jul 05, 1919 DOA: 05/04/2014 PCP: Holly Dawson, MD  Assessment/Plan:  Left comminuted femur fracture  - ORIF by Dr. [name] 05/05/14  - IV morphine caused increased somnolence-->d/ced  -pain controlled with prn norco  - PT-->recommends SNF  - Lovenox 40mg  [place] daily through 05/19/14  - norco or tramadol for pain  -50% WB on left leg per ortho  Acute nonhemorrhagic stroke  -MR Brain--Small to moderate-sized acute right frontoparietal infarct; Small focus acute to subacute cortical infarction in the right  parietal lobe  -case discussed with Dr. [name]  -LNLG--XQ11-94%, grade 2 diastolic dysfx, moderate to severe MR with possible torn MV cord  -discussed with son and daughter--they do not want any surgery and favor tx with antiplatelet agents only, not anticoagulation  -As a result, will not pursue any further workup or pursue TEE  -Continue ASA 325mg  daily  -d/c tele--present tele for past 24 hours without any Afib  Dysphagia  -pudding thicken liquids  -MBS ordered  Acute Blood Loss Anemia  -partly dilutional  -Transfuse 2 units PRBC on 05/07/14  -Hgb 10.0 post transfusion  Recurrent UTIs on suppressive therapy with Hiprex and myrbetriq  - Urinalysis currently without evidence of infection  - Minimize use of foley catheter-->d/c foley 05/06/14  -05/06/14 repeat UA without pyuria  Tachycardia  -likely reactive from pain, stress, anemia  -Patient's HR overall improved after blood transfusion and IV fluids  -no sob or chest pain  -TSH 3.230  -transfused 2 units PRBC on 09/10/38  Chronic diastolic heart failure, euvolemic  - Lasix three times weekly on hold  - Resume lasix after d/c starting on 05/11/14 if po intake improves  Dermatitis,  -continue steroid cream  Osteoporosis - on outpatient tx  Dementia  -- continue home medications  - Minimize sedating medications--more somnolent after receiving morphine  -tramadol and  norco for pain  -d/c IV morphine  Dry eyes,  -stable, continue eye drops  -continue systane  Leukocytosis, likely reactive from fracture  stress demargination  -improved  - CXR and UA unremarkable  Constipation  -bisacodyl supp  Family Communication: Son and daughter updated on 05/09/14.  Disposition Plan: SNF when medically stable       Procedures/Studies: Dg Chest 1 View  05/04/2014   CLINICAL DATA:  Preoperative films history of hypertension and COPD 8  EXAM: CHEST - 1 VIEW  COMPARISON:  02/02/2014  FINDINGS: Multilevel kyphoplasty noted. Heart size upper normal. Vascular pattern normal. Calcification and mild prominence of the aortic arch stable. Lungs clear. Mitral annulus calcification noted.  IMPRESSION: No acute findings   Electronically Signed   By: Skipper Cliche M.D.   On: 05/04/2014 16:48   Dg Hip Complete Left  05/04/2014   CLINICAL DATA:  Trauma and pain.  EXAM: LEFT HIP - COMPLETE 2+ VIEW  COMPARISON:  Pelvic MRI of 07/30/2013.  FINDINGS: Moderate osteopenia. Vascular calcifications. Femoral heads are located. Comminuted intertrochanteric left femur fracture. Mild varus angulation.  IMPRESSION: Comminuted left proximal femur fracture.   Electronically Signed   By: [name] M.D.   On: 05/04/2014 16:06   Dg Hip Operative Left  05/05/2014   CLINICAL DATA:  ORIF LEFT hip  EXAM: OPERATIVE LEFT HIP  COMPARISON:  Three digital C-arm fluoroscopic images obtained intraoperatively are compared to LEFT hip radiographs of 05/04/2014  FINDINGS: IM nail with compression screw across reduced intertrochanteric fracture of LEFT femur.  Bones appear demineralized.  Distal locking screw present.  No additional fracture or dislocation seen.  IMPRESSION: Post ORIF of intertrochanteric fracture LEFT femur.   Electronically Signed   By: [name] M.D.   On: 05/05/2014 18:57   Dg Ankle Complete Left  05/04/2014   CLINICAL DATA:  Fall.  Left ankle pain.  EXAM: LEFT ANKLE COMPLETE - 3+ VIEW   COMPARISON:  None.  FINDINGS: Bony demineralization. Having material obscures the lateral ankle and lateral part of the proximal foot. Reportedly these were the best obtainable images, but sensitivity for injury is reduced.  Atherosclerosis noted. The lower calcaneus and cuboid are excluded on the lateral projection. No well-defined ankle fracture.  IMPRESSION: 1. Reduced sensitivity due to artifact and difficulty with positioning. No fracture observed. 2. Bony demineralization.   Electronically Signed   By: [name] M.D.   On: 05/04/2014 16:10   Ct Head Wo Contrast  05/08/2014   CLINICAL DATA:  Altered mental status and weakness.  Code stroke.  EXAM: CT HEAD WITHOUT CONTRAST  TECHNIQUE: Contiguous axial images were obtained from the base of the skull through the vertex without intravenous contrast.  COMPARISON:  01/27/2014 head CT.  FINDINGS: There is new low-density medially within the right parietal lobe on images 22-28. Chronic small vessel ischemic changes in the periventricular white matter bilaterally are stable. There is a stable small lacunar infarct inferiorly in the right cerebellum. There is stable mild generalized atrophy. There is no evidence of acute intracranial hemorrhage, mass lesion or extra-axial fluid collection. There is no hydrocephalus.  The visualized paranasal sinuses are clear. There is a persistent right-sided mastoid effusion which appears slightly worse, but without associated coalescence. The mastoid air cells on the left and middle ears are clear bilaterally. No acute calvarial findings are seen.  IMPRESSION: New subacute stroke medially in the right parietal lobe, likely watershed distribution between the ACA and MCA. No evidence of acute intracranial hemorrhage.  Otherwise stable chronic small vessel ischemic changes as described. Mildly progressive right mastoid effusion.  These results were called by telephone at the time of interpretation on 05/08/2014 at 4:39 pm to the  patient's nurse, Joy, who verbally acknowledged these results.   Electronically Signed   By: [name] M.D.   On: 05/08/2014 16:40   Mr Brain Wo Contrast  05/08/2014   CLINICAL DATA:  Left hemiplegia.  EXAM: MRI HEAD WITHOUT CONTRAST  TECHNIQUE: Multiplanar, multiecho pulse sequences of the brain and surrounding structures were obtained without intravenous contrast.  COMPARISON:  Head CT 05/08/2014 and MRI 07/17/2008  FINDINGS: There is a 5 x 2 cm acute infarct in the posterior medial right parietal lobe slightly extending into the anterior parietal lobe. There is also a small focus of increased diffusion-weighted signal involving history lateral right parietal cortex. Punctate focus of susceptibility artifact at the posterior aspect of the sylvian fissure in the left temporoparietal region may represent an old microhemorrhage or be vascular. Otherwise, there is no evidence of intracranial hemorrhage, mass, midline shift, or extra-axial fluid collection. Small, old infarcts are again seen in the right cerebellum and left centrum semiovale. Lacunar infarct in the left thalamus is new from the prior MRI but a chronic. Patchy and confluent T2 hyperintensities in the subcortical and deep cerebral white matter have progressed from the prior MRI and are nonspecific but compatible with mild to moderate chronic small vessel ischemic disease. There is moderate generalized cerebral atrophy.  Prior bilateral cataract extraction is noted. Minimal left anterior ethmoid air cell  mucosal thickening is present. There are large right and moderate left mastoid effusions. Major intracranial vascular flow voids are preserved.  IMPRESSION: 1. Small to moderate-sized acute right frontoparietal infarct, which may reflect the posterior ACA territory. 2. Small focus acute to subacute cortical infarction in the right parietal lobe in the MCA territory. 3. Mild-to-moderate chronic small vessel ischemic disease, progressed from prior MRI.  Chronic lacunar infarcts as above.   Electronically Signed   By: Logan Bores   On: 05/08/2014 20:13         Subjective: Patient is alert and pleasantly confused. She is eating better today. Denies any fevers, chest pain, shortness of breath, abdominal pain, vomiting, diarrhea. She complains of some left hip pain.  Objective: Filed Vitals:   05/09/14 2000 05/09/14 2122 05/10/14 0703 05/10/14 1300  BP:  129/41 145/42 107/34  Pulse:  105 98 98  Temp:  99.6 F (37.6 C) 97.6 F (36.4 C) 98.3 F (36.8 C)  TempSrc:  Axillary Oral Oral  Resp: 18 20 20 18   Height:      Weight:      SpO2: 98% 100% 97%     Intake/Output Summary (Last 24 hours) at 05/10/14 1722 Last data filed at 05/10/14 1500  Gross per 24 hour  Intake   2100 ml  Output    200 ml  Net   1900 ml   Weight change:  Exam:   General:  Pt is alert, follows commands appropriately, not in acute distress  HEENT: No icterus, No thrush, /AT  Cardiovascular: RRR, S1/S2, no rubs, no gallops  Respiratory: Fine bibasilar crackles. No wheezing. Good air movement.  Abdomen: Soft/+BS, non tender, non distended, no guarding  Extremities: trace LE edema, No lymphangitis, No petechiae, No rashes, no synovitis  Data Reviewed: Basic Metabolic Panel:  Recent Labs Lab 05/06/14 0507 05/07/14 0530 05/08/14 0545 05/09/14 0442 05/10/14 0524  NA 139 137 137 136* 139  K 3.8 3.9 3.9 3.7 3.9  CL 103 102 102 100 102  CO2 24 25 26 28 29   GLUCOSE 168* 127* 122* 109* 115*  BUN 18 23 19 13 14   CREATININE 0.64 0.66 0.48* 0.41* 0.47*  CALCIUM 8.0* 8.2* 8.0* 8.0* 8.1*   Liver Function Tests:  Recent Labs Lab 05/04/14 1618 05/05/14 0528  AST 17 17  ALT 10 10  ALKPHOS 81 66  BILITOT 0.3 0.3  PROT 7.9 6.8  ALBUMIN 3.7 3.1*   No results found for this basename: LIPASE, AMYLASE,  in the last 168 hours No results found for this basename: AMMONIA,  in the last 168 hours CBC:  Recent Labs Lab 05/04/14 1618   05/06/14 0507 05/07/14 0530 05/08/14 0545 05/09/14 0442 05/10/14 0524  WBC 13.3*  < > 12.5* 9.9 9.2 7.3 8.2  NEUTROABS 11.8*  --   --   --   --   --   --   HGB 12.3  < > 8.4* 7.6* 10.0* 9.7* 9.8*  HCT 36.7  < > 25.1* 23.4* 29.3* 29.0* 29.7*  MCV 88.9  < > 90.0 91.4 88.0 90.3 90.8  PLT 225  < > 158 170 155 180 203  < > = values in this interval not displayed. Cardiac Enzymes: No results found for this basename: CKTOTAL, CKMB, CKMBINDEX, TROPONINI,  in the last 168 hours BNP: No components found with this basename: POCBNP,  CBG:  Recent Labs Lab 05/08/14 1606  GLUCAP 122*    Recent Results (from the past 240 hour(s))  MRSA  PCR SCREENING     Status: None   Collection Time    05/05/14  4:15 PM      Result Value Ref Range Status   MRSA by PCR NEGATIVE  NEGATIVE Final   Comment:            The GeneXpert MRSA Assay (FDA     approved for NASAL specimens     only), is one component of a     comprehensive MRSA colonization     surveillance program. It is not     intended to diagnose MRSA     infection nor to guide or     monitor treatment for     MRSA infections.  URINE CULTURE     Status: None   Collection Time    05/06/14  7:41 PM      Result Value Ref Range Status   Specimen Description URINE, CATHETERIZED   Final   Special Requests NONE   Final   Culture  Setup Time     Final   Value: 05/07/2014 01:00     Performed at [facility] Count     Final   Value: NO GROWTH     Performed at [facility]   Culture     Final   Value: NO GROWTH     Performed at [facility]   Report Status 05/08/2014 FINAL   Final     Scheduled Meds: . aspirin  325 mg Oral Daily  . cycloSPORINE  1 drop Both Eyes BID  . docusate sodium  100 mg Oral Daily  . donepezil  10 mg Oral Q supper  . enoxaparin (LOVENOX) injection  40 mg Subcutaneous Q24H  . feeding supplement (ENSURE COMPLETE)  237 mL Oral BID BM  . hydrocortisone cream   Topical BID  .  latanoprost  1 drop Both Eyes QHS  . methenamine  1 g Oral Q supper  . mirabegron ER  50 mg Oral Daily  . polyethylene glycol  17 g Oral BID   Continuous Infusions: . sodium chloride 0.9 % 1,000 mL with potassium chloride 10 mEq infusion 50 mL/hr at 05/10/14 1317     Kazumi Lachney, DO  [facility] Pager [phone]  If 7PM-7AM, please contact night-coverage [email or web address] Password [email or web address] 05/10/2014, 5:22 PM   LOS: 6 days

## 2014-05-11 ENCOUNTER — Inpatient Hospital Stay (HOSPITAL_COMMUNITY): Payer: Medicare Other

## 2014-05-11 LAB — CBC
HEMATOCRIT: 30.6 % — AB (ref 36.0–46.0)
Hemoglobin: 10 g/dL — ABNORMAL LOW (ref 12.0–15.0)
MCH: 29.9 pg (ref 26.0–34.0)
MCHC: 32.7 g/dL (ref 30.0–36.0)
MCV: 91.6 fL (ref 78.0–100.0)
Platelets: 243 10*3/uL (ref 150–400)
RBC: 3.34 MIL/uL — ABNORMAL LOW (ref 3.87–5.11)
RDW: 13.5 % (ref 11.5–15.5)
WBC: 8.4 10*3/uL (ref 4.0–10.5)

## 2014-05-11 LAB — BASIC METABOLIC PANEL
ANION GAP: 8 (ref 5–15)
BUN: 17 mg/dL (ref 6–23)
CALCIUM: 8.1 mg/dL — AB (ref 8.4–10.5)
CO2: 28 meq/L (ref 19–32)
Chloride: 103 mEq/L (ref 96–112)
Creatinine, Ser: 0.46 mg/dL — ABNORMAL LOW (ref 0.50–1.10)
GFR calc non Af Amer: 83 mL/min — ABNORMAL LOW (ref >90)
Glucose, Bld: 113 mg/dL — ABNORMAL HIGH (ref 70–99)
Potassium: 3.8 mEq/L (ref 3.7–5.3)
Sodium: 139 mEq/L (ref 137–147)

## 2014-05-11 MED ORDER — METHOCARBAMOL 500 MG PO TABS: 250.0000 mg | ORAL_TABLET | Freq: Four times a day (QID) | ORAL | Status: DC

## 2014-05-11 MED ORDER — METHOCARBAMOL 500 MG PO TABS
250.0000 mg | ORAL_TABLET | Freq: Three times a day (TID) | ORAL | Status: DC
  Administered 2014-05-11 – 2014-05-12 (×3): 250 mg via ORAL
  Filled 2014-05-11: qty 0.5
  Filled 2014-05-11: qty 1
  Filled 2014-05-11: qty 0.5
  Filled 2014-05-11: qty 1
  Filled 2014-05-11: qty 0.5

## 2014-05-11 MED ORDER — HYDROCODONE-ACETAMINOPHEN 5-325 MG PO TABS
2.0000 | ORAL_TABLET | Freq: Every day | ORAL | Status: DC
  Administered 2014-05-11: 2 via ORAL
  Filled 2014-05-11: qty 2

## 2014-05-11 NOTE — Evaluation (Signed)
Occupational Therapy Evaluation Patient Details Name: [name] MRN: [id number] DOB: 09/24/1918 Today's Date: 05/11/2014    History of Present Illness s/p IM nail L hip; PMHx: dementia, HTN, compression fxs,  COPD.  CVA 9/5 with L side weakness   Clinical Impression   This 80 year old female was admitted for L IM nail for fx and had a CVA while an inpatient.  Pt was overall supervision level prior to admission, and she now needs total A for adls.  Pt was limited by pain at the time of the evaluation.  She will benefit from skilled OT to increase participation with adls, increase sitting tolerance and facilitate LUE movement    Follow Up Recommendations  SNF    Equipment Recommendations   (to be further evaluated)    Recommendations for Other Services       Precautions / Restrictions Precautions Precautions: Fall Restrictions Weight Bearing Restrictions: Yes LLE Weight Bearing: Partial weight bearing      Mobility Bed Mobility Overal bed mobility: Needs Assistance;+2 for physical assistance;+ 2 for safety/equipment          General bed mobility comments: Assist for trunk and LEs to roll for adls  Transfers                  Balance                                            ADL Overall ADL's : Needs assistance/impaired                                       General ADL Comments: Pt was limited by pain at time of eval:  total A for UB adls due to pain.  Pt is able to move RUE and she is R dominant.  Total A x 2 for LB adls:  rolled her with NT to change pad and gown as she was incontinent x urine.       Vision                     Perception     Praxis      Pertinent Vitals/Pain Pain Assessment: Faces Pain Score: 8  Faces Pain Scale: Hurts whole lot Pain Location: L hip Pain Descriptors / Indicators: Spasm (appeared to spasm multiple times) Pain Intervention(s): Patient requesting pain meds-RN  notified;Repositioned     Hand Dominance Right   Extremity/Trunk Assessment Upper Extremity Assessment Upper Extremity Assessment: LUE deficits/detail LUE Deficits / Details: Did not palpate shoulder movement; palpated elbow flexion/ some extension in gravity eliminated plane 2-/5; fingers 3-/5.  Pt had difficulty attending to task--increased pain present.  ? sensation, pt responded that I had touched her during testing, and sometimes I did not.  She could not localize       Cervical / Trunk Assessment Cervical / Trunk Assessment: Kyphotic (neck preference to R)   Communication Communication Communication: No difficulties   Cognition Arousal/Alertness: Awake/alert Behavior During Therapy: WFL for tasks assessed/performed Overall Cognitive Status: History of cognitive impairments - at baseline                     General Comments       Exercises Exercises: Other exercises Other Exercises Other Exercises:  retrograde massage and positioning to LUE.  Daughter is a pediatric PT and is familiar with positioning/retrograde massage Other Exercises: attempted shoulder retraction exercises and turning head to midline   Shoulder Instructions      Home Living Family/patient expects to be discharged to:: Skilled nursing facility Living Arrangements: Children;Non-relatives/Friends                               Additional Comments: caregivers 24/7      Prior Functioning/Environment Level of Independence: Needs assistance  Gait / Transfers Assistance Needed: supervision for gait and transfers wit hRW ADL's / Homemaking Assistance Needed: assist for all homemaking, bathing, supervision for shower transfers        OT Diagnosis: Hemiplegia non-dominant side;Acute pain;Generalized weakness   OT Problem List: Decreased strength;Decreased range of motion;Decreased activity tolerance;Pain;Impaired UE functional use;Impaired sensation   OT Treatment/Interventions:  Self-care/ADL training;Therapeutic exercise;DME and/or AE instruction;Therapeutic activities;Patient/family education;Balance training;Neuromuscular education    OT Goals(Current goals can be found in the care plan section) Acute Rehab OT Goals Patient Stated Goal: does not state/unable OT Goal Formulation: With patient Time For Goal Achievement: 05/25/14 Potential to Achieve Goals: Good ADL Goals Pt Will Perform Grooming: with min assist;bed level Pt Will Perform Upper Body Bathing: with min assist;bed level Pt Will Perform Upper Body Dressing: with max assist;bed level Additional ADL Goal #1: pt will participate with AAROM to LUE x 5 reps fingers through shoulders Additional ADL Goal #2: once positioned, pt will sit eob x 5 minutes with min A in preparation for seated adls  OT Frequency: Min 2X/week   Barriers to D/C:            Co-evaluation              End of Session    Activity Tolerance: Patient limited by pain Patient left: in bed;with call bell/phone within reach;with family/visitor present   Time: 7829-5621 OT Time Calculation (min): 35 min Charges:  OT General Charges $OT Visit: 1 Procedure OT Evaluation $Initial OT Evaluation Tier I: 1 Procedure OT Treatments $Therapeutic Activity: 8-22 mins $Therapeutic Exercise: 8-22 mins G-Codes:    Izabella Marcantel 05-Jun-2014, 4:03 PM  Lesle Chris, OTR/L [phone] 06/23/2014

## 2014-05-11 NOTE — Progress Notes (Signed)
   Subjective: 6 Days Post-Op Procedure(s) (LRB): INTRAMEDULLARY (IM) NAIL FEMORAL (Left) Pain moderate according to daughter Plan is to go Skilled nursing facility after hospital stay.  Objective: Vital signs in last 24 hours: Temp:  [97.6 F (36.4 C)-98.7 F (37.1 C)] 97.6 F (36.4 C) (09/07 0513) Pulse Rate:  [79-103] 79 (09/07 0513) Resp:  [18-20] 20 (09/07 0513) BP: (107-153)/(34-59) 153/55 mmHg (09/07 0513) SpO2:  [95 %-96 %] 95 % (09/07 0513)  Intake/Output from previous day:  Intake/Output Summary (Last 24 hours) at 05/11/14 1007 Last data filed at 05/11/14 0834  Gross per 24 hour  Intake 1276.67 ml  Output    200 ml  Net 1076.67 ml    Intake/Output this shift: Total I/O In: -  Out: 200 [Urine:200]  Labs:  Recent Labs  05/09/14 0442 05/10/14 0524 05/11/14 0441  HGB 9.7* 9.8* 10.0*    Recent Labs  05/10/14 0524 05/11/14 0441  WBC 8.2 8.4  RBC 3.27* 3.34*  HCT 29.7* 30.6*  PLT 203 243    Recent Labs  05/10/14 0524 05/11/14 0441  NA 139 139  K 3.9 3.8  CL 102 103  CO2 29 28  BUN 14 17  CREATININE 0.47* 0.46*  GLUCOSE 115* 113*  CALCIUM 8.1* 8.1*   No results found for this basename: LABPT, INR,  in the last 72 hours  EXAM   Dressing/Incision - clean, dry, no drainage   Past Medical History  Diagnosis Date  . Anemia   . Hyperlipidemia   . Hypertension   . Seizure disorder     poss from hponatremia when had pneumonia  . Recurrent UTI   . Urinary incontinence     mild  . Osteoporosis     Grade II spondylotistesis at l4-l5 testing in ealy 2008 and confirmed that she has osteoporosis  . Spondylolisthesis of lumbar region     Grade II spondylotistesis at l4-l5 testing in e[place] 2008   . Contusion of leg 08/06/2011  . Hematoma 08/06/2011    leg near shin no bov underlying bony abnormality and no infection. should take a while to resolve observe for infection   . Chest wall pain     "off and on" (05/26/2013)  . COPD (chronic  obstructive pulmonary disease)   . Pleural effusion     "a few times" (05/26/2013)  . Seizures ~ 2009    "after pneumonia; electrolytes were all messed up" (05/26/2013)  . Pneumonia 09/2002; ~ 2009;   . Arthritis   . Osteoarthritis   . Thoracic compression fracture   . Thoracic back pain   . Skin cancer 2012    leg   . Dementia     Assessment/Plan: 6 Days Post-Op Procedure(s) (LRB): INTRAMEDULLARY (IM) NAIL FEMORAL (Left) Active Problems:   HYPERLIPIDEMIA   DEMENTIA, MILD   HYPERTENSION   Intertrochanteric fracture of left femur   Femur fracture   Leukocytosis, unspecified   Normocytic anemia   Hyponatremia   Closed comminuted intertrochanteric fracture of proximal end of left femur   Postoperative anemia due to acute blood loss   Acute blood loss anemia   CVA (cerebral infarction)   Acute ischemic stroke   Continue PT   Shafter Jupin V 05/11/2014, 10:07 AM

## 2014-05-11 NOTE — Progress Notes (Signed)
PROGRESS NOTE  [name] IDP:[id number] DOB: [birth year] DOA: 05/04/2014 PCP: [name], MD  Assessment/Plan: Left comminuted femur fracture  - ORIF by Dr. [name] 05/05/14  -pain controlled with prn norco  - PT-->recommends SNF  - Lovenox 40mg  [place] daily through 05/19/14  - norco or tramadol for pain -50% WB on left leg per ortho  -schedule 2 vicodin at 2000 per daughter request -robaxin for muscle spasm Acute nonhemorrhagic stroke  -05/08/14 pt noted to be more somnolent and had L-UE hemiplegia with PT  -05/08/14 CT brain--New subacute stroke medially in the right parietal lobe, likely  watershed distribution between the ACA and MCA. No evidence of acute  intracranial hemorrhage.  -MR Brain--Small to moderate-sized acute right frontoparietal infarct; Small focus acute to subacute cortical infarction in the right  parietal lobe  -case discussed with Dr. [name]  -WERX--[id number]%, grade 2 diastolic dysfx, moderate to severe MR with possible torn MV cord  -discussed with son and daughter--they do not want any surgery and favor tx with antiplatelet agents only, not anticoagulation  -As a result, will not pursue any further workup or pursue TEE  -Continue ASA 325mg  daily  -d/c tele--present tele for past 24 hours without any Afib  Dysphagia  -pudding thicken liquids  -MBS ordered--dysphagia 2 with nectar thickened  Acute Blood Loss Anemia  -partly dilutional  -Transfuse 2 units PRBC on 05/07/14  -Hgb 10.0 post transfusion and remained stable thereafter  Recurrent UTIs on suppressive therapy with Hiprex and myrbetriq  - Urinalysis currently without evidence of infection  - Minimize use of foley catheter-->d/c foley 05/06/14  -05/06/14 repeat UA without pyuria  Tachycardia  -likely reactive from pain, stress, anemia  -Patient's HR overall improved after blood transfusion and IV fluids  -no sob or chest pain  -TSH 3.230  -transfused 2 units PRBC on 05/07/14  Chronic  diastolic heart failure, euvolemic  - Lasix three times weekly on hold  - Resume lasix after d/c starting on 05/11/14 if po intake improves  Dermatitis,  -continue steroid cream  Osteoporosis - on outpatient tx  Dementia  -- continue home medications  - Minimize sedating medications--more somnolent after receiving morphine  -tramadol and norco for pain  -d/c IV morphine  Dry eyes,  -stable, continue eye drops  -continue systane  Leukocytosis, likely reactive from fracture  stress demargination  -improved  - CXR and UA unremarkable  Hyponatremia,  -mild, sodium 135.  -improved with IVF  Poor po intake  -reconsulted nutrition to recommend supplementation  Access: PIV  IVF: off  Proph: [place] lovenox Code Status: DNR     Family Communication:   Daughter updated at beside Disposition Plan:   SNF 05/12/14 if medically stable      Procedures/Studies: Dg Chest 1 View  05/04/2014   CLINICAL DATA:  Preoperative films history of hypertension and COPD 8  EXAM: CHEST - 1 VIEW  COMPARISON:  02/02/2014  FINDINGS: Multilevel kyphoplasty noted. Heart size upper normal. Vascular pattern normal. Calcification and mild prominence of the aortic arch stable. Lungs clear. Mitral annulus calcification noted.  IMPRESSION: No acute findings   Electronically Signed   By: Skipper Cliche M.D.   On: 05/04/2014 16:48   Dg Hip Complete Left  05/04/2014   CLINICAL DATA:  Trauma and pain.  EXAM: LEFT HIP - COMPLETE 2+ VIEW  COMPARISON:  Pelvic MRI of 07/30/2013.  FINDINGS: Moderate osteopenia. Vascular calcifications. Femoral heads are located. Comminuted intertrochanteric left  femur fracture. Mild varus angulation.  IMPRESSION: Comminuted left proximal femur fracture.   Electronically Signed   By: [name] M.D.   On: 05/04/2014 16:06   Dg Hip Operative Left  05/05/2014   CLINICAL DATA:  ORIF LEFT hip  EXAM: OPERATIVE LEFT HIP  COMPARISON:  Three digital C-arm fluoroscopic images obtained intraoperatively  are compared to LEFT hip radiographs of 05/04/2014  FINDINGS: IM nail with compression screw across reduced intertrochanteric fracture of LEFT femur.  Bones appear demineralized.  Distal locking screw present.  No additional fracture or dislocation seen.  IMPRESSION: Post ORIF of intertrochanteric fracture LEFT femur.   Electronically Signed   By: [name] M.D.   On: 05/05/2014 18:57   Dg Ankle Complete Left  05/04/2014   CLINICAL DATA:  Fall.  Left ankle pain.  EXAM: LEFT ANKLE COMPLETE - 3+ VIEW  COMPARISON:  None.  FINDINGS: Bony demineralization. Having material obscures the lateral ankle and lateral part of the proximal foot. Reportedly these were the best obtainable images, but sensitivity for injury is reduced.  Atherosclerosis noted. The lower calcaneus and cuboid are excluded on the lateral projection. No well-defined ankle fracture.  IMPRESSION: 1. Reduced sensitivity due to artifact and difficulty with positioning. No fracture observed. 2. Bony demineralization.   Electronically Signed   By: [name] M.D.   On: 05/04/2014 16:10   Ct Head Wo Contrast  05/08/2014   CLINICAL DATA:  Altered mental status and weakness.  Code stroke.  EXAM: CT HEAD WITHOUT CONTRAST  TECHNIQUE: Contiguous axial images were obtained from the base of the skull through the vertex without intravenous contrast.  COMPARISON:  01/27/2014 head CT.  FINDINGS: There is new low-density medially within the right parietal lobe on images 22-28. Chronic small vessel ischemic changes in the periventricular white matter bilaterally are stable. There is a stable small lacunar infarct inferiorly in the right cerebellum. There is stable mild generalized atrophy. There is no evidence of acute intracranial hemorrhage, mass lesion or extra-axial fluid collection. There is no hydrocephalus.  The visualized paranasal sinuses are clear. There is a persistent right-sided mastoid effusion which appears slightly worse, but without associated  coalescence. The mastoid air cells on the left and middle ears are clear bilaterally. No acute calvarial findings are seen.  IMPRESSION: New subacute stroke medially in the right parietal lobe, likely watershed distribution between the ACA and MCA. No evidence of acute intracranial hemorrhage.  Otherwise stable chronic small vessel ischemic changes as described. Mildly progressive right mastoid effusion.  These results were called by telephone at the time of interpretation on 05/08/2014 at 4:39 pm to the patient's nurse, [name], who verbally acknowledged these results.   Electronically Signed   By: [name] M.D.   On: 05/08/2014 16:40   Mr Brain Wo Contrast  05/08/2014   CLINICAL DATA:  Left hemiplegia.  EXAM: MRI HEAD WITHOUT CONTRAST  TECHNIQUE: Multiplanar, multiecho pulse sequences of the brain and surrounding structures were obtained without intravenous contrast.  COMPARISON:  Head CT 05/08/2014 and MRI 07/17/2008  FINDINGS: There is a 5 x 2 cm acute infarct in the posterior medial right parietal lobe slightly extending into the anterior parietal lobe. There is also a small focus of increased diffusion-weighted signal involving history lateral right parietal cortex. Punctate focus of susceptibility artifact at the posterior aspect of the sylvian fissure in the left temporoparietal region may represent an old microhemorrhage or be vascular. Otherwise, there is no evidence of intracranial hemorrhage, mass, midline  shift, or extra-axial fluid collection. Small, old infarcts are again seen in the right cerebellum and left centrum semiovale. Lacunar infarct in the left thalamus is new from the prior MRI but a chronic. Patchy and confluent T2 hyperintensities in the subcortical and deep cerebral white matter have progressed from the prior MRI and are nonspecific but compatible with mild to moderate chronic small vessel ischemic disease. There is moderate generalized cerebral atrophy.  Prior bilateral cataract  extraction is noted. Minimal left anterior ethmoid air cell mucosal thickening is present. There are large right and moderate left mastoid effusions. Major intracranial vascular flow voids are preserved.  IMPRESSION: 1. Small to moderate-sized acute right frontoparietal infarct, which may reflect the posterior ACA territory. 2. Small focus acute to subacute cortical infarction in the right parietal lobe in the MCA territory. 3. Mild-to-moderate chronic small vessel ischemic disease, progressed from prior MRI. Chronic lacunar infarcts as above.   Electronically Signed   By: Logan Bores   On: 05/08/2014 20:13   Dg Swallowing Func-speech Pathology  05/11/2014   [place] Siren, CCC-SLP     05/11/2014  2:37 PM Objective Swallowing Evaluation: Modified Barium Swallowing Study   Patient Details  Name: KATELYND BLAUVELT MRN: [id number] Date of Birth: 1919/02/26  Today's Date: 05/11/2014 Time: 3818-2993 SLP Time Calculation (min): 35 min  Past Medical History:  Past Medical History  Diagnosis Date  . Anemia   . Hyperlipidemia   . Hypertension   . Seizure disorder     poss from hponatremia when had pneumonia  . Recurrent UTI   . Urinary incontinence     mild  . Osteoporosis     Grade II spondylotistesis at l4-l5 testing in [place] 2008 and  confirmed that she has osteoporosis  . Spondylolisthesis of lumbar region     Grade II spondylotistesis at l4-l5 testing in [place] 2008   . Contusion of leg 08/06/2011  . Hematoma 08/06/2011    leg near shin no bov underlying bony abnormality and no  infection. should take a while to resolve observe for infection   . Chest wall pain     "off and on" (05/26/2013)  . COPD (chronic obstructive pulmonary disease)   . Pleural effusion     "a few times" (05/26/2013)  . Seizures ~ 2009    "after pneumonia; electrolytes were all messed up" (05/26/2013)  . Pneumonia 09/2002; ~ 2009;   . Arthritis   . Osteoarthritis   . Thoracic compression fracture   . Thoracic back pain   . Skin cancer 2012    leg   .  Dementia    Past Surgical History:  Past Surgical History  Procedure Laterality Date  . Abdominal hysterectomy    . Lumbar disc surgery    . Breast lumpectomy Bilateral      had fibrofatty tissue  . Cataract extraction w/ intraocular lens implant Left   . Glaucoma surgery Left   . Eye surgery    . Skin cancer excision  2012  . Femur im nail Left 05/05/2014    Procedure: INTRAMEDULLARY (IM) NAIL FEMORAL;  Surgeon: [name], MD;  Location: [facility];  Service: Orthopedics;   Laterality: Left;   HPI:  80 y.o. PMH:  dementia, HLD, osteoporosis, urinary incontinence,  COPD, seizure disorder recurrent UTI, pna 05/2013 admitted after  fall at home.   Underwent left open reduction and internal  fixation of a left peritrochanteric comminuted proximal femur  fracture 8/31.  MRI revealed small  to moderate-sized acute right  frontoparietal infarct, small focus acute to subacute cortical  infarction in the right parietal lobe in the MCA territory.  CXR  8/31 no acute findings.  MBS 01/2014 with mild oral dysphagia,  flash penetration with straw with recommendation Dys 3, thin.     Assessment / Plan / Recommendation Clinical Impression  Dysphagia Diagnosis: Mild oral phase dysphagia;Moderate  pharyngeal phase dysphagia Clinical impression: Mild oral dysphagia with decreased  manipulation and delayed transit with solid texture.  Moderate  pharyngeal dysphagia comprised of sensory and motor impairments  and possible structural abnormality leading to delayed  initiation, and vallecular residue due to reduced tongue base  retraction.  Barium consistently ascended into pyriform sinuses  from cervical esophagus due to what appeared to be cervical  khyphosis causing inadequate/ineffective pressure to clear  pharynx.  Trace aspiration observed once fluoro turned on  following thin trials.  Towards end of study no significant  difference detected between thin and nectar in regards to  pharyngeal residue.  SLP provided verbal education of  MBS results  and recommendations and answered questions.  SLP recommends Dys 2  diet texture and nectar thick liquids, meds whole in applesauce  if tolerated, 2 swallows, alternate bites/sips, volitional  cough/throat clear and FULL supervision/assist.    Treatment Recommendation  Therapy as outlined in treatment plan below    Diet Recommendation Dysphagia 2 (Fine chop);Nectar-thick liquid   Liquid Administration via: Cup;No straw Medication Administration: Whole meds with puree Supervision: Full supervision/cueing for compensatory  strategies;Patient able to self feed Compensations: Slow rate;Small sips/bites;Multiple dry swallows  after each bite/sip;Follow solids with liquid;Clear throat  intermittently Postural Changes and/or Swallow Maneuvers: Seated upright 90  degrees;Upright 30-60 min after meal    Other  Recommendations Oral Care Recommendations: Oral care BID   Follow Up Recommendations  Skilled Nursing facility    Frequency and Duration min 2x/week  2 weeks   Pertinent Vitals/Pain WDL           Reason for Referral Objectively evaluate swallowing function   Oral Phase Oral Preparation/Oral Phase Oral Phase: Impaired Oral - Solids Oral - Regular: Delayed oral transit;Weak lingual manipulation   Pharyngeal Phase Pharyngeal Phase Pharyngeal Phase: Impaired Pharyngeal - Honey Pharyngeal - Honey Teaspoon: Delayed swallow initiation;Premature  spillage to pyriform sinuses;Pharyngeal residue -  valleculae;Reduced tongue base retraction Pharyngeal - Honey Cup: Pharyngeal residue - valleculae;Reduced  tongue base retraction Pharyngeal - Nectar Pharyngeal - Nectar Teaspoon: Delayed swallow  initiation;Premature spillage to valleculae;Pharyngeal residue -  valleculae;Pharyngeal residue - pyriform sinuses;Reduced  laryngeal elevation;Reduced tongue base retraction Pharyngeal - Nectar Cup: Pharyngeal residue -  valleculae;Pharyngeal residue - pyriform sinuses;Reduced tongue  base retraction;Reduced laryngeal   elevation;Penetration/Aspiration during swallow Penetration/Aspiration details (nectar cup): Material enters  airway, remains ABOVE vocal cords then ejected out (cough) Pharyngeal - Thin Pharyngeal - Thin Teaspoon: Pharyngeal residue -  valleculae;Pharyngeal residue - pyriform sinuses;Reduced  laryngeal elevation;Reduced tongue base  retraction;Penetration/Aspiration during swallow Penetration/Aspiration details (thin teaspoon): Material enters  airway, passes BELOW cords without attempt by patient to eject  out (silent aspiration) (eventual barium observed below cords) Pharyngeal - Solids Pharyngeal - Puree: Delayed swallow initiation;Premature spillage  to valleculae Pharyngeal - Regular: Pharyngeal residue - valleculae;Reduced  tongue base retraction  Cervical Esophageal Phase    GO    Cervical Esophageal Phase Cervical Esophageal Phase: Impaired Cervical Esophageal Phase - Comment Cervical Esophageal Comment:  (barium acsends to pyriforms  consistently, khyphosis ?)  Houston Siren 05/11/2014, 2:37 PM Orbie Pyo Colvin Caroli.Ed CCC-SLP Pager [phone]           Subjective: Patient is pleasantly confused. Denies any chest pain, shortness breath, vomiting, diarrhea, abdominal pain. She is having spasms in her left hip  Objective: Filed Vitals:   05/10/14 1300 05/10/14 2039 05/11/14 0513 05/11/14 1445  BP: 107/34 142/59 153/55 151/56  Pulse: 98 103 79 97  Temp: 98.3 F (36.8 C) 98.7 F (37.1 C) 97.6 F (36.4 C) 98.3 F (36.8 C)  TempSrc: Oral Axillary Axillary Oral  Resp: 18 20 20 18   Height:      Weight:      SpO2:  96% 95% 96%    Intake/Output Summary (Last 24 hours) at 05/11/14 1837 Last data filed at 05/11/14 1700  Gross per 24 hour  Intake   1520 ml  Output    200 ml  Net   1320 ml   Weight change:  Exam:   General:  Pt is alert, follows commands appropriately, not in acute distress  HEENT: No icterus, No thrush,  [place]/AT  Cardiovascular: RRR, S1/S2, no rubs, no  gallops  Respiratory: Fine bibasilar crackles. No wheezing. Good air movement.  Abdomen: Soft/+BS, non tender, non distended, no guarding  Extremities: No edema, No lymphangitis, No petechiae, No rashes, no synovitis  Data Reviewed: Basic Metabolic Panel:  Recent Labs Lab 05/07/14 0530 05/08/14 0545 05/09/14 0442 05/10/14 0524 05/11/14 0441  NA 137 137 136* 139 139  K 3.9 3.9 3.7 3.9 3.8  CL 102 102 100 102 103  CO2 25 26 28 29 28   GLUCOSE 127* 122* 109* 115* 113*  BUN 23 19 13 14 17   CREATININE 0.66 0.48* 0.41* 0.47* 0.46*  CALCIUM 8.2* 8.0* 8.0* 8.1* 8.1*   Liver Function Tests:  Recent Labs Lab 05/05/14 0528  AST 17  ALT 10  ALKPHOS 66  BILITOT 0.3  PROT 6.8  ALBUMIN 3.1*   No results found for this basename: LIPASE, AMYLASE,  in the last 168 hours No results found for this basename: AMMONIA,  in the last 168 hours CBC:  Recent Labs Lab 05/07/14 0530 05/08/14 0545 05/09/14 0442 05/10/14 0524 05/11/14 0441  WBC 9.9 9.2 7.3 8.2 8.4  HGB 7.6* 10.0* 9.7* 9.8* 10.0*  HCT 23.4* 29.3* 29.0* 29.7* 30.6*  MCV 91.4 88.0 90.3 90.8 91.6  PLT 170 155 180 203 243   Cardiac Enzymes: No results found for this basename: CKTOTAL, CKMB, CKMBINDEX, TROPONINI,  in the last 168 hours BNP: No components found with this basename: POCBNP,  CBG:  Recent Labs Lab 05/08/14 1606  GLUCAP 122*    Recent Results (from the past 240 hour(s))  MRSA PCR SCREENING     Status: None   Collection Time    05/05/14  4:15 PM      Result Value Ref Range Status   MRSA by PCR NEGATIVE  NEGATIVE Final   Comment:            The GeneXpert MRSA Assay (FDA     approved for NASAL specimens     only), is one component of a     comprehensive MRSA colonization     surveillance program. It is not     intended to diagnose MRSA     infection nor to guide or     monitor treatment for     MRSA infections.  URINE CULTURE     Status: None   Collection Time  05/06/14  7:41 PM      Result  Value Ref Range Status   Specimen Description URINE, CATHETERIZED   Final   Special Requests NONE   Final   Culture  Setup Time     Final   Value: 05/07/2014 01:00     Performed at [facility] Count     Final   Value: NO GROWTH     Performed at [facility]   Culture     Final   Value: NO GROWTH     Performed at [facility]   Report Status 05/08/2014 FINAL   Final     Scheduled Meds: . aspirin  325 mg Oral Daily  . cycloSPORINE  1 drop Both Eyes BID  . docusate sodium  100 mg Oral Daily  . donepezil  10 mg Oral Q supper  . enoxaparin (LOVENOX) injection  40 mg Subcutaneous Q24H  . feeding supplement (ENSURE COMPLETE)  237 mL Oral BID BM  . HYDROcodone-acetaminophen  2 tablet Oral Q2000  . hydrocortisone cream   Topical BID  . latanoprost  1 drop Both Eyes QHS  . methenamine  1 g Oral Q supper  . methocarbamol  250 mg Oral TID  . mirabegron ER  50 mg Oral Daily  . polyethylene glycol  17 g Oral BID   Continuous Infusions: . sodium chloride 0.9 % 1,000 mL with potassium chloride 10 mEq infusion 50 mL/hr at 05/11/14 2248     Durrell Barajas, DO  [facility] Pager [phone]  If 7PM-7AM, please contact night-coverage [email or web address] Password [email or web address] 05/11/2014, 6:37 PM   LOS: 7 days

## 2014-05-11 NOTE — Progress Notes (Signed)
Physical Therapy Treatment Patient Details Name: [name] MRN: [id number] DOB: June 15, 1919 Today's Date: 05-28-2014    History of Present Illness s/p IM nail L hip; PMHx: dementia, HTN, compression fxs,  COPD.  CVA 9/5 with L side weakness    PT Comments      Follow Up Recommendations  SNF     Equipment Recommendations  None recommended by PT    Recommendations for Other Services OT consult     Precautions / Restrictions Precautions Precautions: Fall Restrictions Weight Bearing Restrictions: Yes LLE Weight Bearing: Partial weight bearing    Mobility  Bed Mobility Overal bed mobility: Needs Assistance;+2 for physical assistance;+ 2 for safety/equipment Bed Mobility: Sit to Supine       Sit to supine: Total assist;+2 for physical assistance   General bed mobility comments: +2 for trunk and LEs  Transfers Overall transfer level: Needs assistance   Transfers: Sit to/from Stand Sit to Stand: +2 physical assistance;Total assist Stand pivot transfers: Total assist;+2 physical assistance          Ambulation/Gait                 Stairs            Wheelchair Mobility    Modified Rankin (Stroke Patients Only)       Balance                                    Cognition Arousal/Alertness: Awake/alert Behavior During Therapy: WFL for tasks assessed/performed Overall Cognitive Status: History of cognitive impairments - at baseline                      Exercises      General Comments        Pertinent Vitals/Pain Pain Assessment: Faces Pain Score: 4  Faces Pain Scale: Hurts little more Pain Location: R arm, R leg, L leg Pain Descriptors / Indicators: Aching;Spasm Pain Intervention(s): Limited activity within patient's tolerance;Monitored during session    Home Living                      Prior Function            PT Goals (current goals can now be found in the care plan section) Acute Rehab PT  Goals Patient Stated Goal: does not state/unable PT Goal Formulation: Patient unable to participate in goal setting Time For Goal Achievement: 05/18/14 Potential to Achieve Goals: Fair Progress towards PT goals: Progressing toward goals    Frequency  Min 3X/week    PT Plan Current plan remains appropriate    Co-evaluation             End of Session Equipment Utilized During Treatment: Gait belt Activity Tolerance: Patient limited by fatigue;Patient limited by pain Patient left: in bed;with call bell/phone within reach;with family/visitor present     Time: 9169-4503 PT Time Calculation (min): 12 min  Charges:  $Therapeutic Activity: 8-22 mins                    G Codes:      Franshesca Chipman 05/28/2014, 1:31 PM

## 2014-05-11 NOTE — Progress Notes (Signed)
OT Cancellation Note  Patient Details Name: [name] MRN: [id number] DOB: Mar 06, 1919   Cancelled Treatment:     OT had signed off on this pt 05/06/14.  PT informed us that pt is now s/p CVA. Will either check back on her later today or tomorrow.  Adra Shepler 05/11/2014, 1:54 PM Lesle Chris, OTR/L [phone] 05/11/2014

## 2014-05-11 NOTE — Progress Notes (Signed)
Clinical Social Work  CSW met with patient and dtr at bedside. Dtr had questions about when patient would DC. CSW encouraged dtr to direct those questions to MD. Dtr reports she has been keeping [facility] updated re: DC plans and knows once MD DC patient then patient will leave that day. CSW will continue to follow.  Sindy Messing, LCSW (Coverage for [employer])

## 2014-05-11 NOTE — Evaluation (Signed)
Physical Therapy Evaluation Patient Details Name: [name] MRN: [id number] DOB: 10-03-1918 Today's Date: 05/11/2014   History of Present Illness  s/p IM nail L hip; PMHx: dementia, HTN, compression fxs,  COPD.  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  Pt currently requires assist of 2 for all mobility and will require follow up rehab at SNF level.    Follow Up Recommendations SNF    Equipment Recommendations  None recommended by PT    Recommendations for Other Services OT consult     Precautions / Restrictions Precautions Precautions: Fall Restrictions Weight Bearing Restrictions: Yes LLE Weight Bearing: Partial weight bearing      Mobility  Bed Mobility Overal bed mobility: Needs Assistance;+2 for physical assistance;+ 2 for safety/equipment Bed Mobility: Supine to Sit     Supine to sit: Max assist;+2 for physical assistance     General bed mobility comments: +2 for trunk and LEs  Transfers Overall transfer level: Needs assistance   Transfers: Sit to/from Stand;Stand Pivot Transfers Sit to Stand: +2 physical assistance;Total assist Stand pivot transfers: Total assist;+2 physical assistance       General transfer comment: Pt initially assisting with move to stand and accepting wt R LE but requiring full assist to complet transfer  Ambulation/Gait                Stairs            Wheelchair Mobility    Modified Rankin (Stroke Patients Only)       Balance Overall balance assessment: Needs assistance Sitting-balance support: Bilateral upper extremity supported;Feet supported Sitting balance-Leahy Scale: Zero   Postural control: Left lateral lean                                   Pertinent Vitals/Pain Pain Assessment: Faces Pain Score: 4   Faces Pain Scale: Hurts little more Pain Location: L LE Pain Intervention(s): Limited activity within patient's tolerance;Monitored during session;Premedicated before session    Home Living Family/patient expects to be discharged to:: Skilled nursing facility Living Arrangements: Children;Non-relatives/Friends               Additional Comments: caregivers 24/7    Prior Function Level of Independence: Needs assistance   Gait / Transfers Assistance Needed: supervision for gait and transfers wit hRW  ADL's / Homemaking Assistance Needed: assist for all homemaking, bathing, supervision for shower transfers        Hand Dominance   Dominant Hand: Right    Extremity/Trunk Assessment   Upper Extremity Assessment: LUE deficits/detail       LUE Deficits / Details: No active movement noted L UE except for very weak hand grip   Lower Extremity Assessment: LLE deficits/detail   LLE Deficits / Details: No arom at L LE except trace ankle DF  Cervical / Trunk Assessment: Kyphotic  Communication   Communication: No difficulties  Cognition Arousal/Alertness: Awake/alert Behavior During Therapy: WFL for tasks assessed/performed Overall Cognitive Status: History of cognitive impairments - at baseline                      General Comments      Exercises General Exercises - Lower Extremity Ankle Circles/Pumps: AROM;AAROM;PROM;Both;15 reps;Supine Heel Slides:  Left;15 reps;Supine;PROM Hip ABduction/ADduction: PROM;Left;10 reps;Supine      Assessment/Plan    PT Assessment Patient needs continued PT services  PT Diagnosis Hemiplegia non-dominant side;Difficulty walking   PT Problem List Decreased strength;Decreased activity tolerance;Decreased balance;Decreased mobility;Decreased knowledge of use of DME  PT Treatment Interventions DME instruction;Functional mobility training;Therapeutic activities;Therapeutic exercise;Balance training;Patient/family education   PT  Goals (Current goals can be found in the Care Plan section) Acute Rehab PT Goals Patient Stated Goal: does not state/unable PT Goal Formulation: Patient unable to participate in goal setting Time For Goal Achievement: 05/18/14 Potential to Achieve Goals: Fair    Frequency Min 3X/week   Barriers to discharge        Co-evaluation               End of Session Equipment Utilized During Treatment: Gait belt Activity Tolerance: Patient tolerated treatment well;Patient limited by fatigue Patient left: in chair;with call bell/phone within reach;with family/visitor present Nurse Communication: Mobility status;Need for lift equipment         Time: 0175-1025 PT Time Calculation (min): 39 min   Charges:   PT Evaluation $PT Re-evaluation: 1 Procedure PT Treatments $Therapeutic Exercise: 8-22 mins $Therapeutic Activity: 23-37 mins   PT G Codes:          Avarey Yaeger 05/11/2014, 1:19 PM

## 2014-05-11 NOTE — Procedures (Signed)
Objective Swallowing Evaluation: Modified Barium Swallowing Study  Patient Details  Name: [name] MRN: [id number] Date of Birth: 05/10/1919  Today's Date: 05/11/2014 Time: 4627-0350 SLP Time Calculation (min): 35 min  Past Medical History:  Past Medical History  Diagnosis Date  . Anemia   . Hyperlipidemia   . Hypertension   . Seizure disorder     poss from hponatremia when had pneumonia  . Recurrent UTI   . Urinary incontinence     mild  . Osteoporosis     Grade II spondylotistesis at l4-l5 testing in [place] 2008 and confirmed that she has osteoporosis  . Spondylolisthesis of lumbar region     Grade II spondylotistesis at l4-l5 testing in [place] 2008   . Contusion of leg 08/06/2011  . Hematoma 08/06/2011    leg near shin no bov underlying bony abnormality and no infection. should take a while to resolve observe for infection   . Chest wall pain     "off and on" (05/26/2013)  . COPD (chronic obstructive pulmonary disease)   . Pleural effusion     "a few times" (05/26/2013)  . Seizures ~ 2009    "after pneumonia; electrolytes were all messed up" (05/26/2013)  . Pneumonia 09/2002; ~ 2009;   . Arthritis   . Osteoarthritis   . Thoracic compression fracture   . Thoracic back pain   . Skin cancer 2012    leg   . Dementia    Past Surgical History:  Past Surgical History  Procedure Laterality Date  . Abdominal hysterectomy    . Lumbar disc surgery    . Breast lumpectomy Bilateral      had fibrofatty tissue  . Cataract extraction w/ intraocular lens implant Left   . Glaucoma surgery Left   . Eye surgery    . Skin cancer excision  2012  . Femur im nail Left 05/05/2014    Procedure: INTRAMEDULLARY (IM) NAIL FEMORAL;  Surgeon: [name], MD;  Location: [facility];  Service: Orthopedics;  Laterality: Left;   HPI:  80 y.o. PMH:  dementia, HLD, osteoporosis, urinary incontinence, COPD, seizure disorder recurrent UTI, pna 05/2013 admitted after fall at home.   Underwent left open  reduction and internal fixation of a left peritrochanteric comminuted proximal femur fracture 8/31.  MRI revealed small to moderate-sized acute right frontoparietal infarct, small focus acute to subacute cortical infarction in the right parietal lobe in the MCA territory.  CXR 8/31 no acute findings.  MBS 01/2014 with mild oral dysphagia, flash penetration with straw with recommendation Dys 3, thin.     Assessment / Plan / Recommendation Clinical Impression  Dysphagia Diagnosis: Mild oral phase dysphagia;Moderate pharyngeal phase dysphagia Clinical impression: Mild oral dysphagia with decreased manipulation and delayed transit with solid texture.  Moderate pharyngeal dysphagia comprised of sensory and motor impairments and possible structural abnormality leading to delayed initiation, and vallecular residue due to reduced tongue base retraction.  Barium consistently ascended into pyriform sinuses from cervical esophagus due to what appeared to be cervical khyphosis causing inadequate/ineffective pressure to clear pharynx.  Trace aspiration observed once fluoro turned on following thin trials.  Towards end of study no significant difference detected between thin and nectar in regards to pharyngeal residue.  SLP provided verbal education of MBS results and recommendations and answered questions.  SLP recommends Dys 2 diet texture and nectar thick liquids, meds whole in applesauce if tolerated, 2 swallows, alternate bites/sips, volitional cough/throat clear and FULL supervision/assist.    Treatment  Recommendation  Therapy as outlined in treatment plan below    Diet Recommendation Dysphagia 2 (Fine chop);Nectar-thick liquid   Liquid Administration via: Cup;No straw Medication Administration: Whole meds with puree Supervision: Full supervision/cueing for compensatory strategies;Patient able to self feed Compensations: Slow rate;Small sips/bites;Multiple dry swallows after each bite/sip;Follow solids with  liquid;Clear throat intermittently Postural Changes and/or Swallow Maneuvers: Seated upright 90 degrees;Upright 30-60 min after meal    Other  Recommendations Oral Care Recommendations: Oral care BID   Follow Up Recommendations  Skilled Nursing facility    Frequency and Duration min 2x/week  2 weeks   Pertinent Vitals/Pain WDL           Reason for Referral Objectively evaluate swallowing function   Oral Phase Oral Preparation/Oral Phase Oral Phase: Impaired Oral - Solids Oral - Regular: Delayed oral transit;Weak lingual manipulation   Pharyngeal Phase Pharyngeal Phase Pharyngeal Phase: Impaired Pharyngeal - Honey Pharyngeal - Honey Teaspoon: Delayed swallow initiation;Premature spillage to pyriform sinuses;Pharyngeal residue - valleculae;Reduced tongue base retraction Pharyngeal - Honey Cup: Pharyngeal residue - valleculae;Reduced tongue base retraction Pharyngeal - Nectar Pharyngeal - Nectar Teaspoon: Delayed swallow initiation;Premature spillage to valleculae;Pharyngeal residue - valleculae;Pharyngeal residue - pyriform sinuses;Reduced laryngeal elevation;Reduced tongue base retraction Pharyngeal - Nectar Cup: Pharyngeal residue - valleculae;Pharyngeal residue - pyriform sinuses;Reduced tongue base retraction;Reduced laryngeal elevation;Penetration/Aspiration during swallow Penetration/Aspiration details (nectar cup): Material enters airway, remains ABOVE vocal cords then ejected out (cough) Pharyngeal - Thin Pharyngeal - Thin Teaspoon: Pharyngeal residue - valleculae;Pharyngeal residue - pyriform sinuses;Reduced laryngeal elevation;Reduced tongue base retraction;Penetration/Aspiration during swallow Penetration/Aspiration details (thin teaspoon): Material enters airway, passes BELOW cords without attempt by patient to eject out (silent aspiration) (eventual barium observed below cords) Pharyngeal - Solids Pharyngeal - Puree: Delayed swallow initiation;Premature spillage to  valleculae Pharyngeal - Regular: Pharyngeal residue - valleculae;Reduced tongue base retraction  Cervical Esophageal Phase    GO    Cervical Esophageal Phase Cervical Esophageal Phase: Impaired Cervical Esophageal Phase - Comment Cervical Esophageal Comment:  (barium acsends to pyriforms consistently, khyphosis ?)         [place] Siren 05/11/2014, 2:37 PM Orbie Pyo Trebor Galdamez M.Ed [employer] [phone]

## 2014-05-12 MED ORDER — HYDROCODONE-ACETAMINOPHEN 5-325 MG PO TABS: 2.0000 | ORAL_TABLET | Freq: Every day | ORAL | Status: AC

## 2014-05-12 MED ORDER — HYDROCORTISONE 1 % EX CREA: TOPICAL_CREAM | Freq: Two times a day (BID) | CUTANEOUS | Status: AC

## 2014-05-12 MED ORDER — RESOURCE THICKENUP CLEAR PO POWD: ORAL | Status: AC

## 2014-05-12 MED ORDER — METHOCARBAMOL 500 MG PO TABS: 500.0000 mg | ORAL_TABLET | Freq: Three times a day (TID) | ORAL | Status: AC

## 2014-05-12 MED ORDER — HYDROCODONE-ACETAMINOPHEN 5-325 MG PO TABS: 1.0000 | ORAL_TABLET | Freq: Four times a day (QID) | ORAL | Status: AC | PRN

## 2014-05-12 MED ORDER — METHOCARBAMOL 500 MG PO TABS
500.0000 mg | ORAL_TABLET | Freq: Three times a day (TID) | ORAL | Status: DC
  Administered 2014-05-12: 500 mg via ORAL
  Filled 2014-05-12: qty 1

## 2014-05-12 MED ORDER — ENOXAPARIN SODIUM 40 MG/0.4ML ~~LOC~~ SOLN: 40.0000 mg | SUBCUTANEOUS | Status: AC

## 2014-05-12 MED ORDER — ASPIRIN 325 MG PO TABS: 325.0000 mg | ORAL_TABLET | Freq: Every day | ORAL | Status: AC

## 2014-05-12 MED ORDER — ENSURE COMPLETE PO LIQD: 237.0000 mL | Freq: Two times a day (BID) | ORAL | Status: AC

## 2014-05-12 NOTE — Progress Notes (Signed)
Patient is set to discharge to [facility] today. Patient & daughter, Patrici Ranks at bedside aware. Discharge packet in [place], Arbie Cookey aware. [facility] called for transport.    Clinical Social Work Department CLINICAL SOCIAL WORK PLACEMENT NOTE 05/12/2014  Patient:  [name], [name]  Account Number:  [id number] Admit date:  05/04/2014  Clinical Social Worker:  Werner Lean, LCSW  Date/time:  05/05/2014 01:14 PM  Clinical Social Work is seeking post-discharge placement for this patient at the following level of care:   SKILLED NURSING   (*CSW will update this form in Epic as items are completed)   05/05/2014  Patient/family provided with [place] Department of Clinical Social Work's list of facilities offering this level of care within the geographic area requested by the patient (or if unable, by the patient's family).  05/05/2014  Patient/family informed of their freedom to choose among providers that offer the needed level of care, that participate in Medicare, Medicaid or managed care program needed by the patient, have an available bed and are willing to accept the patient.    Patient/family informed of [facility]' ownership interest in [facility], as well as of the fact that they are under no obligation to receive care at this facility.  PASARR submitted to EDS on 05/05/2014 PASARR number received on 05/05/2014  FL2 transmitted to all facilities in geographic area requested by pt/family on  05/05/2014 FL2 transmitted to all facilities within larger geographic area on   Patient informed that his/her managed care company has contracts with or will negotiate with  certain facilities, including the following:     Patient/family informed of bed offers received:  05/12/2014 Patient chooses bed at [place] Physician recommends and patient chooses bed at    Patient to be transferred to [facility] on  05/12/2014 Patient  to be transferred to facility by [facility] Patient and family notified of transfer on 05/12/2014 Name of family member notified:  patient's daughter, Patrici Ranks at bedside  The following physician request were entered in Epic:   Additional Comments:   Raynaldo Opitz, [place] Social Worker cell #: [phone]

## 2014-05-12 NOTE — Progress Notes (Signed)
Speech Language Pathology Treatment: Dysphagia  Patient Details Name: [name] MRN: [id number] DOB: Sep 08, 1918 Today's Date: 05/12/2014 Time: 2111-5520 SLP Time Calculation (min): 55 min  Assessment / Plan / Recommendation Clinical Impression  Pt resting in bed. Daughter present. Pt is pending DC today, and RN paged SLP to complete education with pt/family. Daughter reports pt not tolerating dys 2 diet, due to variable mentation. Pt appropriateness for finely chopped diet likely to be highly variable due to advanced dementia. Will downgrade diet to puree/nectar, which will likely be more functional for pt. Anticipate sweet items will be more palatable to pt, due to advanced dementia. Staff/daughter may provide dys 2 consistency items as pt tolerates and/or requests, however, for primary nutrition and hydration dys 1/full liquid/nectar thick is recommended. Daughter was provided with written safe swallow precautions, information on natural and commercial thickeners, and literature regarding trend to return to tolerance of only sweet liquids as dementia progresses. Recommend follow up with speech therapy at next venue for assessment of diet tolerance and continued education of family/staff.      HPI: 80 y.o. PMH:  dementia, HLD, osteoporosis, urinary incontinence, COPD, seizure disorder recurrent UTI, pna 05/2013 admitted after fall at home.   Underwent left open reduction and internal fixation of a left peritrochanteric comminuted proximal femur fracture 8/31.  MRI revealed small to moderate-sized acute right frontoparietal infarct, small focus acute to subacute cortical infarction in the right parietal lobe in the MCA territory.  CXR 8/31 no acute findings.  MBS 01/2014 with mild oral dysphagia, flash penetration with straw with recommendation Dys 3, thin. Repeat MBS 05/11/14 rec dys 2/nectar liquids. Pt to DC today (05/12/14). RN called SLP to complete education with daughter.   Pertinent Vitals Pain  Assessment: No/denies pain  SLP Plan  Continue with current plan of care    Recommendations Diet recommendations: Dysphagia 1 (puree);Nectar-thick liquid Liquids provided via: Cup;No straw Medication Administration: Whole meds with puree Supervision: Full supervision/cueing for compensatory strategies Compensations: Slow rate;Small sips/bites;Multiple dry swallows after each bite/sip;Follow solids with liquid;Clear throat intermittently Postural Changes and/or Swallow Maneuvers: Seated upright 90 degrees;Upright 30-60 min after meal              Oral Care Recommendations: Oral care BID Follow up Recommendations: Skilled Nursing facility Plan: Continue with current plan of care    [place] [facility] [place]  [name], [place] 05/12/2014, 11:31 AM

## 2014-05-12 NOTE — Progress Notes (Signed)
Report called to Lurline Hare LPN @ [facility]

## 2014-05-12 NOTE — Discharge Summary (Addendum)
Physician Discharge Summary  [name] IRW:[id number] DOB: 1919/03/21 DOA: 05/04/2014  PCP: [name], MD  Admit date: 05/04/2014 Discharge date: 05/11/14 Recommendations for Outpatient Follow-up:  1. Pt will need to follow up with PCP in 2 weeks post discharge 2. Please also check CBC 1 week 3. Systane (preservative free)--apply one drop to both eyes every 2 hours.  Please recap after use.  Each individual packet is good for 24hrs--discard and use new packet every 24 hours - Lovenox 40mg  [place] daily through 05/19/14--stop after dose on 05/19/14 - please keep pt on 2L [place] and wean to room air as appropriate  Discharge Diagnoses:  Left comminuted femur fracture  - ORIF by Dr. [name] 05/05/14  -pain controlled with prn norco  - PT-->recommends SNF  - Lovenox 40mg  [place] daily through 05/19/14  -50% WB on left leg per ortho  -schedule 2 vicodin at 2000 per daughter request, then norco one to two tabs q 6 hrs prn pain throughout day -robaxin for muscle spasm Acute nonhemorrhagic stroke  -05/08/14 pt noted to be more somnolent and had L-UE hemiplegia with PT -05/08/14 CT brain--New subacute stroke medially in the right parietal lobe, likely  watershed distribution between the ACA and MCA. No evidence of acute  intracranial hemorrhage. -MR Brain--Small to moderate-sized acute right frontoparietal infarct; Small focus acute to subacute cortical infarction in the right  parietal lobe  -case discussed with Dr. [name]  -PYPP--[id number]%, grade 2 diastolic dysfx, moderate to severe MR with possible torn MV cord  -discussed with son and daughter--they do not want any surgery and favor tx with antiplatelet agents only, not anticoagulation  -As a result, will not pursue any further workup or pursue TEE  -Continue ASA 325mg  daily  -d/c tele--present tele for past 24 hours without any Afib  Dysphagia  -MBS ordered--reviewed and re-evaluated by speech therapy whom on day of d/c recommended dysphagia  1 diet with nectar thickened liquids  Acute Blood Loss Anemia  -partly dilutional  -Transfuse 2 units PRBC on 05/07/14 -Hgb 10.0 post transfusion and remained stable thereafter Recurrent UTIs on suppressive therapy with Hiprex and myrbetriq  - Urinalysis currently without evidence of infection  - Minimize use of foley catheter-->d/c foley 05/06/14 -05/06/14 repeat UA without pyuria Tachycardia  -likely reactive from pain, stress, anemia -Patient's HR overall improved after blood transfusion and IV fluids -no sob or chest pain  -TSH 3.230  -transfused 2 units PRBC on 03/05/23 Chronic diastolic heart failure, euvolemic  - Lasix three times weekly on hold  - Resume lasix after d/c starting on 05/11/14 if po intake improves Dermatitis,  -continue steroid cream  Osteoporosis - on outpatient tx  Dementia  -- continue home medications  - Minimize sedating medications--more somnolent after receiving morphine -tramadol and norco for pain -d/c IV morphine Dry eyes,  -stable, continue eye drops  -continue systane  Leukocytosis, likely reactive from fracture  stress demargination  -improved  - CXR and UA unremarkable   Hyponatremia,  -mild, sodium 135.  -improved with IVF  Poor po intake -reconsulted nutrition to recommend supplementation  Proph: [facility] Lovenox through 05/19/14 Code Status: DNR   DIET--dysphagia 1 with nectar thickened liquids Discharge Condition: stable  Disposition: SNF Follow-up Information   Follow up with Holly Pole, MD In 2 weeks. (For suture removal)    Specialty:  Orthopedic Surgery   Contact information:   [address] [place] [address] [phone]       Diet:soft/dysphagia 3 Wt Readings from  Last 3 Encounters:  05/08/14 63.6 kg (140 lb 3.4 oz)  05/08/14 63.6 kg (140 lb 3.4 oz)  01/28/14 61 kg (134 lb 7.7 oz)    History of present illness:  80 y.o. female has a past medical history significant for moderate to severe dementia,  HLD, osteoporosis, urinary incontinence, COPD, presented with mechanical fall resulting in left femur fracture. Patient has underlying dementia and cannot contribute to the story. Daughter denies recent fever or chills, denies that the patient complained of chest pain, shortness of breath, lightheadedness or dizziness, and apparently she is at baseline.  Orthopedics, Dr. [name], was consulted.  He performed ORIF Left comminuted proximal femur fracture on 05/04/2014. Postoperatively, PT was consulted. They recommended skilled nursing facility. The patient had blood loss anemia after the surgery. She required 2 units PRBCs were transfused on 05/07/2014. The patient did have some increased somnolence with IV opioids. These were discontinued. In addition, the patient also has some tachycardia which is likely reactive due to the patient's acute medical condition as well as her anemia. Her tachycardia improved after blood transfusion and IV fluids.  Unfortunately, the patient was noted to be more somnolent on 05/08/2014. In addition, physical therapy noted that she had left upper extremity hemiplegia. There was concern for stroke. CT of the brain confirmed a right parietal stroke. Neurology was consulted. MR Brain--Small to moderate-sized acute right frontoparietal infarct; Small focus acute to subacute cortical infarction in the right initially, a full stroke workup was undertaken. However, after discussion with the patient's son and daughter whom stated he did not want TPA or anticoagulation or surgery carotid duplex was discontinued.  In addition, echocardiogram showed mild to severe mitral regurgitation with concerns for a mitral valve chordae rupture. However the patient's family did not want any surgery given the patient's comorbidities and age. As a result, a TEE was not pursued. After discussion with the patient's family, they decided that they did not want the patient to be on any anticoagulation. As a result,  the patient was placed on full dose aspirin 325 mg daily. The patient developed dysphagia after the stroke. Speech therapy recommended putting thick liquids. A modified barium swallow was ordered, and with recommendation of speech therapy, the pt will d/c on dysphagia 1 diet with nectar consistency liquids        Consultants: Ortho--Dr. [name]  Discharge Exam: Filed Vitals:   05/12/14 0627  BP: 166/77  Pulse: 107  Temp: 98.2 F (36.8 C)  Resp: 20   Filed Vitals:   05/11/14 0513 05/11/14 1445 05/11/14 2050 05/12/14 0627  BP: 153/55 151/56 130/50 166/77  Pulse: 79 97 100 107  Temp: 97.6 F (36.4 C) 98.3 F (36.8 C) 98.3 F (36.8 C) 98.2 F (36.8 C)  TempSrc: Axillary Oral Oral Oral  Resp: 20 18 18 20   Height:      Weight:      SpO2: 95% 96% 99% 97%   General: Awake and alert, NAD, pleasant, cooperative Cardiovascular: RRR, no rub, no gallop, no S3 Respiratory: Bibasilar crackles. No wheezing. Abdomen:soft, nontender, nondistended, positive bowel sounds Extremities: trace LE edema, No lymphangitis, no petechiae  Discharge Instructions      Discharge Instructions   Diet - low sodium heart healthy    Complete by:  As directed      Increase activity slowly    Complete by:  As directed      Partial weight bearing    Complete by:  As directed   50%  Partial weight bearing    Complete by:  As directed   % Body Weight:  50  Laterality:  left  Extremity:  Lower            Medication List    STOP taking these medications       aspirin EC 81 MG tablet  Replaced by:  aspirin 325 MG tablet      TAKE these medications       acetaminophen 325 MG tablet  Commonly known as:  TYLENOL  Take 2 tablets (650 mg total) by mouth every 6 (six) hours as needed (pain).     aspirin 325 MG tablet  Take 1 tablet (325 mg total) by mouth daily.     Calcium Carb-Cholecalciferol 500-600 MG-UNIT Chew  Chew 1 tablet by mouth 2 (two) times daily.     cycloSPORINE 0.05 %  ophthalmic emulsion  Commonly known as:  RESTASIS  Place 1 drop into both eyes 2 (two) times daily.     docusate sodium 100 MG capsule  Commonly known as:  COLACE  Take 100 mg by mouth daily.     donepezil 10 MG tablet  Commonly known as:  ARICEPT  Take 10 mg by mouth daily with supper.     enoxaparin 40 MG/0.4ML injection  Commonly known as:  LOVENOX  Inject 0.4 mLs (40 mg total) into the skin daily.     feeding supplement (ENSURE COMPLETE) Liqd  Take 237 mLs by mouth 2 (two) times daily between meals.     furosemide 20 MG tablet  Commonly known as:  LASIX  20 mg three times weekly     HYDROcodone-acetaminophen 5-325 MG per tablet  Commonly known as:  NORCO/VICODIN  Take 1 tablet by mouth every 6 (six) hours as needed for moderate pain.     HYDROcodone-acetaminophen 5-325 MG per tablet  Commonly known as:  NORCO/VICODIN  Take 2 tablets by mouth daily at 8 pm.     HYDROcodone-acetaminophen 5-325 MG per tablet  Commonly known as:  NORCO/VICODIN  Take 1-2 tablets by mouth every 6 (six) hours as needed for moderate pain.     hydrocortisone cream 1 %  Apply topically 2 (two) times daily.     ibandronate 150 MG tablet  Commonly known as:  BONIVA  Take 150 mg by mouth every 30 (thirty) days. Take in the morning with a full glass of water, on an empty stomach, and do not take anything else by mouth or lie down for the next 30 min.; on the 1st Sunday of each month     methenamine 1 G tablet  Commonly known as:  HIPREX  Take 1 g by mouth daily with supper. Crushes in applesauce.     methocarbamol 500 MG tablet  Commonly known as:  ROBAXIN  Take 1 tablet (500 mg total) by mouth 3 (three) times daily.     mirabegron ER 50 MG Tb24 tablet  Commonly known as:  MYRBETRIQ  Take 50 mg by mouth daily.     Polyethyl Glycol-Propyl Glycol 0.4-0.3 % Soln  Commonly known as:  SYSTANE PRESERVATIVE FREE  Place 1 drop into both eyes every 2 (two) hours.     potassium chloride SA 20  MEQ tablet  Commonly known as:  K-DUR,KLOR-CON  20 meq three times weekly     PRESCRIPTION MEDICATION  Place 1 drop into both eyes 4 (four) times daily. Blood Serum Tears compounded at [facility]  MEDICATION  Place 1 drop into both eyes 4 (four) times daily. acetylcysteine eye drops compounded at [facility] (use with hot compress)     RESOURCE THICKENUP CLEAR Powd  Add to all thin liquids to make it nectar thickened before drinking     traMADol-acetaminophen 37.5-325 MG per tablet  Commonly known as:  ULTRACET  Take 1 tablet by mouth every 6 (six) hours as needed.     Travoprost (BAK Free) 0.004 % Soln ophthalmic solution  Commonly known as:  TRAVATAN  Place 1 drop into both eyes at bedtime.         The results of significant diagnostics from this hospitalization (including imaging, microbiology, ancillary and laboratory) are listed below for reference.    Significant Diagnostic Studies: Dg Chest 1 View  05/04/2014   CLINICAL DATA:  Preoperative films history of hypertension and COPD 8  EXAM: CHEST - 1 VIEW  COMPARISON:  02/02/2014  FINDINGS: Multilevel kyphoplasty noted. Heart size upper normal. Vascular pattern normal. Calcification and mild prominence of the aortic arch stable. Lungs clear. Mitral annulus calcification noted.  IMPRESSION: No acute findings   Electronically Signed   By: Skipper Cliche M.D.   On: 05/04/2014 16:48   Dg Hip Complete Left  05/04/2014   CLINICAL DATA:  Trauma and pain.  EXAM: LEFT HIP - COMPLETE 2+ VIEW  COMPARISON:  Pelvic MRI of 07/30/2013.  FINDINGS: Moderate osteopenia. Vascular calcifications. Femoral heads are located. Comminuted intertrochanteric left femur fracture. Mild varus angulation.  IMPRESSION: Comminuted left proximal femur fracture.   Electronically Signed   By: [name] M.D.   On: 05/04/2014 16:06   Dg Hip Operative Left  05/05/2014   CLINICAL DATA:  ORIF LEFT hip  EXAM: OPERATIVE LEFT  HIP  COMPARISON:  Three digital C-arm fluoroscopic images obtained intraoperatively are compared to LEFT hip radiographs of 05/04/2014  FINDINGS: IM nail with compression screw across reduced intertrochanteric fracture of LEFT femur.  Bones appear demineralized.  Distal locking screw present.  No additional fracture or dislocation seen.  IMPRESSION: Post ORIF of intertrochanteric fracture LEFT femur.   Electronically Signed   By: [name] M.D.   On: 05/05/2014 18:57   Dg Ankle Complete Left  05/04/2014   CLINICAL DATA:  Fall.  Left ankle pain.  EXAM: LEFT ANKLE COMPLETE - 3+ VIEW  COMPARISON:  None.  FINDINGS: Bony demineralization. Having material obscures the lateral ankle and lateral part of the proximal foot. Reportedly these were the best obtainable images, but sensitivity for injury is reduced.  Atherosclerosis noted. The lower calcaneus and cuboid are excluded on the lateral projection. No well-defined ankle fracture.  IMPRESSION: 1. Reduced sensitivity due to artifact and difficulty with positioning. No fracture observed. 2. Bony demineralization.   Electronically Signed   By: [name] M.D.   On: 05/04/2014 16:10     Microbiology: Recent Results (from the past 240 hour(s))  MRSA PCR SCREENING     Status: None   Collection Time    05/05/14  4:15 PM      Result Value Ref Range Status   MRSA by PCR NEGATIVE  NEGATIVE Final   Comment:            The GeneXpert MRSA Assay (FDA     approved for NASAL specimens     only), is one component of a     comprehensive MRSA colonization     surveillance program. It is not     intended to diagnose  MRSA     infection nor to guide or     monitor treatment for     MRSA infections.  URINE CULTURE     Status: None   Collection Time    05/06/14  7:41 PM      Result Value Ref Range Status   Specimen Description URINE, CATHETERIZED   Final   Special Requests NONE   Final   Culture  Setup Time     Final   Value: 05/07/2014 01:00      Performed at [facility] Count     Final   Value: NO GROWTH     Performed at [facility]   Culture     Final   Value: NO GROWTH     Performed at [facility]   Report Status 05/08/2014 FINAL   Final     Labs: Basic Metabolic Panel:  Recent Labs Lab 05/07/14 0530 05/08/14 0545 05/09/14 0442 05/10/14 0524 05/11/14 0441  NA 137 137 136* 139 139  K 3.9 3.9 3.7 3.9 3.8  CL 102 102 100 102 103  CO2 25 26 28 29 28   GLUCOSE 127* 122* 109* 115* 113*  BUN 23 19 13 14 17   CREATININE 0.66 0.48* 0.41* 0.47* 0.46*  CALCIUM 8.2* 8.0* 8.0* 8.1* 8.1*   Liver Function Tests: No results found for this basename: AST, ALT, ALKPHOS, BILITOT, PROT, ALBUMIN,  in the last 168 hours No results found for this basename: LIPASE, AMYLASE,  in the last 168 hours No results found for this basename: AMMONIA,  in the last 168 hours CBC:  Recent Labs Lab 05/07/14 0530 05/08/14 0545 05/09/14 0442 05/10/14 0524 05/11/14 0441  WBC 9.9 9.2 7.3 8.2 8.4  HGB 7.6* 10.0* 9.7* 9.8* 10.0*  HCT 23.4* 29.3* 29.0* 29.7* 30.6*  MCV 91.4 88.0 90.3 90.8 91.6  PLT 170 155 180 203 243   Cardiac Enzymes: No results found for this basename: CKTOTAL, CKMB, CKMBINDEX, TROPONINI,  in the last 168 hours BNP: No components found with this basename: POCBNP,  CBG:  Recent Labs Lab 05/08/14 1606  GLUCAP 122*    Time coordinating discharge:  Greater than 30 minutes  Signed:  Loann Chahal, DO [facility] Pager: [phone] 05/12/2014, 1:29 PM

## 2014-05-22 ENCOUNTER — Telehealth: Payer: Self-pay | Admitting: Internal Medicine

## 2014-05-22 NOTE — Telephone Encounter (Signed)
Vicky called back and states their phone line go off at 5 and would like a callback before 5.  They would like to evaluate the patient on Sunday by Sunday.

## 2014-05-22 NOTE — Telephone Encounter (Signed)
daughter is bringing pt home from [facility] today. Not getting good care there. Would like to know if you can call hospice w/ orders today prior to 5 oclock.

## 2014-05-22 NOTE — Telephone Encounter (Signed)
Hospice would like to know if Dr. Regis Bill will be pt's attending and give orders, if pt is eligible for hospice??  Vicky said it's okay to leave information on her voicemail if needed.

## 2014-05-22 NOTE — Telephone Encounter (Signed)
Per [facility], she will be attending with the exception of pain management.  Spoke to [place] [facility] and informed her.  [name] will have one of the hospice physicians do pain management.  [name] stated she will send a nurse out on Sunday to perform an evaluation.

## 2014-05-22 NOTE — Telephone Encounter (Signed)
Jocelyn Lamer called back and left another # to try [phone] if it is after 5PM.

## 2014-06-01 ENCOUNTER — Telehealth: Payer: Self-pay | Admitting: Internal Medicine

## 2014-06-01 NOTE — Telephone Encounter (Signed)
Need more information  Cant answer this message based on the information given  Please call and find out more.  We were going to have hospice also help with pain management.

## 2014-06-01 NOTE — Telephone Encounter (Signed)
[name] w/[facility] called and wanted you to know that the patient had increase agitation over the weekend and they started her on HALDOL 2mg  every four hours as needed. She also wanted to know the plan for her pain mgmt, specifically about her FENTANYL patch.

## 2014-06-03 NOTE — Telephone Encounter (Signed)
Spoke to [place].  Advised that Dr. Regis Bill would like assistance from the hospice doctor with patient's pain management.  Hospice doctor will take over but keep [facility] informed of any medication changes.

## 2014-06-03 NOTE — Telephone Encounter (Signed)
LMOM for Arbie Cookey to return my call.

## 2014-06-05 ENCOUNTER — Telehealth: Payer: Self-pay | Admitting: Internal Medicine

## 2014-06-05 NOTE — Telephone Encounter (Signed)
[name] w/ hospice would like you to know:  Pt died peacefully in her home today, fri Oct 2 at 9:15pm.

## 2014-06-08 NOTE — Telephone Encounter (Signed)
Spoke with daughter October 2

## 2014-06-19 ENCOUNTER — Other Ambulatory Visit: Payer: Self-pay

## 2014-07-05 DEATH — deceased

## 2014-07-06 ENCOUNTER — Encounter (HOSPITAL_COMMUNITY): Payer: Self-pay | Admitting: Orthopedic Surgery

## 2014-11-19 ENCOUNTER — Telehealth: Payer: Self-pay | Admitting: Family Medicine

## 2014-11-19 NOTE — Telephone Encounter (Signed)
This patient is deceased.  Please update her chart.  Thanks!

## 2015-08-20 IMAGING — CR DG ANKLE COMPLETE 3+V*L*
3 series · 3 of 3 positions shown · non-contrast
Comparison: None.

CLINICAL DATA: Fall.  Left ankle pain.

EXAM:
LEFT ANKLE COMPLETE - 3+ VIEW

[x ankle lat left]
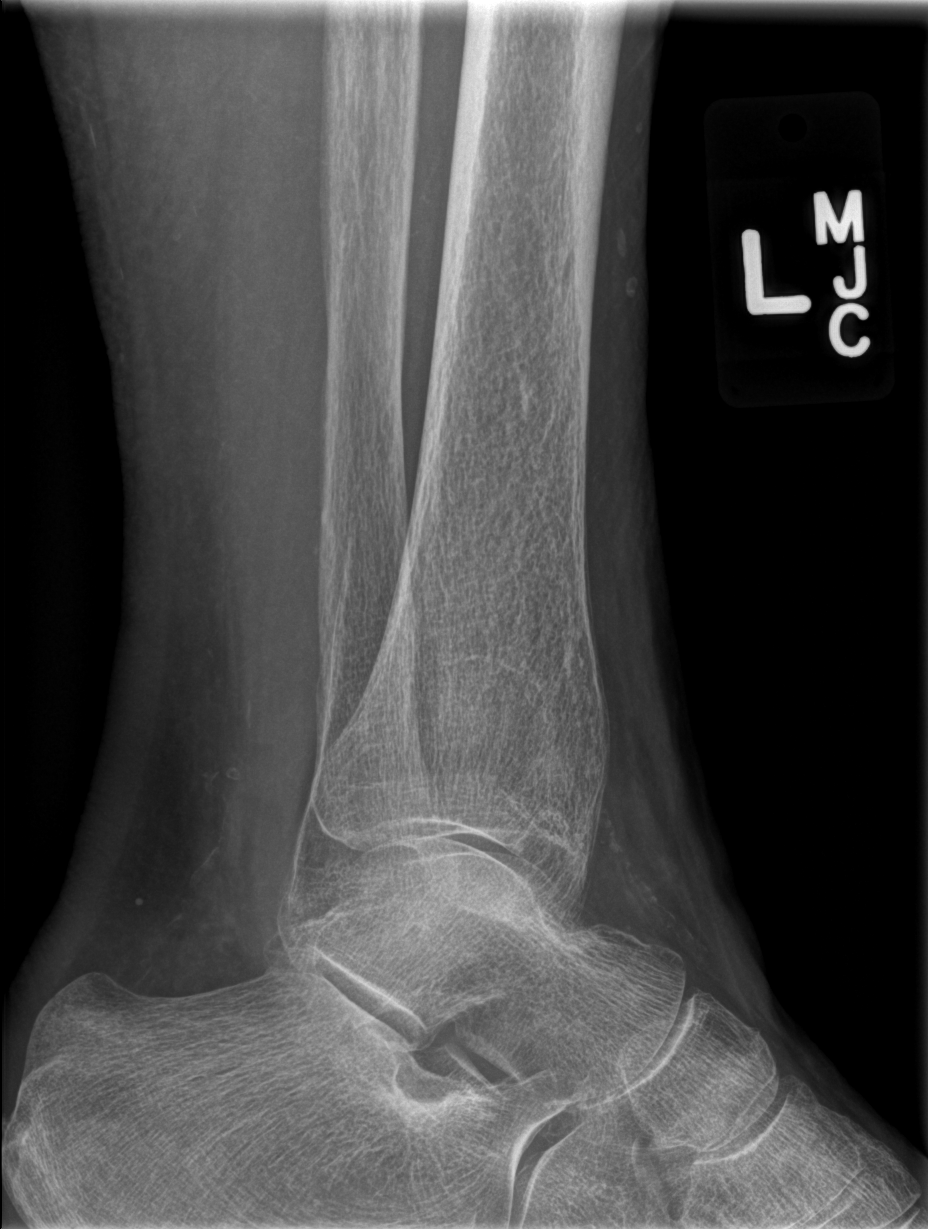

[x ankle ap left]
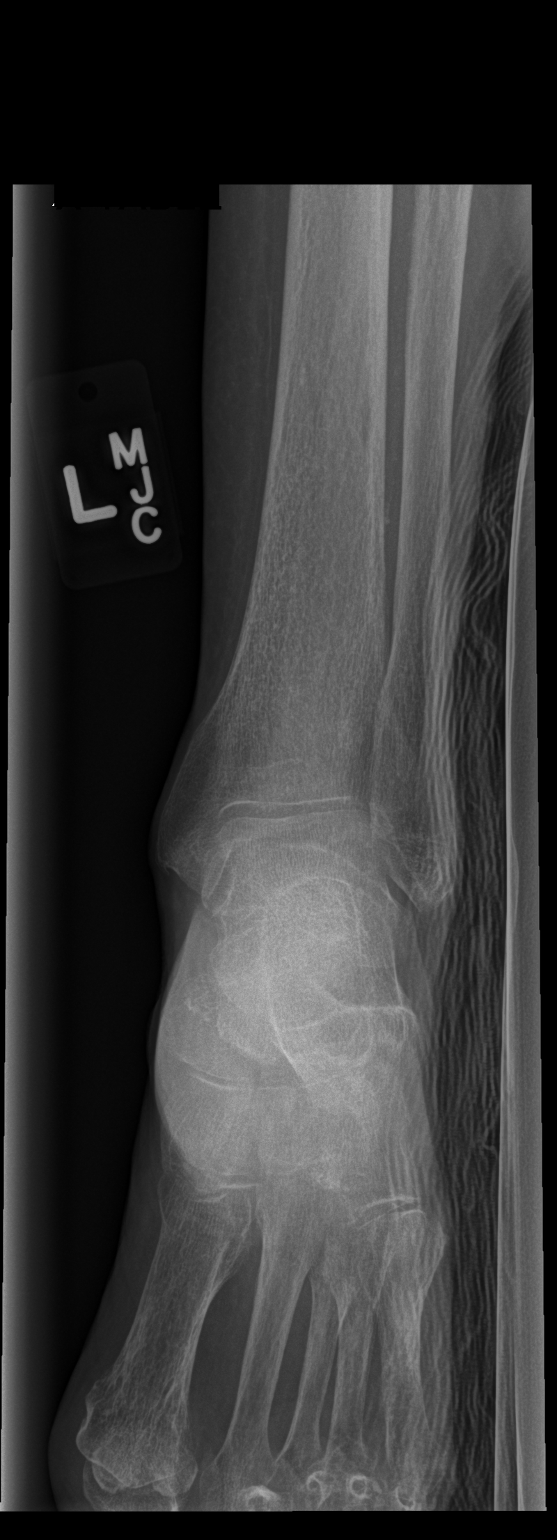

[x ankle left 0-3yrs]
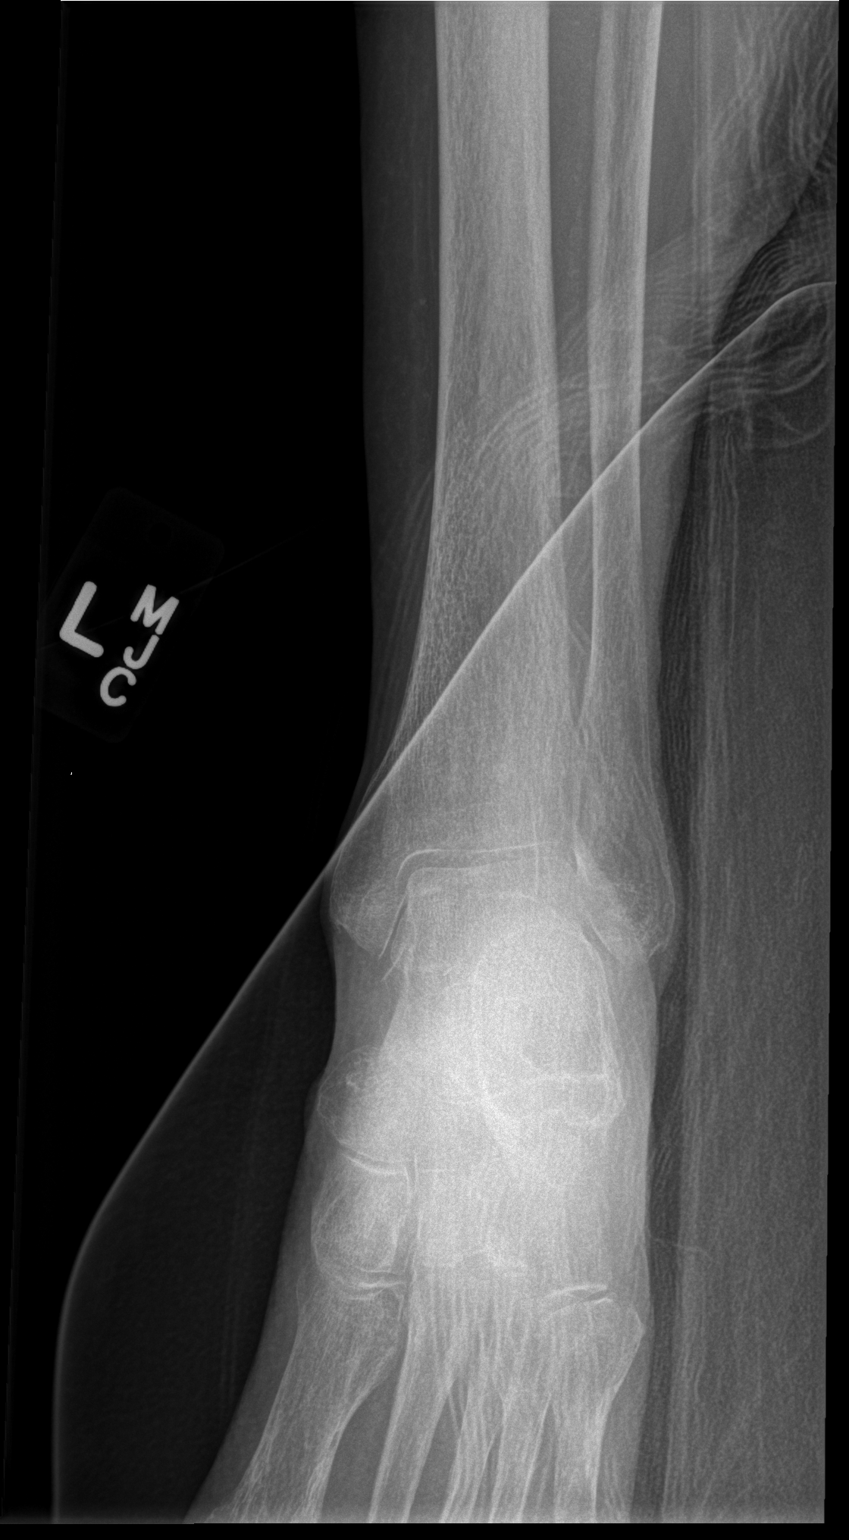

[3 of 3 positions shown; findings below may reference images not displayed]

FINDINGS: Bony demineralization. Having material obscures the lateral ankle
and lateral part of the proximal foot. Reportedly these were the
best obtainable images, but sensitivity for injury is reduced.

Atherosclerosis noted. The lower calcaneus and cuboid are excluded
on the lateral projection. No well-defined ankle fracture.
IMPRESSION: 1. Reduced sensitivity due to artifact and difficulty with
positioning. No fracture observed.
2. Bony demineralization.
